# Patient Record
Sex: Female | Born: 1945 | Race: White | Hispanic: No | Marital: Married | Smoking: Never smoker
Health system: Southern US, Community
[De-identification: ages and names within clinical notes are randomized; demographics above are authoritative.]

## PROBLEM LIST (undated history)

## (undated) DIAGNOSIS — F32A Depression, unspecified: Secondary | ICD-10-CM

## (undated) DIAGNOSIS — F329 Major depressive disorder, single episode, unspecified: Secondary | ICD-10-CM

## (undated) DIAGNOSIS — S060X9A Concussion with loss of consciousness of unspecified duration, initial encounter: Secondary | ICD-10-CM

## (undated) DIAGNOSIS — C801 Malignant (primary) neoplasm, unspecified: Secondary | ICD-10-CM

## (undated) DIAGNOSIS — F4310 Post-traumatic stress disorder, unspecified: Secondary | ICD-10-CM

## (undated) DIAGNOSIS — I639 Cerebral infarction, unspecified: Secondary | ICD-10-CM

## (undated) DIAGNOSIS — I10 Essential (primary) hypertension: Secondary | ICD-10-CM

## (undated) DIAGNOSIS — G629 Polyneuropathy, unspecified: Secondary | ICD-10-CM

## (undated) DIAGNOSIS — G709 Myoneural disorder, unspecified: Secondary | ICD-10-CM

## (undated) DIAGNOSIS — E119 Type 2 diabetes mellitus without complications: Secondary | ICD-10-CM

## (undated) DIAGNOSIS — F419 Anxiety disorder, unspecified: Secondary | ICD-10-CM

## (undated) DIAGNOSIS — K219 Gastro-esophageal reflux disease without esophagitis: Secondary | ICD-10-CM

## (undated) DIAGNOSIS — A4902 Methicillin resistant Staphylococcus aureus infection, unspecified site: Secondary | ICD-10-CM

## (undated) DIAGNOSIS — R112 Nausea with vomiting, unspecified: Secondary | ICD-10-CM

## (undated) DIAGNOSIS — N189 Chronic kidney disease, unspecified: Secondary | ICD-10-CM

## (undated) DIAGNOSIS — Z9889 Other specified postprocedural states: Secondary | ICD-10-CM

## (undated) DIAGNOSIS — S060XAA Concussion with loss of consciousness status unknown, initial encounter: Secondary | ICD-10-CM

## (undated) DIAGNOSIS — J189 Pneumonia, unspecified organism: Secondary | ICD-10-CM

## (undated) DIAGNOSIS — M1990 Unspecified osteoarthritis, unspecified site: Secondary | ICD-10-CM

## (undated) DIAGNOSIS — K589 Irritable bowel syndrome without diarrhea: Secondary | ICD-10-CM

## (undated) DIAGNOSIS — J45909 Unspecified asthma, uncomplicated: Secondary | ICD-10-CM

## (undated) DIAGNOSIS — T8859XA Other complications of anesthesia, initial encounter: Secondary | ICD-10-CM

## (undated) DIAGNOSIS — T4145XA Adverse effect of unspecified anesthetic, initial encounter: Secondary | ICD-10-CM

## (undated) HISTORY — PX: CATARACT EXTRACTION: SUR2

## (undated) HISTORY — PX: ROTATOR CUFF REPAIR: SHX139

## (undated) HISTORY — PX: CARPAL TUNNEL RELEASE: SHX101

## (undated) HISTORY — PX: CHOLECYSTECTOMY: SHX55

## (undated) HISTORY — PX: SALPINGOOPHORECTOMY: SHX82

## (undated) HISTORY — PX: TRIGGER FINGER RELEASE: SHX641

## (undated) HISTORY — PX: OVARIAN CYST SURGERY: SHX726

## (undated) HISTORY — PX: DILATION AND CURETTAGE OF UTERUS: SHX78

## (undated) HISTORY — PX: ABDOMINAL HYSTERECTOMY: SHX81

## (undated) HISTORY — PX: TUBAL LIGATION: SHX77

---

## 2013-01-20 ENCOUNTER — Encounter (HOSPITAL_COMMUNITY): Payer: Self-pay | Admitting: Pharmacy Technician

## 2013-01-21 NOTE — H&P (Signed)
  Whitney [name] is an 67 y.o. female.    Chief Complaint: right shoulder pain/weakness  HPI: Pt is a 67 y.o. female complaining of right shoulder pain for multiple months. Pain had continually increased since the beginning. X-rays in the clinic show right shoulder rotator cuff tear. Pt has tried various conservative treatments which have failed to alleviate their symptoms, including injections and therapy. Various options are discussed with the patient. Risks, benefits and expectations were discussed with the patient. Patient understand the risks, benefits and expectations and wishes to proceed with surgery.   PCP:  No primary provider on file.  D/C Plans:  Home   PMH: No past medical history on file.  PSH: No past surgical history on file.  Social History:  has no tobacco, alcohol, and drug history on file.  Allergies:  Allergies  Allergen Reactions  . Codeine Nausea And Vomiting and Other (See Comments)    disorientation  . Erythromycin Hives  . Morphine And Related Rash    Medications: No current facility-administered medications for this encounter.   Current Outpatient Prescriptions  Medication Sig Dispense Refill  . albuterol (PROVENTIL HFA;VENTOLIN HFA) 108 (90 BASE) MCG/ACT inhaler Inhale into the lungs every 6 (six) hours as needed for wheezing or shortness of breath.      Marland Kitchen CALCIUM PO Take 1 tablet by mouth daily.      . celecoxib (CELEBREX) 200 MG capsule Take 200 mg by mouth daily.      . Cholecalciferol (VITAMIN D PO) Take 1 tablet by mouth daily.      . citalopram (CELEXA) 20 MG tablet Take 20 mg by mouth daily.      . diclofenac sodium (VOLTAREN) 1 % GEL Apply 2 g topically 4 (four) times daily as needed (for pain).      . Fish Oil-Krill Oil (KRILL OIL PLUS PO) Take 1 capsule by mouth daily.      . insulin aspart (NOVOLOG) 100 UNIT/ML injection Inject 20 Units into the skin daily.      . insulin glargine (LANTUS) 100 UNIT/ML injection Inject 110 Units into the  skin at bedtime.      Marland Kitchen lisinopril-hydrochlorothiazide (PRINZIDE,ZESTORETIC) 20-12.5 MG per tablet Take 1 tablet by mouth daily.      Marland Kitchen omeprazole (PRILOSEC) 40 MG capsule Take 40 mg by mouth daily.      . pioglitazone-metformin (ACTOPLUS MET) 15-850 MG per tablet Take 1 tablet by mouth 2 (two) times daily with a meal.      . rosuvastatin (CRESTOR) 20 MG tablet Take 20 mg by mouth daily.        No results found for this or any previous visit (from the past 48 hour(s)). No results found.  ROS: Pain with rom of the right upper extremity  Physical Exam:  Alert and oriented 67 y.o. female in no acute distress Cranial nerves 2-12 intact Cervical spine: full rom with no tenderness, nv intact distally Chest: active breath sounds bilaterally, no wheeze rhonchi or rales Heart: regular rate and rhythm, no murmur Abd: non tender non distended with active bowel sounds Hip is stable with rom  Moderate weakness with ER and IR nv intact distally No rashes or edema  Assessment/Plan Assessment: right shoulder rotator cuff tear   Plan: Patient will undergo a right shoulder rotator cuff repair by Dr. Veverly Fells at [facility]. Risks benefits and expectations were discussed with the patient. Patient understand risks, benefits and expectations and wishes to proceed.

## 2013-01-30 ENCOUNTER — Encounter (HOSPITAL_COMMUNITY): Payer: Self-pay | Admitting: *Deleted

## 2013-01-30 MED ORDER — CEFAZOLIN SODIUM-DEXTROSE 2-3 GM-% IV SOLR
2.0000 g | INTRAVENOUS | Status: AC
  Administered 2013-01-31: 2 g via INTRAVENOUS
  Filled 2013-01-30: qty 50

## 2013-01-31 ENCOUNTER — Encounter (HOSPITAL_COMMUNITY): Payer: Self-pay | Admitting: *Deleted

## 2013-01-31 ENCOUNTER — Observation Stay (HOSPITAL_COMMUNITY)
Admission: RE | Admit: 2013-01-31 | Discharge: 2013-02-02 | Disposition: A | Discharge status: Home / Self Care | Payer: Medicare HMO | Source: Ambulatory Visit | Attending: Orthopedic Surgery | Admitting: Orthopedic Surgery

## 2013-01-31 ENCOUNTER — Encounter (HOSPITAL_COMMUNITY)
Admission: RE | Disposition: A | Discharge status: Home / Self Care | Payer: Self-pay | Source: Ambulatory Visit | Attending: Orthopedic Surgery

## 2013-01-31 ENCOUNTER — Ambulatory Visit (HOSPITAL_COMMUNITY): Payer: Medicare HMO | Admitting: Anesthesiology

## 2013-01-31 ENCOUNTER — Encounter (HOSPITAL_COMMUNITY): Payer: Medicare HMO | Admitting: Anesthesiology

## 2013-01-31 DIAGNOSIS — I10 Essential (primary) hypertension: Secondary | ICD-10-CM | POA: Insufficient documentation

## 2013-01-31 DIAGNOSIS — M19019 Primary osteoarthritis, unspecified shoulder: Secondary | ICD-10-CM | POA: Insufficient documentation

## 2013-01-31 DIAGNOSIS — J45909 Unspecified asthma, uncomplicated: Secondary | ICD-10-CM | POA: Insufficient documentation

## 2013-01-31 DIAGNOSIS — F4310 Post-traumatic stress disorder, unspecified: Secondary | ICD-10-CM | POA: Insufficient documentation

## 2013-01-31 DIAGNOSIS — F411 Generalized anxiety disorder: Secondary | ICD-10-CM | POA: Insufficient documentation

## 2013-01-31 DIAGNOSIS — S43429A Sprain of unspecified rotator cuff capsule, initial encounter: Secondary | ICD-10-CM

## 2013-01-31 DIAGNOSIS — Z794 Long term (current) use of insulin: Secondary | ICD-10-CM | POA: Insufficient documentation

## 2013-01-31 DIAGNOSIS — E119 Type 2 diabetes mellitus without complications: Secondary | ICD-10-CM | POA: Insufficient documentation

## 2013-01-31 DIAGNOSIS — F3289 Other specified depressive episodes: Secondary | ICD-10-CM | POA: Insufficient documentation

## 2013-01-31 DIAGNOSIS — M75120 Complete rotator cuff tear or rupture of unspecified shoulder, not specified as traumatic: Principal | ICD-10-CM | POA: Insufficient documentation

## 2013-01-31 DIAGNOSIS — K219 Gastro-esophageal reflux disease without esophagitis: Secondary | ICD-10-CM | POA: Insufficient documentation

## 2013-01-31 DIAGNOSIS — F329 Major depressive disorder, single episode, unspecified: Secondary | ICD-10-CM | POA: Insufficient documentation

## 2013-01-31 DIAGNOSIS — Z5333 Arthroscopic surgical procedure converted to open procedure: Secondary | ICD-10-CM | POA: Insufficient documentation

## 2013-01-31 HISTORY — DX: Major depressive disorder, single episode, unspecified: F32.9

## 2013-01-31 HISTORY — DX: Cerebral infarction, unspecified: I63.9

## 2013-01-31 HISTORY — DX: Polyneuropathy, unspecified: G62.9

## 2013-01-31 HISTORY — DX: Anxiety disorder, unspecified: F41.9

## 2013-01-31 HISTORY — DX: Depression, unspecified: F32.A

## 2013-01-31 HISTORY — DX: Essential (primary) hypertension: I10

## 2013-01-31 HISTORY — DX: Type 2 diabetes mellitus without complications: E11.9

## 2013-01-31 HISTORY — DX: Concussion with loss of consciousness status unknown, initial encounter: S06.0XAA

## 2013-01-31 HISTORY — DX: Methicillin resistant Staphylococcus aureus infection, unspecified site: A49.02

## 2013-01-31 HISTORY — DX: Other specified postprocedural states: Z98.890

## 2013-01-31 HISTORY — DX: Malignant (primary) neoplasm, unspecified: C80.1

## 2013-01-31 HISTORY — DX: Post-traumatic stress disorder, unspecified: F43.10

## 2013-01-31 HISTORY — DX: Other complications of anesthesia, initial encounter: T88.59XA

## 2013-01-31 HISTORY — DX: Concussion with loss of consciousness of unspecified duration, initial encounter: S06.0X9A

## 2013-01-31 HISTORY — DX: Adverse effect of unspecified anesthetic, initial encounter: T41.45XA

## 2013-01-31 HISTORY — DX: Irritable bowel syndrome, unspecified: K58.9

## 2013-01-31 HISTORY — PX: SHOULDER ARTHROSCOPY WITH SUBACROMIAL DECOMPRESSION AND OPEN ROTATOR C: SHX5688

## 2013-01-31 HISTORY — DX: Other specified postprocedural states: R11.2

## 2013-01-31 HISTORY — DX: Gastro-esophageal reflux disease without esophagitis: K21.9

## 2013-01-31 LAB — NO BLOOD PRODUCTS

## 2013-01-31 LAB — GLUCOSE, CAPILLARY
GLUCOSE-CAPILLARY: 164 mg/dL — AB (ref 70–99)
Glucose-Capillary: 189 mg/dL — ABNORMAL HIGH (ref 70–99)
Glucose-Capillary: 190 mg/dL — ABNORMAL HIGH (ref 70–99)
Glucose-Capillary: 287 mg/dL — ABNORMAL HIGH (ref 70–99)

## 2013-01-31 LAB — BASIC METABOLIC PANEL
BUN: 23 mg/dL (ref 6–23)
CALCIUM: 9.8 mg/dL (ref 8.4–10.5)
CHLORIDE: 100 meq/L (ref 96–112)
CO2: 24 meq/L (ref 19–32)
CREATININE: 0.96 mg/dL (ref 0.50–1.10)
GFR calc Af Amer: 69 mL/min — ABNORMAL LOW (ref >90)
GFR calc non Af Amer: 59 mL/min — ABNORMAL LOW (ref >90)
GLUCOSE: 198 mg/dL — AB (ref 70–99)
Potassium: 4.4 mEq/L (ref 3.7–5.3)
Sodium: 140 mEq/L (ref 137–147)

## 2013-01-31 LAB — CBC
HEMATOCRIT: 41.2 % (ref 36.0–46.0)
Hemoglobin: 14 g/dL (ref 12.0–15.0)
MCH: 30.2 pg (ref 26.0–34.0)
MCHC: 34 g/dL (ref 30.0–36.0)
MCV: 89 fL (ref 78.0–100.0)
PLATELETS: 151 10*3/uL (ref 150–400)
RBC: 4.63 MIL/uL (ref 3.87–5.11)
RDW: 13.2 % (ref 11.5–15.5)
WBC: 5.8 10*3/uL (ref 4.0–10.5)

## 2013-01-31 SURGERY — SHOULDER ARTHROSCOPY WITH SUBACROMIAL DECOMPRESSION AND OPEN ROTATOR CUFF REPAIR, OPEN BICEPS TENDON REPAIR
Anesthesia: General | Site: Shoulder | Laterality: Right

## 2013-01-31 MED ORDER — ACETAMINOPHEN 650 MG RE SUPP: 650.0000 mg | Freq: Four times a day (QID) | RECTAL | Status: DC | PRN

## 2013-01-31 MED ORDER — ATORVASTATIN CALCIUM 40 MG PO TABS
40.0000 mg | ORAL_TABLET | Freq: Every day | ORAL | Status: DC
  Administered 2013-01-31 – 2013-02-01 (×2): 40 mg via ORAL
  Filled 2013-01-31 (×3): qty 1

## 2013-01-31 MED ORDER — ROCURONIUM BROMIDE 100 MG/10ML IV SOLN
INTRAVENOUS | Status: DC | PRN
  Administered 2013-01-31: 40 mg via INTRAVENOUS

## 2013-01-31 MED ORDER — METHOCARBAMOL 500 MG PO TABS: 500.0000 mg | ORAL_TABLET | Freq: Three times a day (TID) | ORAL | Status: DC | PRN

## 2013-01-31 MED ORDER — ALBUTEROL SULFATE HFA 108 (90 BASE) MCG/ACT IN AERS: 1.0000 | INHALATION_SPRAY | Freq: Four times a day (QID) | RESPIRATORY_TRACT | Status: DC | PRN

## 2013-01-31 MED ORDER — HYDROMORPHONE HCL PF 1 MG/ML IJ SOLN: 0.2500 mg | INTRAMUSCULAR | Status: DC | PRN

## 2013-01-31 MED ORDER — MENTHOL 3 MG MT LOZG: 1.0000 | LOZENGE | OROMUCOSAL | Status: DC | PRN

## 2013-01-31 MED ORDER — BUPIVACAINE-EPINEPHRINE PF 0.5-1:200000 % IJ SOLN
INTRAMUSCULAR | Status: DC | PRN
  Administered 2013-01-31: 30 mL

## 2013-01-31 MED ORDER — SCOPOLAMINE 1 MG/3DAYS TD PT72
MEDICATED_PATCH | TRANSDERMAL | Status: AC
  Filled 2013-01-31: qty 1

## 2013-01-31 MED ORDER — ONDANSETRON HCL 4 MG/2ML IJ SOLN: 4.0000 mg | Freq: Four times a day (QID) | INTRAMUSCULAR | Status: DC | PRN

## 2013-01-31 MED ORDER — METOCLOPRAMIDE HCL 5 MG/ML IJ SOLN: 5.0000 mg | Freq: Three times a day (TID) | INTRAMUSCULAR | Status: DC | PRN

## 2013-01-31 MED ORDER — FENTANYL CITRATE 0.05 MG/ML IJ SOLN
INTRAMUSCULAR | Status: DC | PRN
  Administered 2013-01-31: 50 ug via INTRAVENOUS
  Administered 2013-01-31: 100 ug via INTRAVENOUS

## 2013-01-31 MED ORDER — EPHEDRINE SULFATE 50 MG/ML IJ SOLN
INTRAMUSCULAR | Status: DC | PRN
  Administered 2013-01-31 (×2): 10 mg via INTRAVENOUS

## 2013-01-31 MED ORDER — PROMETHAZINE HCL 25 MG/ML IJ SOLN: 6.2500 mg | INTRAMUSCULAR | Status: DC | PRN

## 2013-01-31 MED ORDER — OXYCODONE HCL 5 MG/5ML PO SOLN: 5.0000 mg | Freq: Once | ORAL | Status: DC | PRN

## 2013-01-31 MED ORDER — MIDAZOLAM HCL 2 MG/2ML IJ SOLN
INTRAMUSCULAR | Status: AC
  Administered 2013-01-31: 1 mg
  Filled 2013-01-31: qty 2

## 2013-01-31 MED ORDER — SODIUM CHLORIDE 0.9 % IR SOLN
Status: DC | PRN
  Administered 2013-01-31: 3000 mL

## 2013-01-31 MED ORDER — MEPERIDINE HCL 25 MG/ML IJ SOLN: 6.2500 mg | INTRAMUSCULAR | Status: DC | PRN

## 2013-01-31 MED ORDER — OXYCODONE-ACETAMINOPHEN 5-325 MG PO TABS
1.0000 | ORAL_TABLET | ORAL | Status: DC | PRN
  Administered 2013-01-31 – 2013-02-01 (×3): 2 via ORAL
  Administered 2013-02-01: 1 via ORAL
  Administered 2013-02-01 – 2013-02-02 (×2): 2 via ORAL
  Administered 2013-02-02: 1 via ORAL
  Filled 2013-01-31 (×3): qty 2
  Filled 2013-01-31: qty 1
  Filled 2013-01-31 (×2): qty 2
  Filled 2013-01-31: qty 1

## 2013-01-31 MED ORDER — PROPOFOL 10 MG/ML IV BOLUS
INTRAVENOUS | Status: DC | PRN
  Administered 2013-01-31: 150 mg via INTRAVENOUS

## 2013-01-31 MED ORDER — INSULIN ASPART 100 UNIT/ML ~~LOC~~ SOLN: 0.0000 [IU] | Freq: Every day | SUBCUTANEOUS | Status: DC

## 2013-01-31 MED ORDER — INSULIN ASPART 100 UNIT/ML ~~LOC~~ SOLN
6.0000 [IU] | Freq: Three times a day (TID) | SUBCUTANEOUS | Status: DC
  Administered 2013-01-31 – 2013-02-02 (×4): 6 [IU] via SUBCUTANEOUS

## 2013-01-31 MED ORDER — MIDAZOLAM HCL 5 MG/5ML IJ SOLN
INTRAMUSCULAR | Status: DC | PRN
  Administered 2013-01-31: 1 mg via INTRAVENOUS

## 2013-01-31 MED ORDER — METHOCARBAMOL 100 MG/ML IJ SOLN
500.0000 mg | Freq: Four times a day (QID) | INTRAMUSCULAR | Status: DC | PRN
  Filled 2013-01-31: qty 5

## 2013-01-31 MED ORDER — ONDANSETRON HCL 4 MG PO TABS: 4.0000 mg | ORAL_TABLET | Freq: Four times a day (QID) | ORAL | Status: DC | PRN

## 2013-01-31 MED ORDER — 0.9 % SODIUM CHLORIDE (POUR BTL) OPTIME
TOPICAL | Status: DC | PRN
  Administered 2013-01-31: 1000 mL

## 2013-01-31 MED ORDER — GLYCOPYRROLATE 0.2 MG/ML IJ SOLN
INTRAMUSCULAR | Status: DC | PRN
  Administered 2013-01-31: .4 mg via INTRAVENOUS

## 2013-01-31 MED ORDER — FENTANYL CITRATE 0.05 MG/ML IJ SOLN
INTRAMUSCULAR | Status: AC
  Administered 2013-01-31: 50 ug
  Filled 2013-01-31: qty 2

## 2013-01-31 MED ORDER — INSULIN ASPART 100 UNIT/ML ~~LOC~~ SOLN: 20.0000 [IU] | Freq: Every day | SUBCUTANEOUS | Status: DC

## 2013-01-31 MED ORDER — SODIUM CHLORIDE 0.9 % IV SOLN
INTRAVENOUS | Status: DC
  Administered 2013-01-31: 50 mL/h via INTRAVENOUS

## 2013-01-31 MED ORDER — FENTANYL CITRATE 0.05 MG/ML IJ SOLN: 50.0000 ug | INTRAMUSCULAR | Status: DC | PRN

## 2013-01-31 MED ORDER — BUPIVACAINE-EPINEPHRINE 0.25% -1:200000 IJ SOLN
INTRAMUSCULAR | Status: DC | PRN
  Administered 2013-01-31: 10 mL

## 2013-01-31 MED ORDER — BUPIVACAINE-EPINEPHRINE (PF) 0.25% -1:200000 IJ SOLN
INTRAMUSCULAR | Status: AC
  Filled 2013-01-31: qty 30

## 2013-01-31 MED ORDER — HYDROMORPHONE HCL PF 1 MG/ML IJ SOLN
0.5000 mg | INTRAMUSCULAR | Status: DC | PRN
  Administered 2013-01-31 – 2013-02-01 (×3): 1 mg via INTRAVENOUS
  Filled 2013-01-31 (×3): qty 1

## 2013-01-31 MED ORDER — LACTATED RINGERS IV SOLN
INTRAVENOUS | Status: DC
  Administered 2013-01-31 (×2): via INTRAVENOUS

## 2013-01-31 MED ORDER — VANCOMYCIN HCL IN DEXTROSE 1-5 GM/200ML-% IV SOLN
INTRAVENOUS | Status: AC
  Filled 2013-01-31: qty 200

## 2013-01-31 MED ORDER — NEOSTIGMINE METHYLSULFATE 1 MG/ML IJ SOLN
INTRAMUSCULAR | Status: DC | PRN
  Administered 2013-01-31: 3 mg via INTRAVENOUS

## 2013-01-31 MED ORDER — PHENOL 1.4 % MT LIQD: 1.0000 | OROMUCOSAL | Status: DC | PRN

## 2013-01-31 MED ORDER — WHITE PETROLATUM GEL
Status: AC
  Administered 2013-01-31: 0.2
  Filled 2013-01-31: qty 5

## 2013-01-31 MED ORDER — PIOGLITAZONE HCL 15 MG PO TABS
15.0000 mg | ORAL_TABLET | Freq: Two times a day (BID) | ORAL | Status: DC
  Administered 2013-01-31 – 2013-02-02 (×4): 15 mg via ORAL
  Filled 2013-01-31 (×6): qty 1

## 2013-01-31 MED ORDER — METHOCARBAMOL 500 MG PO TABS
500.0000 mg | ORAL_TABLET | Freq: Four times a day (QID) | ORAL | Status: DC | PRN
  Administered 2013-01-31 – 2013-02-02 (×4): 500 mg via ORAL
  Filled 2013-01-31 (×4): qty 1

## 2013-01-31 MED ORDER — INSULIN ASPART 100 UNIT/ML ~~LOC~~ SOLN
0.0000 [IU] | Freq: Three times a day (TID) | SUBCUTANEOUS | Status: DC
  Administered 2013-01-31: 11 [IU] via SUBCUTANEOUS
  Administered 2013-02-01: 4 [IU] via SUBCUTANEOUS
  Administered 2013-02-01 – 2013-02-02 (×3): 11 [IU] via SUBCUTANEOUS
  Administered 2013-02-02: 7 [IU] via SUBCUTANEOUS

## 2013-01-31 MED ORDER — PNEUMOCOCCAL VAC POLYVALENT 25 MCG/0.5ML IJ INJ
0.5000 mL | INJECTION | INTRAMUSCULAR | Status: DC
  Filled 2013-01-31: qty 0.5

## 2013-01-31 MED ORDER — CITALOPRAM HYDROBROMIDE 20 MG PO TABS
20.0000 mg | ORAL_TABLET | Freq: Every day | ORAL | Status: DC
  Administered 2013-01-31 – 2013-02-02 (×3): 20 mg via ORAL
  Filled 2013-01-31 (×3): qty 1

## 2013-01-31 MED ORDER — CHLORHEXIDINE GLUCONATE 4 % EX LIQD: 60.0000 mL | Freq: Once | CUTANEOUS | Status: DC

## 2013-01-31 MED ORDER — PIOGLITAZONE HCL-METFORMIN HCL 15-850 MG PO TABS: 1.0000 | ORAL_TABLET | Freq: Two times a day (BID) | ORAL | Status: DC

## 2013-01-31 MED ORDER — ONDANSETRON HCL 4 MG/2ML IJ SOLN
INTRAMUSCULAR | Status: DC | PRN
  Administered 2013-01-31: 4 mg via INTRAVENOUS

## 2013-01-31 MED ORDER — MIDAZOLAM HCL 2 MG/2ML IJ SOLN: 0.5000 mg | Freq: Once | INTRAMUSCULAR | Status: DC | PRN

## 2013-01-31 MED ORDER — BISACODYL 10 MG RE SUPP: 10.0000 mg | Freq: Every day | RECTAL | Status: DC | PRN

## 2013-01-31 MED ORDER — PHENYLEPHRINE HCL 10 MG/ML IJ SOLN
INTRAMUSCULAR | Status: DC | PRN
  Administered 2013-01-31: 120 ug via INTRAVENOUS
  Administered 2013-01-31: 40 ug via INTRAVENOUS
  Administered 2013-01-31 (×3): 80 ug via INTRAVENOUS
  Administered 2013-01-31: 120 ug via INTRAVENOUS
  Administered 2013-01-31: 80 ug via INTRAVENOUS
  Administered 2013-01-31: 40 ug via INTRAVENOUS
  Administered 2013-01-31 (×2): 80 ug via INTRAVENOUS

## 2013-01-31 MED ORDER — MIDAZOLAM HCL 2 MG/2ML IJ SOLN
1.0000 mg | INTRAMUSCULAR | Status: DC | PRN
Start: 2013-01-31 — End: 2013-01-31

## 2013-01-31 MED ORDER — VANCOMYCIN HCL 1000 MG IV SOLR
1000.0000 mg | Freq: Two times a day (BID) | INTRAVENOUS | Status: DC
  Administered 2013-01-31: 1000 mg via INTRAVENOUS

## 2013-01-31 MED ORDER — PANTOPRAZOLE SODIUM 40 MG PO TBEC
80.0000 mg | DELAYED_RELEASE_TABLET | Freq: Every day | ORAL | Status: DC
  Administered 2013-01-31 – 2013-02-02 (×3): 80 mg via ORAL
  Filled 2013-01-31 (×3): qty 2

## 2013-01-31 MED ORDER — OXYCODONE HCL 5 MG PO TABS: 5.0000 mg | ORAL_TABLET | Freq: Once | ORAL | Status: DC | PRN

## 2013-01-31 MED ORDER — LISINOPRIL-HYDROCHLOROTHIAZIDE 20-12.5 MG PO TABS: 1.0000 | ORAL_TABLET | Freq: Every day | ORAL | Status: DC

## 2013-01-31 MED ORDER — SCOPOLAMINE 1 MG/3DAYS TD PT72
1.0000 | MEDICATED_PATCH | TRANSDERMAL | Status: DC
  Administered 2013-01-31: 1.5 mg via TRANSDERMAL

## 2013-01-31 MED ORDER — VANCOMYCIN HCL 1000 MG IV SOLR
1000.0000 mg | Freq: Once | INTRAVENOUS | Status: DC
  Filled 2013-01-31: qty 1000

## 2013-01-31 MED ORDER — LISINOPRIL 20 MG PO TABS
20.0000 mg | ORAL_TABLET | Freq: Every day | ORAL | Status: DC
  Administered 2013-02-01 – 2013-02-02 (×2): 20 mg via ORAL
  Filled 2013-01-31 (×2): qty 1

## 2013-01-31 MED ORDER — OXYCODONE-ACETAMINOPHEN 5-325 MG PO TABS: 1.0000 | ORAL_TABLET | ORAL | Status: DC | PRN

## 2013-01-31 MED ORDER — HYDROCHLOROTHIAZIDE 12.5 MG PO CAPS
12.5000 mg | ORAL_CAPSULE | Freq: Every day | ORAL | Status: DC
  Administered 2013-02-01 – 2013-02-02 (×2): 12.5 mg via ORAL
  Filled 2013-01-31 (×2): qty 1

## 2013-01-31 MED ORDER — LIDOCAINE HCL (CARDIAC) 20 MG/ML IV SOLN
INTRAVENOUS | Status: DC | PRN
  Administered 2013-01-31: 20 mg via INTRAVENOUS

## 2013-01-31 MED ORDER — VANCOMYCIN HCL IN DEXTROSE 1-5 GM/200ML-% IV SOLN
1000.0000 mg | Freq: Two times a day (BID) | INTRAVENOUS | Status: AC
  Administered 2013-01-31: 1000 mg via INTRAVENOUS
  Filled 2013-01-31: qty 200

## 2013-01-31 MED ORDER — CELECOXIB 200 MG PO CAPS
200.0000 mg | ORAL_CAPSULE | Freq: Every day | ORAL | Status: DC
  Administered 2013-02-01 – 2013-02-02 (×2): 200 mg via ORAL
  Filled 2013-01-31 (×3): qty 1

## 2013-01-31 MED ORDER — INSULIN GLARGINE 100 UNIT/ML ~~LOC~~ SOLN
110.0000 [IU] | Freq: Every day | SUBCUTANEOUS | Status: DC
  Administered 2013-01-31 – 2013-02-01 (×2): 110 [IU] via SUBCUTANEOUS
  Filled 2013-01-31 (×3): qty 1.1

## 2013-01-31 MED ORDER — METFORMIN HCL 850 MG PO TABS
850.0000 mg | ORAL_TABLET | Freq: Two times a day (BID) | ORAL | Status: DC
  Administered 2013-01-31 – 2013-02-02 (×4): 850 mg via ORAL
  Filled 2013-01-31 (×6): qty 1

## 2013-01-31 MED ORDER — ACETAMINOPHEN 325 MG PO TABS: 650.0000 mg | ORAL_TABLET | Freq: Four times a day (QID) | ORAL | Status: DC | PRN

## 2013-01-31 MED ORDER — METOCLOPRAMIDE HCL 10 MG PO TABS: 5.0000 mg | ORAL_TABLET | Freq: Three times a day (TID) | ORAL | Status: DC | PRN

## 2013-01-31 SURGICAL SUPPLY — 63 items
ANCHOR ALL- SUT RC 2 SUT Y-K (Anchor) ×1 IMPLANT
ANCHOR ALL-SUT FLEX 1.3 Y-KNOT (Anchor) ×2 IMPLANT
ANCHOR ALL-SUT RC 2 SUT Y-K (Anchor) ×1 IMPLANT
BIT DRILL 1.3M DISPOSABLE (BIT) ×2 IMPLANT
BLADE SURG 11 STRL SS (BLADE) ×2 IMPLANT
BUR OVAL 4.0 (BURR) IMPLANT
CLOTH BEACON ORANGE TIMEOUT ST (SAFETY) ×2 IMPLANT
CLSR STERI-STRIP ANTIMIC 1/2X4 (GAUZE/BANDAGES/DRESSINGS) ×2 IMPLANT
COVER SURGICAL LIGHT HANDLE (MISCELLANEOUS) ×2 IMPLANT
DRAPE INCISE IOBAN 66X45 STRL (DRAPES) ×2 IMPLANT
DRAPE STERI 35X30 U-POUCH (DRAPES) ×2 IMPLANT
DRAPE U-SHAPE 47X51 STRL (DRAPES) ×2 IMPLANT
DRSG EMULSION OIL 3X3 NADH (GAUZE/BANDAGES/DRESSINGS) ×2 IMPLANT
DRSG PAD ABDOMINAL 8X10 ST (GAUZE/BANDAGES/DRESSINGS) ×4 IMPLANT
DURAPREP 26ML APPLICATOR (WOUND CARE) ×4 IMPLANT
ELECT REM PT RETURN 9FT ADLT (ELECTROSURGICAL)
ELECTRODE REM PT RTRN 9FT ADLT (ELECTROSURGICAL) IMPLANT
GLOVE BIOGEL PI IND STRL 6.5 (GLOVE) ×1 IMPLANT
GLOVE BIOGEL PI INDICATOR 6.5 (GLOVE) ×1
GLOVE BIOGEL PI ORTHO PRO 7.5 (GLOVE) ×1
GLOVE BIOGEL PI ORTHO PRO SZ8 (GLOVE) ×1
GLOVE ECLIPSE 7.0 STRL STRAW (GLOVE) ×2 IMPLANT
GLOVE ORTHO TXT STRL SZ7.5 (GLOVE) ×4 IMPLANT
GLOVE PI ORTHO PRO STRL 7.5 (GLOVE) ×1 IMPLANT
GLOVE PI ORTHO PRO STRL SZ8 (GLOVE) ×1 IMPLANT
GLOVE SURG ORTHO 8.5 STRL (GLOVE) ×2 IMPLANT
GLOVE SURG SS PI 6.5 STRL IVOR (GLOVE) ×2 IMPLANT
GOWN L4 LG 24 PK N/S (GOWN DISPOSABLE) ×2 IMPLANT
GOWN STRL REIN XL XLG (GOWN DISPOSABLE) ×8 IMPLANT
GOWN STRL REUS W/TWL XL LVL4 (GOWN DISPOSABLE) ×4 IMPLANT
KIT BASIN OR (CUSTOM PROCEDURE TRAY) ×2 IMPLANT
KIT ROOM TURNOVER OR (KITS) ×2 IMPLANT
MANIFOLD NEPTUNE II (INSTRUMENTS) ×2 IMPLANT
NDL SUT .5 MAYO 1.404X.05X (NEEDLE) ×1 IMPLANT
NDL SUT 6 .5 CRC .975X.05 MAYO (NEEDLE) ×1 IMPLANT
NEEDLE HYPO 25GX1X1/2 BEV (NEEDLE) ×2 IMPLANT
NEEDLE MAYO TAPER (NEEDLE) ×2
NEEDLE SPNL 18GX3.5 QUINCKE PK (NEEDLE) ×2 IMPLANT
NS IRRIG 1000ML POUR BTL (IV SOLUTION) ×2 IMPLANT
PACK SHOULDER (CUSTOM PROCEDURE TRAY) ×2 IMPLANT
PAD ABD 8X10 STRL (GAUZE/BANDAGES/DRESSINGS) ×4 IMPLANT
PAD ARMBOARD 7.5X6 YLW CONV (MISCELLANEOUS) ×4 IMPLANT
RESECTOR FULL RADIUS 4.2MM (BLADE) ×2 IMPLANT
SET ARTHROSCOPY TUBING (MISCELLANEOUS) ×1
SET ARTHROSCOPY TUBING LN (MISCELLANEOUS) ×1 IMPLANT
SLING ARM FOAM STRAP LRG (SOFTGOODS) IMPLANT
SLING ARM FOAM STRAP MED (SOFTGOODS) IMPLANT
SPONGE GAUZE 4X4 12PLY (GAUZE/BANDAGES/DRESSINGS) ×2 IMPLANT
STRIP CLOSURE SKIN 1/2X4 (GAUZE/BANDAGES/DRESSINGS) ×2 IMPLANT
SUT FIBERWIRE #2 38 T-5 BLUE (SUTURE) ×2
SUT MNCRL AB 3-0 PS2 18 (SUTURE) ×2 IMPLANT
SUT VIC AB 0 CT2 27 (SUTURE) ×2 IMPLANT
SUT VIC AB 2-0 CT1 27 (SUTURE) ×1
SUT VIC AB 2-0 CT1 TAPERPNT 27 (SUTURE) ×1 IMPLANT
SUT VICRYL 0 CT 1 36IN (SUTURE) IMPLANT
SUTURE FIBERWR #2 38 T-5 BLUE (SUTURE) ×1 IMPLANT
SYR CONTROL 10ML LL (SYRINGE) ×2 IMPLANT
TAPE CLOTH SURG 6X10 WHT LF (GAUZE/BANDAGES/DRESSINGS) ×2 IMPLANT
TOWEL OR 17X24 6PK STRL BLUE (TOWEL DISPOSABLE) ×2 IMPLANT
TOWEL OR 17X26 10 PK STRL BLUE (TOWEL DISPOSABLE) ×2 IMPLANT
TUBE CONNECTING 12X1/4 (SUCTIONS) ×2 IMPLANT
WAND 90 DEG TURBOVAC W/CORD (SURGICAL WAND) ×2 IMPLANT
WATER STERILE IRR 1000ML POUR (IV SOLUTION) ×2 IMPLANT

## 2013-01-31 NOTE — Anesthesia Procedure Notes (Signed)
Anesthesia Regional Block:  Interscalene brachial plexus block  Pre-Anesthetic Checklist: ,, timeout performed, Correct Patient, Correct Site, Correct Laterality, Correct Procedure, Correct Position, site marked, Risks and benefits discussed,  Surgical consent,  Pre-op evaluation,  At surgeon's request and post-op pain management  Laterality: Right  Prep: chloraprep       Needles:  Injection technique: Single-shot  Needle Type: Echogenic Stimulator Needle      Needle Gauge: 22 and 22 G    Additional Needles:  Procedures: nerve stimulator Interscalene brachial plexus block  Nerve Stimulator or Paresthesia:  Response: forearm twitch, 0.45 mA, 0.1 ms,   Additional Responses:   Narrative:  Start time: 01/31/2013 9:11 AM End time: 01/31/2013 9:18 AM Injection made incrementally with aspirations every 5 mL.  Performed by: Personally  Anesthesiologist: Jenita Seashore, MD  Additional Notes: Pt identified in Holding room.  Monitors applied. Working IV access confirmed. Sterile prep R neck.  #22ga PNS to forearm twitch at 0.45mA threshold.  30cc 0.5% Bupivacaine with 1:200k epi injected incrementally after negative test dose.  Patient asymptomatic, VSS, no heme aspirated, tolerated well.  Jenita Seashore, MD

## 2013-01-31 NOTE — Transfer of Care (Signed)
Immediate Anesthesia Transfer of Care Note  Patient: Whitney [name]  Procedure(s) Performed: Procedure(s): RIGHT SHOULDER ARTHROSCOPY WITH SUBACROMIAL DECOMPRESSION AND MINI OPEN ROTATOR CUFF REPAIR, OPEN BICEP TENDODESIS AND OPEN DISTAL CLAVICLE RESECTION (Right)  Patient Location: PACU  Anesthesia Type:General  Level of Consciousness: awake, alert  and oriented  Airway & Oxygen Therapy: Patient Spontanous Breathing and Patient connected to nasal cannula oxygen  Post-op Assessment: Report given to PACU RN and Post -op Vital signs reviewed and stable  Post vital signs: Reviewed and stable  Complications: No apparent anesthesia complications

## 2013-01-31 NOTE — Evaluation (Addendum)
Occupational Therapy Evaluation Patient Details Name: Whitney [name] MRN: [id number] DOB: 1945/06/13 Today's Date: 01/31/2013 Time: [id number] OT Time Calculation (min): 25 min  OT Assessment / Plan / Recommendation History of present illness RIGHT SHOULDER ARTHROSCOPY WITH SUBACROMIAL DECOMPRESSION AND MINI OPEN ROTATOR CUFF REPAIR, OPEN BICEP TENDODESIS AND OPEN DISTAL CLAVICLE RESECTION    Clinical Impression   This 68 yo female presents to acute OT s/p above, will benefit from continued acute OT.    OT Assessment  Patient needs continued OT Services    Follow Up Recommendations   (follow up per MD)       Equipment Recommendations  None recommended by OT       Frequency  Min 3X/week    Precautions / Restrictions Precautions Precautions: Shoulder Type of Shoulder Precautions: NO ABDUCTION except for abduction pillow or gravity assisted for UBB/D, Lap slides, ER/IR wand, assisted reclined forward elevation, Active/Assist Shoulder ROM, Within limits of pain, ADL education Reason; Sling education; Pendulum exercises Elbow; wrist, hand active ROM. May remove sling and hug a pillow while in bed, use sling while up ambulating Shoulder Interventions: Don joy ultra sling;Shoulder abduction pillow;Off for dressing/bathing/exercises (off in bed with pillow between her body and arm to serve as abductor) Required Braces or Orthoses: Sling Restrictions Weight Bearing Restrictions: Yes RUE Weight Bearing: Non weight bearing   Pertinent Vitals/Pain None, due nerve block not worn off       OT Diagnosis: Generalized weakness  OT Problem List: Decreased strength;Decreased range of motion;Impaired UE functional use OT Treatment Interventions: Self-care/ADL training;Therapeutic exercise;Balance training;Patient/family education   OT Goals(Current goals can be found in the care plan section) Acute Rehab OT Goals Patient Stated Goal: Home tomorrow OT Goal Formulation: With patient Time For  Goal Achievement: 02/07/13 Potential to Achieve Goals: Good  Visit Information  Last OT Received On: 01/31/13 Assistance Needed: +1 History of Present Illness: RIGHT SHOULDER ARTHROSCOPY WITH SUBACROMIAL DECOMPRESSION AND MINI OPEN ROTATOR CUFF REPAIR, OPEN BICEP TENDODESIS AND OPEN DISTAL CLAVICLE RESECTION        Prior [place] expects to be discharged to:: Private residence Living Arrangements: Spouse/significant other;Children Available Help at Discharge: Family;Available 24 hours/day Type of Home: House Home Layout: One level Home Equipment: Shower seat Prior Function Level of Independence: Independent Communication Communication: No difficulties Dominant Hand: Right         Vision/Perception Vision - History Patient Visual Report: No change from baseline   Cognition  Cognition Arousal/Alertness: Awake/alert Behavior During Therapy: WFL for tasks assessed/performed Overall Cognitive Status: Within Functional Limits for tasks assessed    Extremity/Trunk Assessment Upper Extremity Assessment Upper Extremity Assessment: RUE deficits/detail RUE Deficits / Details: here for shoulder surgery; PROM elbow distally WNL (cannot yet do AROM due to nerve block has not worn off) RUE Coordination: decreased fine motor;decreased gross motor (due to nerve block not worn off and shoulder surgery)        Exercise Other Exercises Other Exercises: Supine, 10 reps each: went over AAROM shoulder flexion/extension (only to 45 degrees at this time since nerve block not worn off); external rotation with wand only to neutral (since nerve block has not worn off yet) and full elbow extension/flexion. Pt and nursing aware pt is to do these 2 more times today/night with nursing A'ing prn. 10 reps each      End of Session OT - End of Session Activity Tolerance: Patient tolerated treatment well Patient left: in bed;with call bell/phone within reach;with  family/visitor present  GO Functional Assessment Tool Used: Clincal observation Functional Limitation: Self care Self Care Current Status [id number]): At least 40 percent but less than 60 percent impaired, limited or restricted Self Care Goal Status [id number]): At least 20 percent but less than 40 percent impaired, limited or restricted   Almon Register [id number] 01/31/2013, 5:37 PM

## 2013-01-31 NOTE — Progress Notes (Signed)
Orthopedic Tech Progress Note Patient Details:  Whitney [name] 1945-11-09 [id number] Shoulder abd. Sling delivered to OR desk Ortho Devices Type of Ortho Device: Shoulder abduction pillow Ortho Device/Splint Interventions: Ordered   [place] R Thompson 01/31/2013, 11:14 AM

## 2013-01-31 NOTE — Anesthesia Preprocedure Evaluation (Addendum)
Anesthesia Evaluation  Patient identified by MRN, date of birth, ID band Patient awake    Reviewed: Allergy & Precautions, H&P , NPO status , Patient's Chart, lab work & pertinent test results  History of Anesthesia Complications (+) PONV and history of anesthetic complications  Airway Mallampati: II TM Distance: >3 FB Neck ROM: Full    Dental  (+) Teeth Intact and Dental Advisory Given   Pulmonary asthma (albuterol MDI. last use last week) ,  breath sounds clear to auscultation  Pulmonary exam normal       Cardiovascular hypertension, Pt. on medications Rhythm:Regular Rate:Normal  '12 stress test: normal perfusion, no ischemia, EF 65%   Neuro/Psych PSYCHIATRIC DISORDERS (PTSD) Anxiety Depression TIA   GI/Hepatic Neg liver ROS, GERD-  Controlled and Medicated,  Endo/Other  diabetes (glu 198glu 189), Insulin DependentMorbid obesity  Renal/GU negative Renal ROS     Musculoskeletal   Abdominal (+) + obese,   Peds  Hematology negative hematology ROS (+)   Anesthesia Other Findings   Reproductive/Obstetrics                          Anesthesia Physical Anesthesia Plan  ASA: III  Anesthesia Plan: General   Post-op Pain Management:    Induction: Intravenous  Airway Management Planned: Oral ETT  Additional Equipment:   Intra-op Plan:   Post-operative Plan: Extubation in OR  Informed Consent: I have reviewed the patients History and Physical, chart, labs and discussed the procedure including the risks, benefits and alternatives for the proposed anesthesia with the patient or authorized representative who has indicated his/her understanding and acceptance.   Dental advisory given  Plan Discussed with: CRNA and Surgeon  Anesthesia Plan Comments: (Plan routine monitors, GETA with interscalene block for post op analgesi)        Anesthesia Quick Evaluation

## 2013-01-31 NOTE — Progress Notes (Signed)
Occupational Therapy Treatment Patient Details Name: Whitney [name] MRN: [id number] DOB: March 18, 1945 Today's Date: 01/31/2013 Time: [id number] OT Time Calculation (min): 15 min  OT Assessment / Plan / Recommendation  History of present illness RIGHT SHOULDER ARTHROSCOPY WITH SUBACROMIAL DECOMPRESSION AND MINI OPEN ROTATOR CUFF REPAIR, OPEN BICEP TENDODESIS AND OPEN DISTAL CLAVICLE RESECTION    OT comments  Pt making progress, OT will follow up with her and family in the AM.  Follow Up Recommendations   (per MD)       Equipment Recommendations  None recommended by OT       Frequency Min 3X/week   Progress towards OT Goals Progress towards OT goals: Progressing toward goals  Plan Discharge plan remains appropriate    Precautions / Restrictions Precautions Precautions: Shoulder Type of Shoulder Precautions: NO ABDUCTION except for abduction pillow or gravity assisted for UBB/D, Lap slides, ER/IR wand, assisted reclined forward elevation, Active/Assist Shoulder ROM, Within limits of pain, ADL education Reason; Sling education; Pendulum exercises Elbow; wrist, hand active ROM. May remove sling and hug a pillow while in bed, use sling while up ambulating Shoulder Interventions: Don joy ultra sling;Shoulder abduction pillow;Off for dressing/bathing/exercises Required Braces or Orthoses: Sling Restrictions Weight Bearing Restrictions: Yes RUE Weight Bearing: Non weight bearing   Pertinent Vitals/Pain No C/O pain (nerve block not worn off yet)    ADL  Toilet Transfer: Supervision/safety Toilet Transfer Method: Sit to Loss adjuster, chartered: Comfort height toilet Toileting - Clothing Manipulation and Hygiene: Supervision/safety Where Assessed - Best boy and Hygiene: Sit to stand from 3-in-1 or toilet Equipment Used:  (sling) Transfers/Ambulation Related to ADLs: S for all without AD ADL Comments: Applied abduction sling correctly after clarifying with Dr.  Veverly Fells that this was what he wanted the patient to have.    OT Diagnosis: Generalized weakness  OT Problem List: Decreased strength;Decreased range of motion;Impaired UE functional use OT Treatment Interventions: Self-care/ADL training;Therapeutic exercise;Balance training;Patient/family education   OT Goals(current goals can now be found in the care plan section) Acute Rehab OT Goals Patient Stated Goal: Home tomorrow OT Goal Formulation: With patient Time For Goal Achievement: 02/07/13 Potential to Achieve Goals: Good ADL Goals Pt Will Perform Upper Body Bathing: with caregiver independent in assisting;sitting;standing;with min assist Pt Will Perform Lower Body Bathing: with caregiver independent in assisting;sit to/from stand;with mod assist Pt Will Perform Upper Body Dressing: with min assist;with caregiver independent in assisting;sitting;standing Pt Will Perform Lower Body Dressing: with mod assist;with caregiver independent in assisting;sit to/from stand Pt/caregiver will Perform Home Exercise Program: Increased ROM;Right Upper extremity;With written HEP provided Additional ADL Goal #1: Family will be independent in donning/doffing abduction sling  Visit Information  Last OT Received On: 01/31/13 Assistance Needed: +1 History of Present Illness: RIGHT SHOULDER ARTHROSCOPY WITH SUBACROMIAL DECOMPRESSION AND MINI OPEN ROTATOR CUFF REPAIR, OPEN BICEP TENDODESIS AND OPEN DISTAL CLAVICLE RESECTION     Subjective Data      Prior Functioning  Home Living Family/patient expects to be discharged to:: Private residence Living Arrangements: Spouse/significant other;Children Available Help at Discharge: Family;Available 24 hours/day Type of Home: House Home Layout: One level Home Equipment: Shower seat Prior Function Level of Independence: Independent Communication Communication: No difficulties Dominant Hand: Right    Cognition  Cognition Arousal/Alertness:  Awake/alert Behavior During Therapy: WFL for tasks assessed/performed Overall Cognitive Status: Within Functional Limits for tasks assessed    Mobility  Bed Mobility Overal bed mobility: Needs Assistance Bed Mobility: Supine to Sit;Sit to Supine Supine to sit: Supervision;HOB elevated Sit  to supine: Supervision;HOB elevated Transfers Overall transfer level: Needs assistance Equipment used: 1 person hand held assist Transfers: Sit to/from Stand Sit to Stand: Supervision General transfer comment: S for ambulation without AD          End of Session OT - End of Session Equipment Utilized During Treatment:  (Abduction sling) Activity Tolerance: Patient tolerated treatment well Patient left: in bed;with call bell/phone within reach Nurse Communication:  (Pt needs help to do her exercises 2 more times today/night 10 reps each)  GO Functional Assessment Tool Used: Clincal observation Functional Limitation: Self care Self Care Current Status [id number]): At least 40 percent but less than 60 percent impaired, limited or restricted Self Care Goal Status [id number]): At least 20 percent but less than 40 percent impaired, limited or restricted   Almon Register [id number] 01/31/2013, 6:24 PM

## 2013-01-31 NOTE — Brief Op Note (Signed)
01/31/2013  11:39 AM  PATIENT:  Whitney [name]  68 y.o. female  PRE-OPERATIVE DIAGNOSIS:  RIGHT SHOULDER ROTATOR CUFF AND SLAP TEAR,OSTEOARTHRITIS AC JOINT  POST-OPERATIVE DIAGNOSIS:  RIGHT SHOULDER ROTATOR CUFF AND SLAP TEAR,OSTEOARTHRITIS AC JOINT  PROCEDURE:  Procedure(s): RIGHT SHOULDER ARTHROSCOPY WITH SUBACROMIAL DECOMPRESSION AND MINI OPEN ROTATOR CUFF REPAIR, OPEN BICEP TENDODESIS AND OPEN DISTAL CLAVICLE RESECTION (Right)  SURGEON:  Surgeon(s) and Role:    * Augustin Schooling, MD - Primary  PHYSICIAN ASSISTANT:   ASSISTANTS: Ventura Bruns, PA-C   ANESTHESIA:   regional and general  EBL:  Total I/O In: 1000 [I.V.:1000] Out: -   BLOOD ADMINISTERED:none  DRAINS: none   LOCAL MEDICATIONS USED:  MARCAINE     SPECIMEN:  No Specimen  DISPOSITION OF SPECIMEN:  N/A  COUNTS:  YES  TOURNIQUET:  * No tourniquets in log *  DICTATION: .Other Dictation: Dictation Number [id number]  PLAN OF CARE: Admit for overnight observation  PATIENT DISPOSITION:  PACU - hemodynamically stable.   Delay start of Pharmacological VTE agent (>24hrs) due to surgical blood loss or risk of bleeding: no

## 2013-01-31 NOTE — Interval H&P Note (Signed)
History and Physical Interval Note:  01/31/2013 9:31 AM  Whitney [name]  has presented today for surgery, with the diagnosis of RIGHT SHOULDER ROTATOR CUFF AND SLAP TEAR,OSTEOARTHRITIS  The various methods of treatment have been discussed with the patient and family. After consideration of risks, benefits and other options for treatment, the patient has consented to  Procedure(s): RIGHT SHOULDER ARTHROSCOPY WITH SUBACROMIAL DECOMPRESSION AND MINI OPEN ROTATOR CUFF REPAIR, OPEN BICEP TENDODESIS AND OPEN DISTAL CLAVICLE RESECTION (Right) as a surgical intervention .  The patient's history has been reviewed, patient examined, no change in status, stable for surgery.  I have reviewed the patient's chart and labs.  Questions were answered to the patient's satisfaction.     Vere Diantonio,STEVEN R

## 2013-01-31 NOTE — Preoperative (Signed)
Beta Blockers   Reason not to administer Beta Blockers:Not Applicable 

## 2013-01-31 NOTE — Anesthesia Postprocedure Evaluation (Signed)
  Anesthesia Post-op Note  Patient: Whitney [name]  Procedure(s) Performed: Procedure(s): RIGHT SHOULDER ARTHROSCOPY WITH SUBACROMIAL DECOMPRESSION AND MINI OPEN ROTATOR CUFF REPAIR, OPEN BICEP TENDODESIS AND OPEN DISTAL CLAVICLE RESECTION (Right)  Patient Location: PACU  Anesthesia Type:GA combined with regional for post-op pain  Level of Consciousness: awake, alert , oriented and patient cooperative  Airway and Oxygen Therapy: Patient Spontanous Breathing and Patient connected to nasal cannula oxygen  Post-op Pain: none  Post-op Assessment: Post-op Vital signs reviewed, Patient's Cardiovascular Status Stable, Respiratory Function Stable, Patent Airway, No signs of Nausea or vomiting and Pain level controlled  Post-op Vital Signs: Reviewed and stable  Complications: No apparent anesthesia complications

## 2013-02-01 LAB — BASIC METABOLIC PANEL
BUN: 20 mg/dL (ref 6–23)
CO2: 22 meq/L (ref 19–32)
Calcium: 9 mg/dL (ref 8.4–10.5)
Chloride: 99 mEq/L (ref 96–112)
Creatinine, Ser: 1.27 mg/dL — ABNORMAL HIGH (ref 0.50–1.10)
GFR calc Af Amer: 49 mL/min — ABNORMAL LOW (ref >90)
GFR calc non Af Amer: 42 mL/min — ABNORMAL LOW (ref >90)
GLUCOSE: 171 mg/dL — AB (ref 70–99)
Potassium: 4.7 mEq/L (ref 3.7–5.3)
SODIUM: 138 meq/L (ref 137–147)

## 2013-02-01 LAB — GLUCOSE, CAPILLARY
GLUCOSE-CAPILLARY: 217 mg/dL — AB (ref 70–99)
GLUCOSE-CAPILLARY: 167 mg/dL — AB (ref 70–99)
Glucose-Capillary: 294 mg/dL — ABNORMAL HIGH (ref 70–99)
Glucose-Capillary: 255 mg/dL — ABNORMAL HIGH (ref 70–99)
Glucose-Capillary: 195 mg/dL — ABNORMAL HIGH (ref 70–99)

## 2013-02-01 LAB — HEMOGLOBIN AND HEMATOCRIT, BLOOD
HCT: 39.8 % (ref 36.0–46.0)
Hemoglobin: 13 g/dL (ref 12.0–15.0)

## 2013-02-01 NOTE — Op Note (Signed)
NAMEKALYNN, CALDEIRA               ACCOUNT NO.:  [id number]  MEDICAL RECORD NO.:  [id number]  LOCATION:  5N12C                        FACILITY:  [facility]  PHYSICIAN:  Doran Heater. Veverly Fells, M.D. DATE OF BIRTH:  1945-03-23  DATE OF PROCEDURE:  01/31/2013 DATE OF DISCHARGE:                              OPERATIVE REPORT   PREOPERATIVE DIAGNOSES:  Right shoulder rotator cuff tear, superior labrum anterior and posterior lesion, acromioclavicular joint arthritis.  POSTOPERATIVE DIAGNOSES:  Right shoulder rotator cuff tear, superior labrum anterior and posterior lesion, acromioclavicular joint arthritis.  PROCEDURE PERFORMED:  Right shoulder arthroscopy with extensive intra- articular debridement including debridement of torn superior labrum anterior-posterior, arthroscopic biceps tenotomy, arthroscopic chondroplasty __________ bleeding bone, debridement of anterior labral tear, arthroscopic subacromial decompression followed by mini open rotator cuff repair, biceps tenodesis in the groove, and open distal clavicle resection.  ATTENDING SURGEON:  Doran Heater. Veverly Fells, MD  ASSISTANT:  Abbott Pao. Dixon, PA-C, who scrubbed the entire procedure and necessary for satisfactory completion of surgery.  ANESTHESIA:  General anesthesia was used plus interscalene block.  ESTIMATED BLOOD LOSS:  Minimal.  FLUID REPLACEMENT:  1200 mL crystalloid.  INSTRUMENT COUNTS:  Correct.  COMPLICATIONS:  There were no complications.  ANTIBIOTICS:  Perioperative antibiotics were given.  INDICATIONS:  The patient is a 68 year old female with worsening right shoulder pain secondary to MRI documented rotator cuff tear SLAP lesion and advanced AC arthritis without limb impingement.  The patient has failed conservative management.  Desires operative treatment to restore function, eliminate pain to her shoulder.  Informed consent obtained.  DESCRIPTION OF PROCEDURE:  After adequate level of anesthesia achieved, the  patient was positioned in the modified beach-chair position.  Right shoulder correctly identified, and a time-out was called.  The patient's shoulder was examined under anesthesia with full passive range of motion with no significant stiffness or instability.  After sterile prep and drape of the shoulder had been completed and a time-out had been called, we entered the shoulder using standard portals including anterior, posterior, and lateral portals.  We identified the significant tearing of the biceps and superior labral junction.  We performed a biceps tenotomy and labral debridement back to stable labral rim.  Basket forceps and motorized shaver, anterior-inferior labrum was also torn and performed a debridement of that back to stable labral tissue.  Minimal chondroplasty in inferior portion of the glenoid and posterior-inferior portion of glenoid.  Humeral head looked to be in good shape. Subscapularis was intact.  Rotator cuff was torn.  This appeared to be __________ primarily supraspinatus, infraspinatus, and teres minor were visualized.  The anterior portal appeared normal.  Posterior labrum appeared to be intact, especially __________ and superiorly.  We then placed the scope in subacromial space thorough bursectomy. Acromioplasty was performed creating a type 1 acromial shape with a butcher block technique utilizing a high-speed bur.  We were careful not to take too much more acromion as the patient has had a prior open acromioplasty performed.  Once we were sure that the rotator cuff outlet was decompressed, we concluded the arthroscopic portion of procedure. We then made a small saber incision overlying the AC joint.  Dissection down through subcutaneous tissues  using a needle-tip Bovie, identified the deltotrapezial fascia, divided in line with distal clavicle. Subperiosteal dissection of distal clavicle performed followed by excision of distal 2 mm of bone using an  oscillating saw.  This decompressed the joint nicely.  We applied bone wax to the cut in the clavicle.  We thoroughly irrigated to remove excess wax.  We then removed the dorsal capsule which was hypertrophied on the acromion and some small acromial spurs at the [facility] joint margin.  We then repaired deltotrapezial fascia anatomically with 0 Vicryl suture followed by 2-0 Vicryl subcutaneous closure and 4-0 Monocryl for skin.  We addressed the rotator cuff and biceps through a single mini open incision starting at the anterolateral border of the acromion extending distally about 3-4 cm.  Dissection down through subcutaneous tissues using a needle-tip Bovie.  We identified the deltoid raphe between the anterior and lateral heads of the deltoid.  We developed that using a needle tip Bovie, placed Arthrex retractor.  We identified the biceps tendon, delivered that out of the soft tissue and out of the biceps groove, we prepared the floor of the biceps groove with a Associate Professor.  We placed a single Y-knot Flex anchor through the floor of the biceps groove, brought that up through the damaged tendon in a whipstitch fashion, tying the tendon down flush.  We had nice low-profile repair, trimmed away the remaining excess tendon and then oversewed the soft tissue over the top of the tendon and groove using 0 Vicryl suture.  We then addressed the rotator cuff tear which is an attritional type tear in the supraspinatus tendon.  We ellipsed out the damaged tissue leaving a basically a longitudinal split tear in the supraspinatus may be a little bit __________.  We then mobilized the posterior aspect of the tendon which seemed to mobilize nicely the anterior aspect, prepared the footprint with rongeur and curette.  Placed an 1 RC anchor at the articular margin, double loaded with #2 Hi-Fi suture.  We then used that in addition to Hi-Fi suture side-to-side medially and laterally to the repair.   We also took a suture down through the posterior robust portion of the rotator cuff tendon and took that down through the lateral humerus through a drill hole.  This gave [place] outstanding both medial footprint compression and lateral footprint compression via an anatomic repair not under tension.  We ranged the shoulder.  No impingement noted.  Thoroughly irrigated the subdeltoid interval and then at that point, repaired deltoid anatomically with 0 Vicryl suture followed by 2- 0 Vicryl subcutaneous closure and 4-0 Monocryl for skin and portals. Steri-Strips applied followed by sterile dressing.  The patient tolerated the surgery well.     Doran Heater. Veverly Fells, M.D.     SRN/MEDQ  D:  01/31/2013  T:  02/01/2013  Job:  [id number]

## 2013-02-01 NOTE — Progress Notes (Signed)
Occupational Therapy Treatment Patient Details Name: Kharmen Buschmann MRN: [id number] DOB: March 04, 1945 Today's Date: 02/01/2013 Time: [id number] OT Time Calculation (min): 50 min  OT Assessment / Plan / Recommendation  History of present illness RIGHT SHOULDER ARTHROSCOPY WITH SUBACROMIAL DECOMPRESSION AND MINI OPEN ROTATOR CUFF REPAIR, OPEN BICEP TENDODESIS AND OPEN DISTAL CLAVICLE RESECTION    OT comments  Pt. Awake and agreeable to all  RU exercises.  Required demonstrational and inst. Cues for completion stating that she "just cant remember them".  Tolerated well but will benefit from con't. Review tomorrow to ensure safe tech.                Equipment Recommendations  None recommended by OT        Frequency Min 3X/week   Progress towards OT Goals Progress towards OT goals: Progressing toward goals  Plan Discharge plan remains appropriate    Precautions / Restrictions Precautions Precautions: Shoulder Type of Shoulder Precautions: NO ABDUCTION except for abduction pillow or gravity assisted for UBB/D, Lap slides, ER/IR wand, assisted reclined forward elevation, Active/Assist Shoulder ROM, Within limits of pain, ADL education Reason; Sling education; Pendulum exercises Elbow; wrist, hand active ROM. May remove sling and hug a pillow while in bed, use sling while up ambulating Shoulder Interventions: Don joy ultra sling;Shoulder abduction pillow;Off for dressing/bathing/exercises Required Braces or Orthoses: Sling Restrictions RUE Weight Bearing: Non weight bearing   Pertinent Vitals/Pain 5/10    ADL        OT Goals(current goals can now be found in the care plan section)    Visit Information  Last OT Received On: 02/01/13 History of Present Illness: RIGHT SHOULDER ARTHROSCOPY WITH SUBACROMIAL DECOMPRESSION AND MINI OPEN ROTATOR CUFF REPAIR, OPEN BICEP TENDODESIS AND OPEN DISTAL CLAVICLE RESECTION                                Exercises  Shoulder  Exercises Pendulum Exercise: Right;5 reps;Standing Shoulder Flexion: Both;Supine;Other (comment) (LUE GUIDING RUE TO CHIN ONLY) Elbow Flexion: Right;5 reps;Supine;AAROM Elbow Extension: Right;5 reps;Supine;AAROM Wrist Flexion: AAROM;Right;5 reps;Supine Wrist Extension: AAROM;Right;5 reps;Supine Digit Composite Flexion: AAROM;Right;5 reps;Supine Composite Extension: AAROM;Right;5 reps;Supine Other Exercises Other Exercises: wand exercises, hob elevated, 5 reps, cues for using left hand to push LH shoe horn to create movement of right hand for correct motion of exercise Correct positioning of sling/immobilizer: Maximal assistance Pendulum exercises (written home exercise program): Moderate assistance ROM for elbow, wrist and digits of operated UE: Moderate assistance Sling wearing schedule (on at all times/off for ADL's): Moderate assistance       End of Session OT - End of Session Activity Tolerance: Patient tolerated treatment well Patient left: in bed;with call bell/phone within reach;with family/visitor present       Janice Coffin, COTA/L 02/01/2013, 1:31 PM

## 2013-02-01 NOTE — Progress Notes (Signed)
    Subjective: 1 Day Post-Op Procedure(s) (LRB): RIGHT SHOULDER ARTHROSCOPY WITH SUBACROMIAL DECOMPRESSION AND MINI OPEN ROTATOR CUFF REPAIR, OPEN BICEP TENDODESIS AND OPEN DISTAL CLAVICLE RESECTION (Right) Patient reports pain as 2 on 0-10 scale.   Denies CP or SOB.  Voiding without difficulty. Positive flatus. Objective: Vital signs in last 24 hours: Temp:  [97.8 F (36.6 C)-100.7 F (38.2 C)] 99.4 F (37.4 C) (01/10 0500) Pulse Rate:  [73-128] 112 (01/10 0500) Resp:  [13-26] 20 (01/10 0500) BP: (142-182)/(49-76) 142/52 mmHg (01/10 0500) SpO2:  [92 %-99 %] 95 % (01/10 0500) Weight:  [100.245 kg (221 lb)-100.5 kg (221 lb 9 oz)] 100.245 kg (221 lb) (01/09 1644)  Intake/Output from previous day: 01/09 0701 - 01/10 0700 In: 2260 [P.O.:460; I.V.:1800] Out: -  Intake/Output this shift:    Labs:  Recent Labs  01/31/13 0738 02/01/13 0500  HGB 14.0 13.0    Recent Labs  01/31/13 0738 02/01/13 0500  WBC 5.8  --   RBC 4.63  --   HCT 41.2 39.8  PLT 151  --     Recent Labs  01/31/13 0738  NA 140  K 4.4  CL 100  CO2 24  BUN 23  CREATININE 0.96  GLUCOSE 198*  CALCIUM 9.8   No results found for this basename: LABPT, INR,  in the last 72 hours  Physical Exam: Neurologically intact ABD soft Intact pulses distally Incision: dressing C/D/I and scant drainage  Assessment/Plan: 1 Day Post-Op Procedure(s) (LRB): RIGHT SHOULDER ARTHROSCOPY WITH SUBACROMIAL DECOMPRESSION AND MINI OPEN ROTATOR CUFF REPAIR, OPEN BICEP TENDODESIS AND OPEN DISTAL CLAVICLE RESECTION (Right) Advance diet Up with therapy Dressing changed Mobilization today Possible D/C Sunday  Alois Colgan D for Dr. Melina Schools [facility] [phone] 02/01/2013, 8:04 AM

## 2013-02-02 LAB — GLUCOSE, CAPILLARY
GLUCOSE-CAPILLARY: 208 mg/dL — AB (ref 70–99)
GLUCOSE-CAPILLARY: 290 mg/dL — AB (ref 70–99)

## 2013-02-02 MED ORDER — CEPHALEXIN 500 MG PO CAPS: 500.0000 mg | ORAL_CAPSULE | Freq: Three times a day (TID) | ORAL | Status: DC

## 2013-02-02 NOTE — Progress Notes (Signed)
Occupational Therapy Treatment Patient Details Name: Whitney [name] MRN: [id number] DOB: July 25, 1945 Today's Date: 02/02/2013 Time: [id number] OT Time Calculation (min): 18 min  OT Assessment / Plan / Recommendation  History of present illness RIGHT SHOULDER ARTHROSCOPY WITH SUBACROMIAL DECOMPRESSION AND MINI OPEN ROTATOR CUFF REPAIR, OPEN BICEP TENDODESIS AND OPEN DISTAL CLAVICLE RESECTION    OT comments  Patient has discharge orders for home. Will need assistance from husband and son. Patient lethargic during treatment.  Follow Up Recommendations    Physician to order outpatient therapy as indicated; 24/7 Supervision/Assistance by family   Progress towards OT Goals Progress towards OT goals: Progressing toward goals  Plan      Precautions / Restrictions Precautions Precautions: Shoulder Type of Shoulder Precautions: NO ABDUCTION except for abduction pillow or gravity assisted for UBB/D, Lap slides, ER/IR wand, assisted reclined forward elevation, Active/Assist Shoulder ROM, Within limits of pain, ADL education Reason; Sling education; Pendulum exercises Elbow; wrist, hand active ROM. May remove sling and hug a pillow while in bed, use sling while up ambulating Shoulder Interventions: Don joy ultra sling;Shoulder abduction pillow;Off for dressing/bathing/exercises Required Braces or Orthoses: Sling Restrictions RUE Weight Bearing: Non weight bearing   Pertinent Vitals/Pain C/o muscle spasms shoulder, nurse just gave her pain medications    ADL  ADL Comments: Reviewed shoulder protocol handout with patient with respect to sling, positioning, ADLs. Demonstrated exercises but pt declined practicing. Patient falling asleep during tx. She states her husband and son will help her at home and that she has a PT appointment on Monday.     OT Diagnosis:    OT Problem List:   OT Treatment Interventions:     OT Goals(current goals can now be found in the care plan section)    Visit  Information  Last OT Received On: 02/02/13 History of Present Illness: RIGHT SHOULDER ARTHROSCOPY WITH SUBACROMIAL DECOMPRESSION AND MINI OPEN ROTATOR CUFF REPAIR, OPEN BICEP TENDODESIS AND OPEN DISTAL CLAVICLE RESECTION     Subjective Data      Prior Functioning       Cognition  Cognition Arousal/Alertness: Lethargic;Suspect due to medications Behavior During Therapy: [facility] for tasks assessed/performed Overall Cognitive Status: Within Functional Limits for tasks assessed    End of Session OT - End of Session Activity Tolerance: Patient limited by lethargy Patient left: in bed;with call bell/phone within reach  GO     Jakwan Sally A 02/02/2013, 9:46 AM

## 2013-02-02 NOTE — Progress Notes (Signed)
Orthopedics Progress Note  Subjective: I feel better today  Objective:  Filed Vitals:   02/02/13 0700  BP: 133/48  Pulse: 98  Temp: 98.9 F (37.2 C)  Resp: 18    General: Awake and alert  Musculoskeletal: right shoulder wound very macerated due to moisture that accumulated under the mepilex type dressing.  No overt infection.  Wound blotted dry and re bandaged. Neurovascularly intact  Lab Results  Component Value Date   WBC 5.8 01/31/2013   HGB 13.0 02/01/2013   HCT 39.8 02/01/2013   MCV 89.0 01/31/2013   PLT 151 01/31/2013       Component Value Date/Time   NA 138 02/01/2013 0500   K 4.7 02/01/2013 0500   CL 99 02/01/2013 0500   CO2 22 02/01/2013 0500   GLUCOSE 171* 02/01/2013 0500   BUN 20 02/01/2013 0500   CREATININE 1.27* 02/01/2013 0500   CALCIUM 9.0 02/01/2013 0500   GFRNONAA 42* 02/01/2013 0500   GFRAA 49* 02/01/2013 0500    No results found for this basename: INR, PROTIME    Assessment/Plan: POD #2 s/p Procedure(s): RIGHT SHOULDER ARTHROSCOPY WITH SUBACROMIAL DECOMPRESSION AND MINI OPEN ROTATOR CUFF REPAIR, OPEN BICEP TENDODESIS AND OPEN DISTAL CLAVICLE RESECTION D/C home.  Will send home on po abx  And follow up on Tuesday for wound check.  Doran Heater. Veverly Fells, MD 02/02/2013 8:31 AM

## 2013-02-02 NOTE — Discharge Summary (Signed)
Physician Discharge Summary   Patient ID: Whitney [name] MRN: [id number] DOB/AGE: 07-20-1945 68 y.o.  Admit date: 01/31/2013 Discharge date: 02/02/2013  Admission Diagnoses:  Active Problems:   Rotator cuff (capsule) sprain   Discharge Diagnoses:  Same   Surgeries: Procedure(s): RIGHT SHOULDER ARTHROSCOPY WITH SUBACROMIAL DECOMPRESSION AND MINI OPEN ROTATOR CUFF REPAIR, OPEN BICEP TENDODESIS AND OPEN DISTAL CLAVICLE RESECTION on 01/31/2013   Consultants: OT  Discharged Condition: Stable  Hospital Course: Whitney [name] is an 68 y.o. female who was admitted 01/31/2013 with a chief complaint of right shoulder pain, and found to have a diagnosis of right shoulder rotator cuff tear.  They were brought to the operating room on 01/31/2013 and underwent the above named procedures.    The patient had an uncomplicated hospital course and was stable for discharge.  Recent vital signs:  Filed Vitals:   02/02/13 0700  BP: 133/48  Pulse: 98  Temp: 98.9 F (37.2 C)  Resp: 18    Recent laboratory studies:  Results for orders placed during the hospital encounter of 31/49/70  BASIC METABOLIC PANEL      Result Value Range   Sodium 140  137 - 147 mEq/L   Potassium 4.4  3.7 - 5.3 mEq/L   Chloride 100  96 - 112 mEq/L   CO2 24  19 - 32 mEq/L   Glucose, Bld 198 (*) 70 - 99 mg/dL   BUN 23  6 - 23 mg/dL   Creatinine, Ser 0.96  0.50 - 1.10 mg/dL   Calcium 9.8  8.4 - 10.5 mg/dL   GFR calc non Af Amer 59 (*) >90 mL/min   GFR calc Af Amer 69 (*) >90 mL/min  CBC      Result Value Range   WBC 5.8  4.0 - 10.5 K/uL   RBC 4.63  3.87 - 5.11 MIL/uL   Hemoglobin 14.0  12.0 - 15.0 g/dL   HCT 41.2  36.0 - 46.0 %   MCV 89.0  78.0 - 100.0 fL   MCH 30.2  26.0 - 34.0 pg   MCHC 34.0  30.0 - 36.0 g/dL   RDW 13.2  11.5 - 15.5 %   Platelets 151  150 - 400 K/uL  GLUCOSE, CAPILLARY      Result Value Range   Glucose-Capillary 189 (*) 70 - 99 mg/dL  GLUCOSE, CAPILLARY      Result Value Range   Glucose-Capillary 190 (*) 70 - 99 mg/dL   Comment 1 Documented in Chart     Comment 2 Notify RN    GLUCOSE, CAPILLARY      Result Value Range   Glucose-Capillary 287 (*) 70 - 99 mg/dL   Comment 1 Documented in Chart     Comment 2 Notify RN    HEMOGLOBIN AND HEMATOCRIT, BLOOD      Result Value Range   Hemoglobin 13.0  12.0 - 15.0 g/dL   HCT 39.8  36.0 - 26.3 %  BASIC METABOLIC PANEL      Result Value Range   Sodium 138  137 - 147 mEq/L   Potassium 4.7  3.7 - 5.3 mEq/L   Chloride 99  96 - 112 mEq/L   CO2 22  19 - 32 mEq/L   Glucose, Bld 171 (*) 70 - 99 mg/dL   BUN 20  6 - 23 mg/dL   Creatinine, Ser 1.27 (*) 0.50 - 1.10 mg/dL   Calcium 9.0  8.4 - 10.5 mg/dL   GFR calc non Af Amer 42 (*) >  90 mL/min   GFR calc Af Amer 49 (*) >90 mL/min  GLUCOSE, CAPILLARY      Result Value Range   Glucose-Capillary 164 (*) 70 - 99 mg/dL  GLUCOSE, CAPILLARY      Result Value Range   Glucose-Capillary 217 (*) 70 - 99 mg/dL  GLUCOSE, CAPILLARY      Result Value Range   Glucose-Capillary 167 (*) 70 - 99 mg/dL  GLUCOSE, CAPILLARY      Result Value Range   Glucose-Capillary 294 (*) 70 - 99 mg/dL  GLUCOSE, CAPILLARY      Result Value Range   Glucose-Capillary 255 (*) 70 - 99 mg/dL  GLUCOSE, CAPILLARY      Result Value Range   Glucose-Capillary 195 (*) 70 - 99 mg/dL   Comment 1 Documented in Chart     Comment 2 Notify RN    GLUCOSE, CAPILLARY      Result Value Range   Glucose-Capillary 208 (*) 70 - 99 mg/dL   Comment 1 Documented in Chart     Comment 2 Notify RN    NO BLOOD PRODUCTS      Result Value Range   Transfuse no blood products       Value: TRANSFUSE NO BLOOD PRODUCTS, VERIFIED BY Herbie Baltimore B. Todd, RN    Discharge Medications:     Medication List         albuterol 108 (90 BASE) MCG/ACT inhaler  Commonly known as:  PROVENTIL HFA;VENTOLIN HFA  Inhale into the lungs every 6 (six) hours as needed for wheezing or shortness of breath.     CALCIUM PO  Take 1 tablet by mouth daily.       celecoxib 200 MG capsule  Commonly known as:  CELEBREX  Take 200 mg by mouth daily.     citalopram 20 MG tablet  Commonly known as:  CELEXA  Take 20 mg by mouth daily.     diclofenac sodium 1 % Gel  Commonly known as:  VOLTAREN  Apply 2 g topically 4 (four) times daily as needed (for pain).     insulin aspart 100 UNIT/ML injection  Commonly known as:  novoLOG  Inject 20 Units into the skin daily.     insulin glargine 100 UNIT/ML injection  Commonly known as:  LANTUS  Inject 110 Units into the skin at bedtime.     KRILL OIL PLUS PO  Take 1 capsule by mouth daily.     lisinopril-hydrochlorothiazide 20-12.5 MG per tablet  Commonly known as:  PRINZIDE,ZESTORETIC  Take 1 tablet by mouth daily.     methocarbamol 500 MG tablet  Commonly known as:  ROBAXIN  Take 1 tablet (500 mg total) by mouth 3 (three) times daily as needed for muscle spasms.     omeprazole 40 MG capsule  Commonly known as:  PRILOSEC  Take 40 mg by mouth daily.     oxyCODONE-acetaminophen 5-325 MG per tablet  Commonly known as:  ROXICET  Take 1-2 tablets by mouth every 4 (four) hours as needed for severe pain.     pioglitazone-metformin 15-850 MG per tablet  Commonly known as:  ACTOPLUS MET  Take 1 tablet by mouth 2 (two) times daily with a meal.     rosuvastatin 20 MG tablet  Commonly known as:  CRESTOR  Take 20 mg by mouth daily.     VITAMIN D PO  Take 1 tablet by mouth daily.        Diagnostic Studies: No results found.  Disposition: Final discharge disposition not confirmed        Follow-up Information   Follow up with Anala Whisenant,STEVEN R, MD. Schedule an appointment as soon as possible for a visit in 2 days. [phone]  Tuesday please)    Specialty:  Orthopedic Surgery   Contact information:   [address] [place] [address] [phone]        Signed: Augustin Schooling 02/02/2013, 8:34 AM

## 2013-02-02 NOTE — Discharge Instructions (Signed)
Ice to the shoulder constantly, change dressing at least daily if not more to keep wound dry!!!  Do exercises every hour while awake.  Take pain medications scheduled for the first few days, then as needed next week.  Keep surgical dressing intact through the weekend, then ok to remove and replace with Band Aids  Keep incision clean and dry for one week from surgery then ok to get wet in the shower.  Follow up in two weeks with Dr Veverly Fells

## 2013-02-02 NOTE — Progress Notes (Signed)
Discharge instructions given to and reviewed with patient. Patient denies questions or concerns. IV removed. Patient discharged via wheelchair with discharge instructions, prescriptions, and personal belongings.

## 2013-02-03 ENCOUNTER — Encounter (HOSPITAL_COMMUNITY): Payer: Self-pay | Admitting: Orthopedic Surgery

## 2013-11-18 NOTE — H&P (Signed)
Whitney [name] is an 68 y.o. female.    Chief Complaint: left shoulder pain  HPI: Pt is a 68 y.o. female complaining of left shoulder pain for multiple years. Pain had continually increased since the beginning. X-rays in the clinic show rotator cuff tear left shoulder. Pt has tried various conservative treatments which have failed to alleviate their symptoms, including injections and therapy. Various options are discussed with the patient. Risks, benefits and expectations were discussed with the patient. Patient understand the risks, benefits and expectations and wishes to proceed with surgery.   PCP:  No primary provider on file.  D/C Plans:  Home   PMH: Past Medical History  Diagnosis Date  . Complication of anesthesia   . PONV (postoperative nausea and vomiting)     years ago  . GERD (gastroesophageal reflux disease)   . Diabetes mellitus without complication   . Hypertension   . Anxiety   . Depression   . PTSD (post-traumatic stress disorder)   . Concussion   . Stroke     "they think a silent one"  . IBS (irritable bowel syndrome)     diarrhea  . Cancer     skin cancer- face-   . MRSA (methicillin resistant Staphylococcus aureus)     after skin cancer removed from face.  . Neuropathy     PSH: Past Surgical History  Procedure Laterality Date  . Ovarian cyst surgery    . Dilation and curettage of uterus    . Abdominal hysterectomy    . Salpingoophorectomy Bilateral   . Trigger finger release Right     4   . Cholecystectomy    . Tubal ligation    . Carpal tunnel release Bilateral   . Trigger finger release Bilateral     thumbs  . Rotator cuff repair Bilateral   . Cataract extraction Bilateral   . Shoulder arthroscopy with subacromial decompression and open rotator c Right 01/31/2013    Procedure: RIGHT SHOULDER ARTHROSCOPY WITH SUBACROMIAL DECOMPRESSION AND MINI OPEN ROTATOR CUFF REPAIR, OPEN BICEP TENDODESIS AND OPEN DISTAL CLAVICLE RESECTION;  Surgeon: Augustin Schooling, MD;  Location: [facility];  Service: Orthopedics;  Laterality: Right;    Social History:  reports that she has never smoked. She does not have any smokeless tobacco history on file. She reports that she does not drink alcohol or use illicit drugs.  Allergies:  Allergies  Allergen Reactions  . Talwin [Pentazocine] Nausea Only and Other (See Comments)    Confusion  . Codeine Nausea And Vomiting and Other (See Comments)    disorientation  . Erythromycin Hives  . Morphine And Related Rash  . Percocet [Oxycodone-Acetaminophen] Itching    Medications: No current facility-administered medications for this encounter.   Current Outpatient Prescriptions  Medication Sig Dispense Refill  . albuterol (PROVENTIL HFA;VENTOLIN HFA) 108 (90 BASE) MCG/ACT inhaler Inhale 2 puffs into the lungs every 6 (six) hours as needed for wheezing or shortness of breath.       Marland Kitchen CALCIUM PO Take 1 tablet by mouth daily.      . celecoxib (CELEBREX) 200 MG capsule Take 200 mg by mouth daily.      . Cholecalciferol (VITAMIN D PO) Take 1 tablet by mouth daily.      . citalopram (CELEXA) 20 MG tablet Take 20 mg by mouth daily.      . diclofenac sodium (VOLTAREN) 1 % GEL Apply 2 g topically 4 (four) times daily as needed (for pain).      Marland Kitchen  Fish Oil-Krill Oil (KRILL OIL PLUS PO) Take 1 capsule by mouth daily.      . insulin aspart (NOVOLOG) 100 UNIT/ML injection Inject 20 Units into the skin daily.      . insulin glargine (LANTUS) 100 UNIT/ML injection Inject 110 Units into the skin at bedtime.      Marland Kitchen lisinopril-hydrochlorothiazide (PRINZIDE,ZESTORETIC) 20-12.5 MG per tablet Take 1 tablet by mouth daily.      Marland Kitchen LORazepam (ATIVAN) 0.5 MG tablet Take 0.5 mg by mouth 3 (three) times daily.      Marland Kitchen omeprazole (PRILOSEC) 40 MG capsule Take 40 mg by mouth daily.      . pioglitazone-metformin (ACTOPLUS MET) 15-850 MG per tablet Take 2 tablets by mouth daily.       . rosuvastatin (CRESTOR) 20 MG tablet Take 20 mg by mouth  daily.        No results found for this or any previous visit (from the past 48 hour(s)). No results found.  ROS: Pain with rom of the left upper extremity  Physical Exam:  Alert and oriented 68 y.o. female in no acute distress Cranial nerves 2-12 intact Cervical spine: full rom with no tenderness, nv intact distally Chest: active breath sounds bilaterally, no wheeze rhonchi or rales Heart: regular rate and rhythm, no murmur Abd: non tender non distended with active bowel sounds Hip is stable with rom  Left shoulder with moderate restriction with rom due to pain and guarding nv intact distally Strength 3/5 with ER and IR  No rashes or edema  Assessment/Plan Assessment: left shoulder pain secondary to rotator cuff tear  Plan: Patient will undergo a left shoulder scope with rotator cuff repair by Dr. Veverly Fells at [facility]. Risks benefits and expectations were discussed with the patient. Patient understand risks, benefits and expectations and wishes to proceed.

## 2013-11-19 ENCOUNTER — Encounter (HOSPITAL_COMMUNITY): Payer: Self-pay

## 2013-11-19 ENCOUNTER — Encounter (HOSPITAL_COMMUNITY)
Admission: RE | Admit: 2013-11-19 | Discharge: 2013-11-19 | Disposition: A | Discharge status: Home / Self Care | Payer: Medicare HMO | Source: Ambulatory Visit | Attending: Orthopedic Surgery | Admitting: Orthopedic Surgery

## 2013-11-19 DIAGNOSIS — Z8673 Personal history of transient ischemic attack (TIA), and cerebral infarction without residual deficits: Secondary | ICD-10-CM | POA: Insufficient documentation

## 2013-11-19 DIAGNOSIS — F4310 Post-traumatic stress disorder, unspecified: Secondary | ICD-10-CM | POA: Diagnosis not present

## 2013-11-19 DIAGNOSIS — Z01818 Encounter for other preprocedural examination: Secondary | ICD-10-CM | POA: Diagnosis present

## 2013-11-19 DIAGNOSIS — I10 Essential (primary) hypertension: Secondary | ICD-10-CM | POA: Insufficient documentation

## 2013-11-19 DIAGNOSIS — F419 Anxiety disorder, unspecified: Secondary | ICD-10-CM | POA: Insufficient documentation

## 2013-11-19 DIAGNOSIS — E119 Type 2 diabetes mellitus without complications: Secondary | ICD-10-CM | POA: Diagnosis not present

## 2013-11-19 LAB — SURGICAL PCR SCREEN
MRSA, PCR: NEGATIVE
Staphylococcus aureus: NEGATIVE

## 2013-11-19 LAB — CBC
HCT: 41.6 % (ref 36.0–46.0)
Hemoglobin: 14 g/dL (ref 12.0–15.0)
MCH: 29.2 pg (ref 26.0–34.0)
MCHC: 33.7 g/dL (ref 30.0–36.0)
MCV: 86.8 fL (ref 78.0–100.0)
PLATELETS: 158 10*3/uL (ref 150–400)
RBC: 4.79 MIL/uL (ref 3.87–5.11)
RDW: 13.1 % (ref 11.5–15.5)
WBC: 5 10*3/uL (ref 4.0–10.5)

## 2013-11-19 LAB — BASIC METABOLIC PANEL
Anion gap: 16 — ABNORMAL HIGH (ref 5–15)
BUN: 18 mg/dL (ref 6–23)
CALCIUM: 9.7 mg/dL (ref 8.4–10.5)
CO2: 23 mEq/L (ref 19–32)
Chloride: 99 mEq/L (ref 96–112)
Creatinine, Ser: 0.86 mg/dL (ref 0.50–1.10)
GFR calc Af Amer: 78 mL/min — ABNORMAL LOW (ref >90)
GFR, EST NON AFRICAN AMERICAN: 67 mL/min — AB (ref >90)
GLUCOSE: 252 mg/dL — AB (ref 70–99)
Potassium: 4.1 mEq/L (ref 3.7–5.3)
Sodium: 138 mEq/L (ref 137–147)

## 2013-11-19 LAB — NO BLOOD PRODUCTS

## 2013-11-19 NOTE — Pre-Procedure Instructions (Signed)
Whitney [name]  11/19/2013   Your procedure is scheduled on: Friday, November 28, 2013  Report to [facility] Admitting at 8:40 AM.  Call this number if you have problems the morning of surgery: [phone]   Remember:   Do not eat food or drink liquids after midnight Thursday, November 27, 2013   Take these medicines the morning of surgery with A SIP OF WATER: citalopram (CELEXA), LORazepam (ATIVAN),  omeprazole (PRILOSEC), if needed: albuterol (PROVENTIL HFA;VENTOLIN HFA) 108 (90 BASE) MCG/ACT inhaler for  wheezing or shortness of breath ( bring inhaler in on the day of procedure)  DO NOT TAKE ANY DIABETIC MEDICATIONS ON THE DAY OF SURGERY  Stop taking Aspirin, vitamins, and herbal medications (Fish Oil-Krill Oil (KRILL OIL)  Do not take any NSAIDs ie: Ibuprofen, Advil, Naproxen or any medication containing Aspirin ( celecoxib (CELEBREX), diclofenac sodium (VOLTAREN) 1 % GEL; stop 5 days prior to procedure Sunday, November 23, 2013    Do not wear jewelry, make-up or nail polish.  Do not wear lotions, powders, or perfumes. You may not wear deodorant.  Do not shave 48 hours prior to surgery.   Do not bring valuables to the hospital.  [facility] is not responsible for any belongings or valuables.               Contacts, dentures or bridgework may not be worn into surgery.  Leave suitcase in the car. After surgery it may be brought to your room.  For patients admitted to the hospital, discharge time is determined by your treatment team.               Patients discharged the day of surgery will not be allowed to drive home.  Name and phone number of your driver:   Special Instructions:  Special Instructions:Special Instructions: [facility] - Preparing for Surgery  Before surgery, you can play an important role.  Because skin is not sterile, your skin needs to be as free of germs as possible.  You can reduce the number of germs on you skin by washing with CHG (chlorahexidine  gluconate) soap before surgery.  CHG is an antiseptic cleaner which kills germs and bonds with the skin to continue killing germs even after washing.  Please DO NOT use if you have an allergy to CHG or antibacterial soaps.  If your skin becomes reddened/irritated stop using the CHG and inform your nurse when you arrive at Short Stay.  Do not shave (including legs and underarms) for at least 48 hours prior to the first CHG shower.  You may shave your face.  Please follow these instructions carefully:   1.  Shower with CHG Soap the night before surgery and the morning of Surgery.  2.  If you choose to wash your hair, wash your hair first as usual with your normal shampoo.  3.  After you shampoo, rinse your hair and body thoroughly to remove the Shampoo.  4.  Use CHG as you would any other liquid soap.  You can apply chg directly  to the skin and wash gently with scrungie or a clean washcloth.  5.  Apply the CHG Soap to your body ONLY FROM THE NECK DOWN.  Do not use on open wounds or open sores.  Avoid contact with your eyes, ears, mouth and genitals (private parts).  Wash genitals (private parts) with your normal soap.  6.  Wash thoroughly, paying special attention to the area where your surgery will be  performed.  7.  Thoroughly rinse your body with warm water from the neck down.  8.  DO NOT shower/wash with your normal soap after using and rinsing off the CHG Soap.  9.  Pat yourself dry with a clean towel.            10.  Wear clean pajamas.            11.  Place clean sheets on your bed the night of your first shower and do not sleep with pets.  Day of Surgery  Do not apply any lotions/deodorants the morning of surgery.  Please wear clean clothes to the hospital/surgery center.   Please read over the following fact sheets that you were given: Pain Booklet, Coughing and Deep Breathing and Surgical Site Infection Prevention

## 2013-11-19 NOTE — Progress Notes (Signed)
11/19/13 1538  OBSTRUCTIVE SLEEP APNEA  Have you ever been diagnosed with sleep apnea through a sleep study? No  Do you snore loudly (loud enough to be heard through closed doors)?  0  Do you often feel tired, fatigued, or sleepy during the daytime? 1  Has anyone observed you stop breathing during your sleep? 0  Do you have, or are you being treated for high blood pressure? 1  BMI more than 35 kg/m2? 1  Age over 69 years old? 1  Neck circumference greater than 40 cm/16 inches? 1  Gender: 0  Obstructive Sleep Apnea Score 5

## 2013-11-19 NOTE — Progress Notes (Signed)
Anesthesia consult note:   Called by Sherlynn Stalls, RN to see pt in PAT with recurrent chest pain since last 04/22/2013.    Pt is 68 year old female scheduled for L shoulder arthroscopy with subacromial decompression, bicep tenodesis, mini open rotator cuff repair, open distal clavicle resection on 11/28/2013 with Dr. Veverly Fells.   PMH: HTN, DM, stroke ("a silent one"), anxiety, PTSD  Medications include: lisinopril/hctz, insulin, pioglitazone/metformin, rosuvastatin, celexa, celebrex, ativan, diclofenac  Preoperative labs reviewed.  Blood glucose 252  EKG 01/31/2013: NSR, L axis deviation.   Pt's son died in 04/22/13, and since then pt has been having occasional chest pain on and off.  Her pcp is aware of the original episode. EKG was done, pcp and pt felt it was a grief reaction as pt had similar sx 8 years ago when daughter died.  No other testing was done. Pt reports she continues to have brief chest pain episodes maybe once a week, lasting for "3 or 4 twinges" over a few seconds. Not associated with nausea, dizziness, diaphoresis, or SOB. Pt reports episodes occur at rest when pt is experiencing grief emotions. Pt is not active due to arthritis but reports she does clean her own home without sx of chest pain or SOB.   Pt has medical clearance for surgery from PCP Dr. Kennith Maes at [place].   Discussed with Dr. Deatra Canter.   If no changes, I anticipate pt can proceed with surgery as scheduled.   Willeen Cass, FNP-BC [facility] Short Stay Surgical Center/Anesthesiology Phone: [phone] 11/20/2013 12:55 PM

## 2013-11-27 MED ORDER — CEFAZOLIN SODIUM-DEXTROSE 2-3 GM-% IV SOLR
2.0000 g | INTRAVENOUS | Status: AC
  Administered 2013-11-28: 2 g via INTRAVENOUS
  Filled 2013-11-27: qty 50

## 2013-11-28 ENCOUNTER — Ambulatory Visit (HOSPITAL_COMMUNITY): Payer: Medicare HMO | Admitting: Vascular Surgery

## 2013-11-28 ENCOUNTER — Encounter (HOSPITAL_COMMUNITY)
Admission: RE | Disposition: A | Discharge status: Home / Self Care | Payer: Self-pay | Source: Ambulatory Visit | Attending: Orthopedic Surgery

## 2013-11-28 ENCOUNTER — Observation Stay (HOSPITAL_COMMUNITY)
Admission: RE | Admit: 2013-11-28 | Discharge: 2013-11-29 | Disposition: A | Discharge status: Home / Self Care | Payer: Medicare HMO | Source: Ambulatory Visit | Attending: Orthopedic Surgery | Admitting: Orthopedic Surgery

## 2013-11-28 ENCOUNTER — Ambulatory Visit (HOSPITAL_COMMUNITY): Payer: Medicare HMO | Admitting: Certified Registered"

## 2013-11-28 ENCOUNTER — Encounter (HOSPITAL_COMMUNITY): Payer: Self-pay | Admitting: Orthopedic Surgery

## 2013-11-28 DIAGNOSIS — M19012 Primary osteoarthritis, left shoulder: Secondary | ICD-10-CM | POA: Diagnosis present

## 2013-11-28 DIAGNOSIS — Z794 Long term (current) use of insulin: Secondary | ICD-10-CM | POA: Insufficient documentation

## 2013-11-28 DIAGNOSIS — E119 Type 2 diabetes mellitus without complications: Secondary | ICD-10-CM | POA: Insufficient documentation

## 2013-11-28 DIAGNOSIS — F418 Other specified anxiety disorders: Secondary | ICD-10-CM | POA: Insufficient documentation

## 2013-11-28 DIAGNOSIS — X58XXXA Exposure to other specified factors, initial encounter: Secondary | ICD-10-CM | POA: Insufficient documentation

## 2013-11-28 DIAGNOSIS — Z79899 Other long term (current) drug therapy: Secondary | ICD-10-CM | POA: Diagnosis not present

## 2013-11-28 DIAGNOSIS — M75100 Unspecified rotator cuff tear or rupture of unspecified shoulder, not specified as traumatic: Secondary | ICD-10-CM | POA: Diagnosis present

## 2013-11-28 DIAGNOSIS — M7542 Impingement syndrome of left shoulder: Secondary | ICD-10-CM | POA: Insufficient documentation

## 2013-11-28 DIAGNOSIS — Z8673 Personal history of transient ischemic attack (TIA), and cerebral infarction without residual deficits: Secondary | ICD-10-CM | POA: Diagnosis not present

## 2013-11-28 DIAGNOSIS — I10 Essential (primary) hypertension: Secondary | ICD-10-CM | POA: Insufficient documentation

## 2013-11-28 DIAGNOSIS — K219 Gastro-esophageal reflux disease without esophagitis: Secondary | ICD-10-CM | POA: Diagnosis not present

## 2013-11-28 DIAGNOSIS — S43432A Superior glenoid labrum lesion of left shoulder, initial encounter: Secondary | ICD-10-CM | POA: Diagnosis not present

## 2013-11-28 DIAGNOSIS — Z791 Long term (current) use of non-steroidal anti-inflammatories (NSAID): Secondary | ICD-10-CM | POA: Insufficient documentation

## 2013-11-28 DIAGNOSIS — M75102 Unspecified rotator cuff tear or rupture of left shoulder, not specified as traumatic: Secondary | ICD-10-CM | POA: Insufficient documentation

## 2013-11-28 HISTORY — PX: SHOULDER ARTHROSCOPY WITH SUBACROMIAL DECOMPRESSION AND OPEN ROTATOR C: SHX5688

## 2013-11-28 LAB — GLUCOSE, CAPILLARY
GLUCOSE-CAPILLARY: 189 mg/dL — AB (ref 70–99)
GLUCOSE-CAPILLARY: 318 mg/dL — AB (ref 70–99)
Glucose-Capillary: 149 mg/dL — ABNORMAL HIGH (ref 70–99)
Glucose-Capillary: 210 mg/dL — ABNORMAL HIGH (ref 70–99)

## 2013-11-28 SURGERY — SHOULDER ARTHROSCOPY WITH SUBACROMIAL DECOMPRESSION AND OPEN ROTATOR CUFF REPAIR, OPEN BICEPS TENDON REPAIR
Anesthesia: Regional | Site: Shoulder | Laterality: Left

## 2013-11-28 MED ORDER — ONDANSETRON HCL 4 MG PO TABS
4.0000 mg | ORAL_TABLET | Freq: Four times a day (QID) | ORAL | Status: DC | PRN
  Filled 2013-11-28: qty 1

## 2013-11-28 MED ORDER — DEXAMETHASONE SODIUM PHOSPHATE 4 MG/ML IJ SOLN
INTRAMUSCULAR | Status: AC
  Filled 2013-11-28: qty 1

## 2013-11-28 MED ORDER — INSULIN ASPART 100 UNIT/ML ~~LOC~~ SOLN: 4.0000 [IU] | Freq: Three times a day (TID) | SUBCUTANEOUS | Status: DC

## 2013-11-28 MED ORDER — LIDOCAINE HCL (CARDIAC) 20 MG/ML IV SOLN
INTRAVENOUS | Status: AC
  Filled 2013-11-28: qty 5

## 2013-11-28 MED ORDER — CELECOXIB 200 MG PO CAPS
200.0000 mg | ORAL_CAPSULE | Freq: Every day | ORAL | Status: DC
  Administered 2013-11-29: 200 mg via ORAL
  Filled 2013-11-28: qty 1

## 2013-11-28 MED ORDER — LISINOPRIL 20 MG PO TABS
20.0000 mg | ORAL_TABLET | Freq: Every day | ORAL | Status: DC
  Administered 2013-11-29: 20 mg via ORAL
  Filled 2013-11-28: qty 1

## 2013-11-28 MED ORDER — FENTANYL CITRATE 0.05 MG/ML IJ SOLN: 25.0000 ug | INTRAMUSCULAR | Status: DC | PRN

## 2013-11-28 MED ORDER — HYDROMORPHONE HCL 1 MG/ML IJ SOLN: 0.5000 mg | INTRAMUSCULAR | Status: DC | PRN

## 2013-11-28 MED ORDER — PROPOFOL 10 MG/ML IV BOLUS
INTRAVENOUS | Status: AC
  Filled 2013-11-28: qty 20

## 2013-11-28 MED ORDER — ONDANSETRON HCL 4 MG/2ML IJ SOLN
4.0000 mg | Freq: Four times a day (QID) | INTRAMUSCULAR | Status: AC | PRN
  Administered 2013-11-28: 4 mg via INTRAVENOUS

## 2013-11-28 MED ORDER — LACTATED RINGERS IV SOLN
INTRAVENOUS | Status: DC
  Administered 2013-11-28 (×2): via INTRAVENOUS

## 2013-11-28 MED ORDER — FENTANYL CITRATE 0.05 MG/ML IJ SOLN
100.0000 ug | Freq: Once | INTRAMUSCULAR | Status: AC
  Administered 2013-11-28: 100 ug via INTRAVENOUS

## 2013-11-28 MED ORDER — ROCURONIUM BROMIDE 50 MG/5ML IV SOLN
INTRAVENOUS | Status: AC
  Filled 2013-11-28: qty 1

## 2013-11-28 MED ORDER — ROCURONIUM BROMIDE 100 MG/10ML IV SOLN
INTRAVENOUS | Status: DC | PRN
  Administered 2013-11-28: 50 mg via INTRAVENOUS

## 2013-11-28 MED ORDER — METHOCARBAMOL 500 MG PO TABS: 500.0000 mg | ORAL_TABLET | Freq: Three times a day (TID) | ORAL | Status: DC | PRN

## 2013-11-28 MED ORDER — FENTANYL CITRATE 0.05 MG/ML IJ SOLN
INTRAMUSCULAR | Status: AC
  Filled 2013-11-28: qty 5

## 2013-11-28 MED ORDER — ONDANSETRON HCL 4 MG/2ML IJ SOLN
INTRAMUSCULAR | Status: AC
  Filled 2013-11-28: qty 2

## 2013-11-28 MED ORDER — ALBUTEROL SULFATE (2.5 MG/3ML) 0.083% IN NEBU: 2.5000 mg | INHALATION_SOLUTION | Freq: Four times a day (QID) | RESPIRATORY_TRACT | Status: DC | PRN

## 2013-11-28 MED ORDER — GLYCOPYRROLATE 0.2 MG/ML IJ SOLN
INTRAMUSCULAR | Status: DC | PRN
  Administered 2013-11-28: .4 mg via INTRAVENOUS

## 2013-11-28 MED ORDER — METFORMIN HCL 850 MG PO TABS
1700.0000 mg | ORAL_TABLET | Freq: Every day | ORAL | Status: DC
  Administered 2013-11-29: 1700 mg via ORAL
  Filled 2013-11-28 (×2): qty 2

## 2013-11-28 MED ORDER — CALCIUM CARBONATE 1250 (500 CA) MG PO TABS
1.0000 | ORAL_TABLET | Freq: Every day | ORAL | Status: DC
Start: 2013-11-29 — End: 2013-11-29
  Administered 2013-11-29: 500 mg via ORAL
  Filled 2013-11-28 (×2): qty 1

## 2013-11-28 MED ORDER — FENTANYL CITRATE 0.05 MG/ML IJ SOLN
INTRAMUSCULAR | Status: AC
  Filled 2013-11-28: qty 2

## 2013-11-28 MED ORDER — ALBUTEROL SULFATE HFA 108 (90 BASE) MCG/ACT IN AERS: 2.0000 | INHALATION_SPRAY | Freq: Four times a day (QID) | RESPIRATORY_TRACT | Status: DC | PRN

## 2013-11-28 MED ORDER — MIDAZOLAM HCL 2 MG/2ML IJ SOLN
INTRAMUSCULAR | Status: AC
  Filled 2013-11-28: qty 2

## 2013-11-28 MED ORDER — DEXTROSE 5 % IV SOLN
10.0000 mg | INTRAVENOUS | Status: DC | PRN
  Administered 2013-11-28: 40 ug/min via INTRAVENOUS

## 2013-11-28 MED ORDER — ACETAMINOPHEN 325 MG PO TABS: 650.0000 mg | ORAL_TABLET | Freq: Four times a day (QID) | ORAL | Status: DC | PRN

## 2013-11-28 MED ORDER — ROSUVASTATIN CALCIUM 20 MG PO TABS
20.0000 mg | ORAL_TABLET | Freq: Every day | ORAL | Status: DC
  Administered 2013-11-29: 20 mg via ORAL
  Filled 2013-11-28: qty 1

## 2013-11-28 MED ORDER — PANTOPRAZOLE SODIUM 40 MG PO TBEC
80.0000 mg | DELAYED_RELEASE_TABLET | Freq: Every day | ORAL | Status: DC
  Administered 2013-11-29: 80 mg via ORAL
  Filled 2013-11-28: qty 2

## 2013-11-28 MED ORDER — HYDROCODONE-ACETAMINOPHEN 5-325 MG PO TABS
1.0000 | ORAL_TABLET | ORAL | Status: DC | PRN
  Administered 2013-11-28 – 2013-11-29 (×5): 2 via ORAL
  Filled 2013-11-28 (×5): qty 2

## 2013-11-28 MED ORDER — CEFAZOLIN SODIUM-DEXTROSE 2-3 GM-% IV SOLR
2.0000 g | Freq: Four times a day (QID) | INTRAVENOUS | Status: AC
  Administered 2013-11-28 – 2013-11-29 (×3): 2 g via INTRAVENOUS
  Filled 2013-11-28 (×3): qty 50

## 2013-11-28 MED ORDER — GLYCOPYRROLATE 0.2 MG/ML IJ SOLN
INTRAMUSCULAR | Status: AC
  Filled 2013-11-28: qty 2

## 2013-11-28 MED ORDER — ONDANSETRON HCL 4 MG/2ML IJ SOLN
INTRAMUSCULAR | Status: DC | PRN
Start: 2013-11-28 — End: 2013-11-28
  Administered 2013-11-28: 4 mg via INTRAVENOUS

## 2013-11-28 MED ORDER — BUPIVACAINE-EPINEPHRINE (PF) 0.25% -1:200000 IJ SOLN
INTRAMUSCULAR | Status: AC
  Filled 2013-11-28: qty 30

## 2013-11-28 MED ORDER — DEXAMETHASONE SODIUM PHOSPHATE 4 MG/ML IJ SOLN
INTRAMUSCULAR | Status: DC | PRN
  Administered 2013-11-28: 4 mg via INTRAVENOUS

## 2013-11-28 MED ORDER — PROPOFOL 10 MG/ML IV BOLUS
INTRAVENOUS | Status: DC | PRN
  Administered 2013-11-28: 150 mg via INTRAVENOUS

## 2013-11-28 MED ORDER — NEOSTIGMINE METHYLSULFATE 10 MG/10ML IV SOLN
INTRAVENOUS | Status: DC | PRN
  Administered 2013-11-28: 3 mg via INTRAVENOUS

## 2013-11-28 MED ORDER — NEOSTIGMINE METHYLSULFATE 10 MG/10ML IV SOLN
INTRAVENOUS | Status: AC
  Filled 2013-11-28: qty 1

## 2013-11-28 MED ORDER — INSULIN ASPART 100 UNIT/ML ~~LOC~~ SOLN
0.0000 [IU] | Freq: Every day | SUBCUTANEOUS | Status: DC
  Administered 2013-11-28: 4 [IU] via SUBCUTANEOUS

## 2013-11-28 MED ORDER — PIOGLITAZONE HCL 30 MG PO TABS
30.0000 mg | ORAL_TABLET | Freq: Every day | ORAL | Status: DC
  Administered 2013-11-29: 30 mg via ORAL
  Filled 2013-11-28 (×2): qty 1

## 2013-11-28 MED ORDER — ONDANSETRON HCL 4 MG/2ML IJ SOLN: 4.0000 mg | Freq: Four times a day (QID) | INTRAMUSCULAR | Status: DC | PRN

## 2013-11-28 MED ORDER — METHOCARBAMOL 1000 MG/10ML IJ SOLN: 500.0000 mg | Freq: Four times a day (QID) | INTRAVENOUS | Status: DC | PRN

## 2013-11-28 MED ORDER — MENTHOL 3 MG MT LOZG: 1.0000 | LOZENGE | OROMUCOSAL | Status: DC | PRN

## 2013-11-28 MED ORDER — HYDROCODONE-ACETAMINOPHEN 5-325 MG PO TABS: 1.0000 | ORAL_TABLET | Freq: Four times a day (QID) | ORAL | Status: DC | PRN

## 2013-11-28 MED ORDER — SODIUM CHLORIDE 0.9 % IV SOLN
INTRAVENOUS | Status: DC
  Administered 2013-11-28: 75 mL/h via INTRAVENOUS

## 2013-11-28 MED ORDER — LIDOCAINE HCL (CARDIAC) 20 MG/ML IV SOLN
INTRAVENOUS | Status: DC | PRN
  Administered 2013-11-28: 60 mg via INTRAVENOUS

## 2013-11-28 MED ORDER — INSULIN ASPART 100 UNIT/ML ~~LOC~~ SOLN: 20.0000 [IU] | Freq: Every day | SUBCUTANEOUS | Status: DC

## 2013-11-28 MED ORDER — METOCLOPRAMIDE HCL 5 MG/ML IJ SOLN: 5.0000 mg | Freq: Three times a day (TID) | INTRAMUSCULAR | Status: DC | PRN

## 2013-11-28 MED ORDER — BUPIVACAINE-EPINEPHRINE (PF) 0.5% -1:200000 IJ SOLN
INTRAMUSCULAR | Status: DC | PRN
  Administered 2013-11-28: 30 mL via PERINEURAL

## 2013-11-28 MED ORDER — ACETAMINOPHEN 650 MG RE SUPP: 650.0000 mg | Freq: Four times a day (QID) | RECTAL | Status: DC | PRN

## 2013-11-28 MED ORDER — METOCLOPRAMIDE HCL 10 MG PO TABS: 5.0000 mg | ORAL_TABLET | Freq: Three times a day (TID) | ORAL | Status: DC | PRN

## 2013-11-28 MED ORDER — LISINOPRIL-HYDROCHLOROTHIAZIDE 20-12.5 MG PO TABS: 1.0000 | ORAL_TABLET | Freq: Every day | ORAL | Status: DC

## 2013-11-28 MED ORDER — METHOCARBAMOL 500 MG PO TABS
500.0000 mg | ORAL_TABLET | Freq: Four times a day (QID) | ORAL | Status: DC | PRN
  Administered 2013-11-28 – 2013-11-29 (×3): 500 mg via ORAL
  Filled 2013-11-28 (×3): qty 1

## 2013-11-28 MED ORDER — PIOGLITAZONE HCL-METFORMIN HCL 15-850 MG PO TABS: 2.0000 | ORAL_TABLET | Freq: Every day | ORAL | Status: DC

## 2013-11-28 MED ORDER — MIDAZOLAM HCL 2 MG/2ML IJ SOLN
2.0000 mg | Freq: Once | INTRAMUSCULAR | Status: AC
  Administered 2013-11-28: 2 mg via INTRAVENOUS

## 2013-11-28 MED ORDER — BUPIVACAINE-EPINEPHRINE 0.25% -1:200000 IJ SOLN
INTRAMUSCULAR | Status: DC | PRN
  Administered 2013-11-28: 8 mL

## 2013-11-28 MED ORDER — HYDROCHLOROTHIAZIDE 12.5 MG PO CAPS
12.5000 mg | ORAL_CAPSULE | Freq: Every day | ORAL | Status: DC
  Administered 2013-11-29: 12.5 mg via ORAL
  Filled 2013-11-28: qty 1

## 2013-11-28 MED ORDER — VITAMIN D3 25 MCG (1000 UNIT) PO TABS
1000.0000 [IU] | ORAL_TABLET | Freq: Every day | ORAL | Status: DC
  Administered 2013-11-29: 1000 [IU] via ORAL
  Filled 2013-11-28: qty 1

## 2013-11-28 MED ORDER — LORAZEPAM 0.5 MG PO TABS
0.5000 mg | ORAL_TABLET | Freq: Three times a day (TID) | ORAL | Status: DC
  Administered 2013-11-28 – 2013-11-29 (×2): 0.5 mg via ORAL
  Filled 2013-11-28 (×2): qty 1

## 2013-11-28 MED ORDER — CITALOPRAM HYDROBROMIDE 20 MG PO TABS
20.0000 mg | ORAL_TABLET | Freq: Every day | ORAL | Status: DC
  Administered 2013-11-29: 20 mg via ORAL
  Filled 2013-11-28: qty 1

## 2013-11-28 MED ORDER — SODIUM CHLORIDE 0.9 % IR SOLN
Status: DC | PRN
  Administered 2013-11-28: 3000 mL

## 2013-11-28 MED ORDER — INSULIN GLARGINE 100 UNIT/ML ~~LOC~~ SOLN
110.0000 [IU] | Freq: Every day | SUBCUTANEOUS | Status: DC
  Administered 2013-11-28: 110 [IU] via SUBCUTANEOUS
  Filled 2013-11-28 (×2): qty 1.1

## 2013-11-28 MED ORDER — CHLORHEXIDINE GLUCONATE 4 % EX LIQD: 60.0000 mL | Freq: Once | CUTANEOUS | Status: DC

## 2013-11-28 MED ORDER — HYDROMORPHONE HCL 2 MG PO TABS: 2.0000 mg | ORAL_TABLET | ORAL | Status: DC | PRN

## 2013-11-28 MED ORDER — ALBUTEROL SULFATE HFA 108 (90 BASE) MCG/ACT IN AERS
2.0000 | INHALATION_SPRAY | Freq: Four times a day (QID) | RESPIRATORY_TRACT | Status: DC | PRN
  Filled 2013-11-28: qty 6.7

## 2013-11-28 MED ORDER — FENTANYL CITRATE 0.05 MG/ML IJ SOLN
INTRAMUSCULAR | Status: DC | PRN
  Administered 2013-11-28 (×2): 50 ug via INTRAVENOUS

## 2013-11-28 MED ORDER — INSULIN GLARGINE 100 UNIT/ML ~~LOC~~ SOLN
110.0000 [IU] | Freq: Every day | SUBCUTANEOUS | Status: DC
Start: 2013-11-29 — End: 2013-11-28

## 2013-11-28 MED ORDER — PHENOL 1.4 % MT LIQD: 1.0000 | OROMUCOSAL | Status: DC | PRN

## 2013-11-28 MED ORDER — INSULIN ASPART 100 UNIT/ML ~~LOC~~ SOLN
0.0000 [IU] | Freq: Three times a day (TID) | SUBCUTANEOUS | Status: DC
  Administered 2013-11-28 – 2013-11-29 (×3): 5 [IU] via SUBCUTANEOUS

## 2013-11-28 MED ORDER — PHENYLEPHRINE HCL 10 MG/ML IJ SOLN
INTRAMUSCULAR | Status: AC
  Filled 2013-11-28: qty 1

## 2013-11-28 MED ORDER — CALCIUM 600 MG PO TABS: 1800.0000 mg | ORAL_TABLET | Freq: Every day | ORAL | Status: DC

## 2013-11-28 MED ORDER — DICLOFENAC SODIUM 1 % TD GEL: 2.0000 g | Freq: Four times a day (QID) | TRANSDERMAL | Status: DC | PRN

## 2013-11-28 SURGICAL SUPPLY — 62 items
ANCHOR ALL- SUT RC 2 SUT Y-K (Anchor) ×1 IMPLANT
ANCHOR ALL-SUT FLEX 1.3 Y-KNOT (Anchor) ×2 IMPLANT
ANCHOR ALL-SUT RC 2 SUT Y-K (Anchor) ×1 IMPLANT
BIT DRILL 1.3M DISPOSABLE (BIT) ×2 IMPLANT
BLADE SURG 11 STRL SS (BLADE) ×2 IMPLANT
BLADE W/14.0X25.5MM (BLADE) ×2 IMPLANT
BUR OVAL 4.0 (BURR) ×2 IMPLANT
CLSR STERI-STRIP ANTIMIC 1/2X4 (GAUZE/BANDAGES/DRESSINGS) ×2 IMPLANT
COVER SURGICAL LIGHT HANDLE (MISCELLANEOUS) ×2 IMPLANT
DRAPE INCISE IOBAN 66X45 STRL (DRAPES) ×2 IMPLANT
DRAPE STERI 35X30 U-POUCH (DRAPES) ×2 IMPLANT
DRAPE U-SHAPE 47X51 STRL (DRAPES) ×2 IMPLANT
DRSG EMULSION OIL 3X3 NADH (GAUZE/BANDAGES/DRESSINGS) ×2 IMPLANT
DRSG PAD ABDOMINAL 8X10 ST (GAUZE/BANDAGES/DRESSINGS) ×4 IMPLANT
DURAPREP 26ML APPLICATOR (WOUND CARE) ×2 IMPLANT
ELECT NEEDLE TIP 2.8 STRL (NEEDLE) ×2 IMPLANT
ELECT REM PT RETURN 9FT ADLT (ELECTROSURGICAL)
ELECTRODE REM PT RTRN 9FT ADLT (ELECTROSURGICAL) IMPLANT
GAUZE SPONGE 4X4 12PLY STRL (GAUZE/BANDAGES/DRESSINGS) ×2 IMPLANT
GLOVE BIOGEL PI ORTHO PRO 7.5 (GLOVE) ×1
GLOVE BIOGEL PI ORTHO PRO SZ8 (GLOVE) ×1
GLOVE ORTHO TXT STRL SZ7.5 (GLOVE) ×2 IMPLANT
GLOVE PI ORTHO PRO STRL 7.5 (GLOVE) ×1 IMPLANT
GLOVE PI ORTHO PRO STRL SZ8 (GLOVE) ×1 IMPLANT
GLOVE SURG ORTHO 8.5 STRL (GLOVE) ×2 IMPLANT
GOWN STRL REUS W/ TWL XL LVL3 (GOWN DISPOSABLE) ×4 IMPLANT
GOWN STRL REUS W/TWL XL LVL3 (GOWN DISPOSABLE) ×4
KIT BASIN OR (CUSTOM PROCEDURE TRAY) ×2 IMPLANT
KIT ROOM TURNOVER OR (KITS) ×2 IMPLANT
MANIFOLD NEPTUNE II (INSTRUMENTS) ×2 IMPLANT
NDL SUT .5 MAYO 1.404X.05X (NEEDLE) ×1 IMPLANT
NDL SUT 6 .5 CRC .975X.05 MAYO (NEEDLE) ×1 IMPLANT
NEEDLE HYPO 25GX1X1/2 BEV (NEEDLE) ×2 IMPLANT
NEEDLE MAYO TAPER (NEEDLE) ×2
NEEDLE SPNL 18GX3.5 QUINCKE PK (NEEDLE) ×2 IMPLANT
NS IRRIG 1000ML POUR BTL (IV SOLUTION) ×2 IMPLANT
PACK SHOULDER (CUSTOM PROCEDURE TRAY) ×2 IMPLANT
PAD ARMBOARD 7.5X6 YLW CONV (MISCELLANEOUS) ×4 IMPLANT
RESECTOR FULL RADIUS 4.2MM (BLADE) ×2 IMPLANT
SET ARTHROSCOPY TUBING (MISCELLANEOUS) ×1
SET ARTHROSCOPY TUBING LN (MISCELLANEOUS) ×1 IMPLANT
SLING ARM LRG ADULT FOAM STRAP (SOFTGOODS) ×2 IMPLANT
SLING ARM MED ADULT FOAM STRAP (SOFTGOODS) IMPLANT
SPONGE GAUZE 4X4 12PLY STER LF (GAUZE/BANDAGES/DRESSINGS) ×2 IMPLANT
STRIP CLOSURE SKIN 1/2X4 (GAUZE/BANDAGES/DRESSINGS) ×2 IMPLANT
SUT BONE WAX W31G (SUTURE) ×2 IMPLANT
SUT FIBERWIRE #2 38 T-5 BLUE (SUTURE)
SUT MNCRL AB 3-0 PS2 18 (SUTURE) ×2 IMPLANT
SUT VIC AB 0 CT1 27 (SUTURE) ×1
SUT VIC AB 0 CT1 27XBRD ANBCTR (SUTURE) ×1 IMPLANT
SUT VIC AB 0 CT2 27 (SUTURE) ×4 IMPLANT
SUT VIC AB 2-0 CT1 27 (SUTURE) ×1
SUT VIC AB 2-0 CT1 TAPERPNT 27 (SUTURE) ×1 IMPLANT
SUT VICRYL 0 CT 1 36IN (SUTURE) ×2 IMPLANT
SUTURE FIBERWR #2 38 T-5 BLUE (SUTURE) IMPLANT
SYR CONTROL 10ML LL (SYRINGE) ×2 IMPLANT
TAPE CLOTH SURG 6X10 WHT LF (GAUZE/BANDAGES/DRESSINGS) ×2 IMPLANT
TOWEL OR 17X24 6PK STRL BLUE (TOWEL DISPOSABLE) ×2 IMPLANT
TOWEL OR 17X26 10 PK STRL BLUE (TOWEL DISPOSABLE) ×2 IMPLANT
TUBE CONNECTING 12X1/4 (SUCTIONS) ×2 IMPLANT
WAND HAND CNTRL MULTIVAC 90 (MISCELLANEOUS) ×2 IMPLANT
WATER STERILE IRR 1000ML POUR (IV SOLUTION) ×2 IMPLANT

## 2013-11-28 NOTE — Interval H&P Note (Signed)
History and Physical Interval Note:  11/28/2013 9:52 AM  Whitney [name]  has presented today for surgery, with the diagnosis of LEFT SHOULDER AC DEGENRATIVE JOINT DISEASE ROTATOR CUFF TEAR  BICEP TEAR IMPINGEMENT   The various methods of treatment have been discussed with the patient and family. After consideration of risks, benefits and other options for treatment, the patient has consented to  Procedure(s): LEFT SHOULDER ARTHROSCOPY WITH SUBACROMIAL DECOMPRESSION  BICEP TENODESIS MINI OPEN ROTATOR CUFF REPAIR, OPEN DISTAL CLAVICLE RESECTION  (Left) as a surgical intervention .  The patient's history has been reviewed, patient examined, no change in status, stable for surgery.  I have reviewed the patient's chart and labs.  Questions were answered to the patient's satisfaction.     Nikan Ellingson,STEVEN R

## 2013-11-28 NOTE — Progress Notes (Signed)
Orthopedic Tech Progress Note Patient Details:  Whitney [name] 1945/07/05 [id number]  Ortho Devices Type of Ortho Device: Shoulder abduction pillow Ortho Device/Splint Interventions: [name], [name] 11/28/2013, 12:48 PM

## 2013-11-28 NOTE — Brief Op Note (Signed)
11/28/2013  12:52 PM  PATIENT:  Whitney [name]  68 y.o. female  PRE-OPERATIVE DIAGNOSIS:  LEFT SHOULDER AC DEGENRATIVE JOINT DISEASE ROTATOR CUFF TEAR  BICEP TEAR IMPINGEMENT   POST-OPERATIVE DIAGNOSIS:  LEFT SHOULDER AC DEGENRATIVE JOINT DISEASE, RCT, SLAP lesion, pan labral tear, glenohumeral DJD, impingement  PROCEDURE:  Procedure(s): LEFT SHOULDER ARTHROSCOPY WITH SUBACROMIAL DECOMPRESSION  BICEP TENODESIS MINI OPEN ROTATOR CUFF REPAIR, OPEN DISTAL CLAVICLE RESECTION  (Left) labral debridement and chondroplasty  SURGEON:  Surgeon(s) and Role:    * Augustin Schooling, MD - Primary  PHYSICIAN ASSISTANT:   ASSISTANTS: Ventura Bruns, PA-C   ANESTHESIA:   regional and general  EBL:  Total I/O In: 1200 [I.V.:1200] Out: 50 [Blood:50]  BLOOD ADMINISTERED:none  DRAINS: none   LOCAL MEDICATIONS USED:  MARCAINE     SPECIMEN:  No Specimen  DISPOSITION OF SPECIMEN:  N/A  COUNTS:  YES  TOURNIQUET:  * No tourniquets in log *  DICTATION: .Other Dictation: Dictation Number [id number]  PLAN OF CARE: Admit for overnight observation  PATIENT DISPOSITION:  PACU - hemodynamically stable.   Delay start of Pharmacological VTE agent (>24hrs) due to surgical blood loss or risk of bleeding: no

## 2013-11-28 NOTE — Anesthesia Procedure Notes (Addendum)
Anesthesia Regional Block:  Interscalene brachial plexus block  Pre-Anesthetic Checklist: ,, timeout performed, Correct Patient, Correct Site, Correct Laterality, Correct Procedure, Correct Position, site marked, Risks and benefits discussed,  Surgical consent,  Pre-op evaluation,  At surgeon's request and post-op pain management  Laterality: Left  Prep: chloraprep       Needles:  Injection technique: Single-shot  Needle Type: Echogenic Stimulator Needle     Needle Length: 5cm 5 cm Needle Gauge: 22 and 22 G    Additional Needles:  Procedures: ultrasound guided (picture in chart) and nerve stimulator Interscalene brachial plexus block  Nerve Stimulator or Paresthesia:  Response: biceps flexion, 0.45 mA,   Additional Responses:   Narrative:  Start time: 11/28/2013 9:25 AM End time: 11/28/2013 9:35 AM Injection made incrementally with aspirations every 5 mL.  Performed by: Personally   Additional Notes: Functioning IV was confirmed and monitors were applied.  A 46mm 22ga Arrow echogenic stimulator needle was used. Sterile prep and drape,hand hygiene and sterile gloves were used.  Negative aspiration and negative test dose prior to incremental administration of local anesthetic. The patient tolerated the procedure well.  Ultrasound guidance: relevent anatomy identified, needle position confirmed, local anesthetic spread visualized around nerve(s), vascular puncture avoided.  Image printed for medical record.    Procedure Name: Intubation Date/Time: 11/28/2013 10:43 AM Performed by: Melina Copa, Di Jasmer R Pre-anesthesia Checklist: Patient identified, Emergency Drugs available, Suction available, Patient being monitored and Timeout performed Patient Re-evaluated:Patient Re-evaluated prior to inductionOxygen Delivery Method: Circle system utilized Preoxygenation: Pre-oxygenation with 100% oxygen Intubation Type: IV induction Ventilation: Mask ventilation without  difficulty Laryngoscope Size: Mac and 3 Grade View: Grade II Tube type: Oral Tube size: 7.5 mm Number of attempts: 1 Airway Equipment and Method: Stylet Placement Confirmation: ETT inserted through vocal cords under direct vision,  positive ETCO2 and breath sounds checked- equal and bilateral Secured at: 21 cm Tube secured with: Tape Dental Injury: Teeth and Oropharynx as per pre-operative assessment

## 2013-11-28 NOTE — Progress Notes (Signed)
Pt has petechia all over right hand front and back, denies itching or pain, blood pressure cuff removed from R.Arm, Dr. Marcie Bal notified, will come to evaluate the patient

## 2013-11-28 NOTE — Anesthesia Postprocedure Evaluation (Signed)
Anesthesia Post Note  Patient: Whitney [name]  Procedure(s) Performed: Procedure(s) (LRB): LEFT SHOULDER ARTHROSCOPY WITH SUBACROMIAL DECOMPRESSION  BICEP TENODESIS MINI OPEN ROTATOR CUFF REPAIR, OPEN DISTAL CLAVICLE RESECTION  (Left)  Anesthesia type: General  Patient location: PACU  Post pain: Pain level controlled and Adequate analgesia  Post assessment: Post-op Vital signs reviewed, Patient's Cardiovascular Status Stable, Respiratory Function Stable, Patent Airway and Pain level controlled  Last Vitals:  Filed Vitals:   11/28/13 1415  BP:   Pulse: 80  Temp:   Resp: 14    Post vital signs: Reviewed and stable  Level of consciousness: awake, alert  and oriented  Complications: No apparent anesthesia complications

## 2013-11-28 NOTE — Transfer of Care (Signed)
Immediate Anesthesia Transfer of Care Note  Patient: Whitney [name]  Procedure(s) Performed: Procedure(s): LEFT SHOULDER ARTHROSCOPY WITH SUBACROMIAL DECOMPRESSION  BICEP TENODESIS MINI OPEN ROTATOR CUFF REPAIR, OPEN DISTAL CLAVICLE RESECTION  (Left)  Patient Location: PACU  Anesthesia Type:GA combined with regional for post-op pain  Level of Consciousness: awake, alert  and oriented  Airway & Oxygen Therapy: Patient Spontanous Breathing and Patient connected to nasal cannula oxygen  Post-op Assessment: Report given to PACU RN, Post -op Vital signs reviewed and stable and Patient moving all extremities  Post vital signs: Reviewed and stable  Complications: No apparent anesthesia complications

## 2013-11-28 NOTE — Progress Notes (Signed)
Pt seen by Dr.Hodierne, ok to transfer to floor, no new orders rec'd, will tell RN on floor to monitor hand for changes

## 2013-11-28 NOTE — Anesthesia Preprocedure Evaluation (Addendum)
Anesthesia Evaluation  Patient identified by MRN, date of birth, ID band Patient awake    Reviewed: Allergy & Precautions, H&P , NPO status , Patient's Chart, lab work & pertinent test results  History of Anesthesia Complications (+) PONV  Airway Mallampati: II   Neck ROM: full    Dental   Pulmonary          Cardiovascular hypertension,     Neuro/Psych Anxiety Depression CVA    GI/Hepatic GERD-  ,  Endo/Other  diabetes, Type 2obese  Renal/GU      Musculoskeletal   Abdominal   Peds  Hematology   Anesthesia Other Findings   Reproductive/Obstetrics                            Anesthesia Physical Anesthesia Plan  ASA: III  Anesthesia Plan: General and Regional   Post-op Pain Management: MAC Combined w/ Regional for Post-op pain   Induction: Intravenous  Airway Management Planned: Oral ETT  Additional Equipment:   Intra-op Plan:   Post-operative Plan: Extubation in OR  Informed Consent: I have reviewed the patients History and Physical, chart, labs and discussed the procedure including the risks, benefits and alternatives for the proposed anesthesia with the patient or authorized representative who has indicated his/her understanding and acceptance.     Plan Discussed with: CRNA, Anesthesiologist and Surgeon  Anesthesia Plan Comments:         Anesthesia Quick Evaluation

## 2013-11-28 NOTE — Progress Notes (Signed)
Orthopedic Tech Progress Note Patient Details:  Whitney [name] 1946-01-02 [id number]  Patient ID: Whitney [name], female   DOB: Dec 23, 1945, 68 y.o.   MRN: [id number] As ordered by Dr. Margaretha Seeds, Whitney [name] 11/28/2013, 12:48 PM

## 2013-11-29 DIAGNOSIS — M19012 Primary osteoarthritis, left shoulder: Secondary | ICD-10-CM | POA: Diagnosis not present

## 2013-11-29 LAB — BASIC METABOLIC PANEL
Anion gap: 18 — ABNORMAL HIGH (ref 5–15)
BUN: 14 mg/dL (ref 6–23)
CO2: 19 mEq/L (ref 19–32)
Calcium: 8.9 mg/dL (ref 8.4–10.5)
Chloride: 96 mEq/L (ref 96–112)
Creatinine, Ser: 0.9 mg/dL (ref 0.50–1.10)
GFR calc Af Amer: 74 mL/min — ABNORMAL LOW (ref >90)
GFR calc non Af Amer: 64 mL/min — ABNORMAL LOW (ref >90)
Glucose, Bld: 260 mg/dL — ABNORMAL HIGH (ref 70–99)
Potassium: 4.3 mEq/L (ref 3.7–5.3)
Sodium: 133 mEq/L — ABNORMAL LOW (ref 137–147)

## 2013-11-29 LAB — HEMOGLOBIN AND HEMATOCRIT, BLOOD
HCT: 36.4 % (ref 36.0–46.0)
Hemoglobin: 12.2 g/dL (ref 12.0–15.0)

## 2013-11-29 LAB — GLUCOSE, CAPILLARY
GLUCOSE-CAPILLARY: 233 mg/dL — AB (ref 70–99)
GLUCOSE-CAPILLARY: 241 mg/dL — AB (ref 70–99)

## 2013-11-29 NOTE — Progress Notes (Signed)
UR completed 

## 2013-11-29 NOTE — Progress Notes (Signed)
Whitney [name]  MRN: [id number] DOB/Age: 1945/05/25 68 y.o. Physician: Rada Hay Procedure: Procedure(s) (LRB): LEFT SHOULDER ARTHROSCOPY WITH SUBACROMIAL DECOMPRESSION  BICEP TENODESIS MINI OPEN ROTATOR CUFF REPAIR, OPEN DISTAL CLAVICLE RESECTION  (Left)     Subjective: Up with OT. Pain controlled. No specific complaints  Vital Signs Temp:  [97.8 F (36.6 C)-98.3 F (36.8 C)] 98.2 F (36.8 C) (11/07 0500) Pulse Rate:  [80-96] 87 (11/07 0500) Resp:  [14-25] 17 (11/07 0500) BP: (123-189)/(48-78) 144/69 mmHg (11/07 0500) SpO2:  [91 %-99 %] 92 % (11/07 0500)  Lab Results  Recent Labs  11/29/13 0511  HGB 12.2  HCT 36.4   BMET  Recent Labs  11/29/13 0511  NA 133*  K 4.3  CL 96  CO2 19  GLUCOSE 260*  BUN 14  CREATININE 0.90  CALCIUM 8.9   No results found for: INR   Exam Moving hand well, sensation intact Dressing dry        Plan Discharge home later today when husband gets off work  [facility] for [place] 11/29/2013, 9:30 AM

## 2013-11-29 NOTE — Plan of Care (Signed)
Problem: Phase I Progression Outcomes Goal: CMS/Neurovascular status WDL Outcome: Completed/Met Date Met:  11/29/13 Goal: Pain controlled with appropriate interventions Outcome: Completed/Met Date Met:  11/29/13 Goal: Dangle or out of bed evening of surgery Outcome: Completed/Met Date Met:  11/29/13 Goal: Initial discharge plan identified Outcome: Completed/Met Date Met:  11/29/13 Goal: Hemodynamically stable Outcome: Completed/Met Date Met:  11/29/13 Goal: Other Phase I Outcomes/Goals Outcome: Completed/Met Date Met:  11/29/13  Problem: Phase II Progression Outcomes Goal: Ambulates Outcome: Completed/Met Date Met:  11/29/13 Goal: Tolerating diet Outcome: Completed/Met Date Met:  11/29/13

## 2013-11-29 NOTE — Evaluation (Addendum)
Occupational Therapy Evaluation Patient Details Name: Whitney [name] MRN: [id number] DOB: 07/01/1945 Today's Date: 11/29/2013    History of Present Illness  68 y.o. s/p LEFT SHOULDER ARTHROSCOPY WITH SUBACROMIAL DECOMPRESSION BICEP TENODESIS MINI OPEN ROTATOR CUFF REPAIR, OPEN DISTAL CLAVICLE RESECTION     Clinical Impression   Pt s/p above. Education provided and handout given. Feel pt is safe to d/c home with family available to assist.    Follow Up Recommendations       Equipment Recommendations  None recommended by OT    Recommendations for Other Services       Precautions / Restrictions Precautions Precautions: Shoulder Type of Shoulder Precautions: ADL education, sling education; active elbow, wrist, hand ROM; pendulums; Active/Assist shoulder ROM within limits of pain; Rehabilitation per Dr Veverly Fells' protocol. Shoulder Interventions: Shoulder sling/immobilizer;Off for dressing/bathing/exercises Precaution Booklet Issued: Yes (comment) Precaution Comments: educated on precautions Required Braces or Orthoses: Sling Restrictions Weight Bearing Restrictions: Yes LUE Weight Bearing: Non weight bearing      Mobility Bed Mobility Overal bed mobility: Needs Assistance Bed Mobility: Supine to Sit     Supine to sit: Supervision        Transfers Overall transfer level: Needs assistance   Transfers: Sit to/from Stand Sit to Stand: Min guard;Supervision              Balance                                            ADL Overall ADL's : Needs assistance/impaired                         Toilet Transfer: Supervision/safety;Ambulation (bed)           Functional mobility during ADLs: Supervision/safety General ADL Comments: Reviewed ADL information (see shoulder section below). Educated on safety such as recommended sitting for LB ADLs, chair behind her with pendulums. Pt states she sometimes drops utensils due to arthritis-gave pt  foam for utensils and showed her how it works.      Vision                     Perception     Praxis      Pertinent Vitals/Pain Pain Assessment: 0-10 Pain Score: 6  Pain Location: LUE Pain Intervention(s): Monitored during session     Hand Dominance Right   Extremity/Trunk Assessment Upper Extremity Assessment Upper Extremity Assessment: LUE deficits/detail/ RUE deficits/detail LUE Sensation: decreased light touch LUE: arthritis/weakness in hand  RUE: arthritis/weakness in hand   Lower Extremity Assessment Lower Extremity Assessment: WFL for tasks assessed        Communication Communication Communication: No difficulties   Cognition Arousal/Alertness: Awake/alert Behavior During Therapy: WFL for tasks assessed/performed Overall Cognitive Status: Within Functional Limits for tasks assessed                     General Comments       Exercises Exercises: Shoulder;Other exercises Other Exercises Other Exercises: approximatley 10 reps lap slides with LUE Other Exercises: approximately 10 reps of each pendulum Other Exercises: approximately 10 reps of left AAROM elbow flexion/extension, approximately 10 reps left AROM digit composite flexion/extension, approximately 10 reps left AROM wrist flexion/extension, 10 reps left AROM supination/pronation.   Shoulder Instructions Shoulder Instructions Donning/doffing shirt without moving shoulder: Supervision (pt able to verbalize technique  and shirt type) Donning/doffing sling/immobilizer:  (OT educated/demonstrated) Correct positioning of sling/immobilizer:  (OT educated/demonstrated-pt able to verbalize some key points and assisted with snapping snaps) Pendulum exercises (written home exercise program): Moderate assistance (assisted at hips) ROM for elbow, wrist and digits of operated UE: Minimal assistance Sling wearing schedule (on at all times/off for ADL's):  (educated) Proper positioning of operated UE  when showering:  (educated) Positioning of UE while sleeping: Patient able to independently direct caregiver (educated)    Home Living Family/patient expects to be discharged to:: Private residence Living Arrangements: Spouse/significant other;Other relatives (grandson) Available Help at Discharge: Family;Available PRN/intermittently Type of Home: House Home Access: Ramped entrance (4 steps on one entrance)     Home Layout: Other (Comment) (1 and 1/2 level)     Bathroom Shower/Tub: Occupational psychologist: Handicapped height     Home Equipment: Shower seat;Grab bars - toilet          Prior Functioning/Environment Level of Independence: Needs assistance    ADL's / Homemaking Assistance Needed: assist opening containers        OT Diagnosis: Acute pain   OT Problem List:     OT Treatment/Interventions:      OT Goals(Current goals can be found in the care plan section)    OT Frequency:     Barriers to D/C:            Co-evaluation              End of Session Equipment Utilized During Treatment: Gait belt;Other (comment) (sling)  Activity Tolerance: Patient tolerated treatment well Patient left: in bed;with call bell/phone within reach   Time: [id number] OT Time Calculation (min): 30 min Charges:  OT General Charges $OT Visit: 1 Procedure OT Evaluation $Initial OT Evaluation Tier I: 1 Procedure OT Treatments $Therapeutic Exercise: 8-22 mins G-Codes: OT G-codes **NOT FOR INPATIENT CLASS** Functional Assessment Tool Used: clinical judgment Functional Limitation: Self care Self Care Current Status [id number]): At least 40 percent but less than 60 percent impaired, limited or restricted Self Care Goal Status [id number]): At least 40 percent but less than 60 percent impaired, limited or restricted Self Care Discharge Status [phone]): At least 40 percent but less than 60 percent impaired, limited or restricted  Benito Mccreedy OTR/L [id number] 11/29/2013,  1:27 PM

## 2013-11-29 NOTE — Op Note (Signed)
[name], [name]               ACCOUNT NO.:  [id number]  MEDICAL RECORD NO.:  [id number]  LOCATION:  [id number]                        FACILITY:  [facility]  PHYSICIAN:  Doran Heater. Veverly Fells, M.D. DATE OF BIRTH:  May 30, 1945  DATE OF PROCEDURE:  11/28/2013 DATE OF DISCHARGE:                              OPERATIVE REPORT   PREOPERATIVE DIAGNOSES:  Left shoulder pain secondary to rotator cuff tear, superior labrum anterior to posterior lesion and acromioclavicular joint arthritis.  POSTOPERATIVE DIAGNOSES:  Left shoulder pain secondary to rotator cuff tear, superior labrum anterior to posterior lesion and acromioclavicular joint arthritis as well as glenohumeral chondromalacia of grade 3, also severe impingement syndrome and pan-labral tearing.  PROCEDURES PERFORMED:  Left shoulder arthroscopy with extensive intra- articular debridement of torn superior labrum anterior to posterior as well as pan-labral tear debridement, arthroscopic chondroplasty of glenohumeral joint, arthroscopic subacromial decompression with mini open rotator cuff repair, biceps tenodesis in the groove, and open distal clavicle resection.  ATTENDING SURGEON:  Doran Heater. Veverly Fells, M.D.  ASSISTANT:  Abbott Pao. [place], P.A.-C, who was scrubbed the entire procedure and necessary for satisfactory completion of surgery.  ANESTHESIA:  General anesthesia was used plus interscalene block.  ESTIMATED BLOOD LOSS:  Minimal.  FLUID REPLACEMENT:  1500 mL of crystalloid.  INSTRUMENT COUNTS:  Correct.  COMPLICATIONS:  There were no complications.  ANTIBIOTICS:  Perioperative antibiotics were given.  INDICATIONS:  The patient is a 67 year old female with worsening left shoulder pain secondary to an MRI documented rotator cuff tear SLAP lesion, AC joint arthritis.  The patient had had prior open subacromial decompression in the past, has had worsening pain recently, MRI scan confirming tears.  The patient has failed all measures of  conservative management and now desires operative treatment to restore function, eliminate pain to the shoulder, and informed consent obtained.  DESCRIPTION OF PROCEDURE:  After an adequate level of anesthesia was achieved, the patient was positioned in the modified beach-chair position.  Left shoulder was correctly identified and sterilely prepped and draped in usual manner.  Time-out was called.  We entered the shoulder using standard arthroscopic portals including anterior, posterior, and lateral portals.  We identified advanced tearing of the superior labrum and biceps anchor.  Compromising the anchor point, we performed a biceps tenotomy and labral debridement back to stable labral rim.  There was anterior-inferior and posterior-inferior labral tearing. We did basically circumferential labral debridement.  There was also advanced chondromalacia noted on the glenoid face as well as the humeral head.  We performed a tangential chondroplasty, and careful not to remove any healthy cartilage, but smoothing down rough areas.  The subscapularis was intact.  The rotator cuff had what appeared to be a high-grade partial-thickness tear involving supraspinatus and infraspinatus tendons.  The teres minor appeared to be intact.  We reversed the scope, looking posteriorly and did a final inspection of the joint.  Once we concluded the chondroplasty, labral debridement and biceps tenotomy, we placed a scope in the subacromial space and quite a bit of bursitis was noted and scar tissue in that area from the prior open surgery.  We did a thorough subacromial and subdeltoid decompression with the suction shaver, the ArthroCare  unit and the high- speed bur.  We creating a type 1 acromial shape, there were extremely large spurs present under the acromion and at the [facility] joint margin.  We had a nice decompression of rotator cuff outlet, identified no full- thickness tears from the bursal surface, but  definitely some soft spots posterior to the biceps groove.  Once we completed our subdeltoid subacromial decompression, we concluded the arthroscopic portion of the procedure.  We made a small saber incision overlying the AC joint, dissection down through the subcutaneous tissues using needle-tip Bovie, identified deltotrapezial fascia and divided that in line with distal clavicle.  Subperiosteal dissection of distal clavicle performed followed by excision of distal 2-3 mm of bone using an oscillating saw. We applied bone wax to the cut into the clavicle.  We checked to make sure AC ligaments of both anterior and posterior were preserved.  We then thoroughly irrigated and repaired deltotrapezial fascia with 0 Vicryl suture followed by 2-0 Vicryl for subcutaneous closure and 4-0 Monocryl for skin.  We also then went after the rotator cuff and biceps. The patient had a prior incision anterior to the acromion.  We utilized the bottom portion of that to create a mini open incision, tried to get that raphe between the anterior and lateral heads of the deltoid.  We developed the little bit of the skin flap, we got to that deltoid perfect, developed that using needle tip Bovie.  We placed Arthrex retractor and then identified the biceps groove.  We delivered the biceps tendon out of the groove, prepared at the floor of the biceps groove with a needle-tip Bovie and a Soil scientist.  We placed a single Y-Knot Flex anchor through the floor of the biceps groove, brought that suture up in a whipstitch fashion through the biceps tendon, tenodesis in the tendon, mid tension with the elbow at 90 degrees.  We then addressed the rotator cuff tear, which was palpable just posterior to the biceps groove.  We released the remaining tendon, which was maybe 20% thickness of the tendon.  Created a full-thickness rotator cuff terminal end that we could repair to the rotator cuff footprint.  We freshened up the  footprint with a rongeur.  We then placed a Y-Knot RC anchor, double-loaded with #2 Hi-Fi suture at the articular margin and brought that up in two mattress sutures for the medial portion of the footprint, then took four sutures through the lateral edge of the rotator cuff, which had decent thickness and took that down through drill holes in the bone out the lateral humeral cortex to reapply the lateral portion of the footprint.  We had nice repair, not under significant tension as we ranged her shoulder through a full arc of motion, no impingement was noted.  We thoroughly irrigated the repaired deltoid anatomically with 0 Vicryl suture followed by 2-0 Vicryl for subcutaneous closure and 4-0 Monocryl for skin and portals.  We felt that the deltoid was actually in pretty good condition better than I thought based on her MRI from her prior surgery.  The patient tolerated the surgery well, was taken to the recovery room in stable condition.     Doran Heater. Veverly Fells, M.D.     SRN/MEDQ  D:  11/28/2013  T:  11/29/2013  Job:  [id number]

## 2013-12-02 ENCOUNTER — Encounter (HOSPITAL_COMMUNITY): Payer: Self-pay | Admitting: Orthopedic Surgery

## 2013-12-10 NOTE — Discharge Summary (Signed)
Physician Discharge Summary   Patient ID: Whitney [name] MRN: [id number] DOB/AGE: 1945-09-12 68 y.o.  Admit date: 11/28/2013 Discharge date: 12/10/2013  Admission Diagnoses:  Active Problems:   RCT (rotator cuff tear)   Discharge Diagnoses:  Same   Surgeries: Procedure(s): LEFT SHOULDER ARTHROSCOPY WITH SUBACROMIAL DECOMPRESSION  BICEP TENODESIS MINI OPEN ROTATOR CUFF REPAIR, OPEN DISTAL CLAVICLE RESECTION  on 11/28/2013   Consultants: none  Discharged Condition: Stable  Hospital Course: Whitney [name] is an 68 y.o. female who was admitted 11/28/2013 with a chief complaint of left shoulder pain, and found to have a diagnosis of left shoulder rotator cuff tear, AC DJD.  They were brought to the operating room on 11/28/2013 and underwent the above named procedures.    The patient had an uncomplicated hospital course and was stable for discharge.  Recent vital signs:  Filed Vitals:   11/29/13 0500  BP: 144/69  Pulse: 87  Temp: 98.2 F (36.8 C)  Resp: 17    Recent laboratory studies:  Results for orders placed or performed during the hospital encounter of 11/28/13  Glucose, capillary  Result Value Ref Range   Glucose-Capillary 149 (H) 70 - 99 mg/dL  Glucose, capillary  Result Value Ref Range   Glucose-Capillary 189 (H) 70 - 99 mg/dL   Comment 1 Documented in Chart    Comment 2 Notify RN   Glucose, capillary  Result Value Ref Range   Glucose-Capillary 210 (H) 70 - 99 mg/dL   Comment 1 Notify RN   Hemoglobin and hematocrit, blood  Result Value Ref Range   Hemoglobin 12.2 12.0 - 15.0 g/dL   HCT 36.4 36.0 - 40.8 %  Basic metabolic panel  Result Value Ref Range   Sodium 133 (L) 137 - 147 mEq/L   Potassium 4.3 3.7 - 5.3 mEq/L   Chloride 96 96 - 112 mEq/L   CO2 19 19 - 32 mEq/L   Glucose, Bld 260 (H) 70 - 99 mg/dL   BUN 14 6 - 23 mg/dL   Creatinine, Ser 0.90 0.50 - 1.10 mg/dL   Calcium 8.9 8.4 - 10.5 mg/dL   GFR calc non Af Amer 64 (L) >90 mL/min   GFR calc Af Amer  74 (L) >90 mL/min   Anion gap 18 (H) 5 - 15  Glucose, capillary  Result Value Ref Range   Glucose-Capillary 318 (H) 70 - 99 mg/dL  Glucose, capillary  Result Value Ref Range   Glucose-Capillary 233 (H) 70 - 99 mg/dL   Comment 1 Notify RN   Glucose, capillary  Result Value Ref Range   Glucose-Capillary 241 (H) 70 - 99 mg/dL    Discharge Medications:     Medication List    TAKE these medications        albuterol 108 (90 BASE) MCG/ACT inhaler  Commonly known as:  PROVENTIL HFA;VENTOLIN HFA  Inhale 2 puffs into the lungs every 6 (six) hours as needed for wheezing or shortness of breath.     CALCIUM PO  Take 1 tablet by mouth daily.     celecoxib 200 MG capsule  Commonly known as:  CELEBREX  Take 200 mg by mouth daily.     citalopram 20 MG tablet  Commonly known as:  CELEXA  Take 20 mg by mouth daily.     diclofenac sodium 1 % Gel  Commonly known as:  VOLTAREN  Apply 2 g topically 4 (four) times daily as needed (for pain).     HYDROcodone-acetaminophen 5-325 MG per tablet  Commonly known as:  NORCO  Take 1-2 tablets by mouth every 6 (six) hours as needed for moderate pain.     HYDROmorphone 2 MG tablet  Commonly known as:  DILAUDID  Take 1 tablet (2 mg total) by mouth every 4 (four) hours as needed for severe pain.     insulin aspart 100 UNIT/ML injection  Commonly known as:  novoLOG  Inject 20 Units into the skin daily.     insulin glargine 100 UNIT/ML injection  Commonly known as:  LANTUS  Inject 110 Units into the skin at bedtime.     KRILL OIL PLUS PO  Take 1 capsule by mouth daily.     lisinopril-hydrochlorothiazide 20-12.5 MG per tablet  Commonly known as:  PRINZIDE,ZESTORETIC  Take 1 tablet by mouth daily.     LORazepam 0.5 MG tablet  Commonly known as:  ATIVAN  Take 0.5 mg by mouth 3 (three) times daily.     methocarbamol 500 MG tablet  Commonly known as:  ROBAXIN  Take 1 tablet (500 mg total) by mouth 3 (three) times daily as needed.      omeprazole 40 MG capsule  Commonly known as:  PRILOSEC  Take 40 mg by mouth daily.     pioglitazone-metformin 15-850 MG per tablet  Commonly known as:  ACTOPLUS MET  Take 2 tablets by mouth daily.     rosuvastatin 20 MG tablet  Commonly known as:  CRESTOR  Take 20 mg by mouth daily.     VITAMIN D PO  Take 1 tablet by mouth daily.        Diagnostic Studies: No results found.  Disposition: 01-Home or Self Care        Follow-up Information    Follow up with Megann Easterwood,STEVEN R, MD. Call in 2 weeks.   Specialty:  Orthopedic Surgery   Why:  [phone]   Contact information:   [address] [place] [address] [phone]        Signed: Augustin Schooling 12/10/2013, 1:10 PM

## 2015-02-24 DIAGNOSIS — R05 Cough: Secondary | ICD-10-CM | POA: Diagnosis not present

## 2015-04-19 DIAGNOSIS — E042 Nontoxic multinodular goiter: Secondary | ICD-10-CM | POA: Diagnosis not present

## 2015-07-15 DIAGNOSIS — Z794 Long term (current) use of insulin: Secondary | ICD-10-CM | POA: Diagnosis not present

## 2015-07-15 DIAGNOSIS — E042 Nontoxic multinodular goiter: Secondary | ICD-10-CM | POA: Diagnosis not present

## 2015-07-15 DIAGNOSIS — I10 Essential (primary) hypertension: Secondary | ICD-10-CM | POA: Diagnosis not present

## 2015-07-15 DIAGNOSIS — E1165 Type 2 diabetes mellitus with hyperglycemia: Secondary | ICD-10-CM | POA: Diagnosis not present

## 2015-08-10 DIAGNOSIS — Z9181 History of falling: Secondary | ICD-10-CM | POA: Diagnosis not present

## 2015-08-10 DIAGNOSIS — I10 Essential (primary) hypertension: Secondary | ICD-10-CM | POA: Diagnosis not present

## 2015-08-10 DIAGNOSIS — E785 Hyperlipidemia, unspecified: Secondary | ICD-10-CM | POA: Diagnosis not present

## 2015-08-10 DIAGNOSIS — Z1389 Encounter for screening for other disorder: Secondary | ICD-10-CM | POA: Diagnosis not present

## 2015-08-10 DIAGNOSIS — F329 Major depressive disorder, single episode, unspecified: Secondary | ICD-10-CM | POA: Diagnosis not present

## 2015-08-10 DIAGNOSIS — R002 Palpitations: Secondary | ICD-10-CM | POA: Diagnosis not present

## 2015-08-10 DIAGNOSIS — M159 Polyosteoarthritis, unspecified: Secondary | ICD-10-CM | POA: Diagnosis not present

## 2015-08-10 DIAGNOSIS — Z79899 Other long term (current) drug therapy: Secondary | ICD-10-CM | POA: Diagnosis not present

## 2015-08-25 DIAGNOSIS — K219 Gastro-esophageal reflux disease without esophagitis: Secondary | ICD-10-CM | POA: Diagnosis not present

## 2015-08-25 DIAGNOSIS — K2270 Barrett's esophagus without dysplasia: Secondary | ICD-10-CM | POA: Diagnosis not present

## 2015-08-25 DIAGNOSIS — K210 Gastro-esophageal reflux disease with esophagitis: Secondary | ICD-10-CM | POA: Diagnosis not present

## 2015-10-15 DIAGNOSIS — B373 Candidiasis of vulva and vagina: Secondary | ICD-10-CM | POA: Diagnosis not present

## 2015-10-15 DIAGNOSIS — I10 Essential (primary) hypertension: Secondary | ICD-10-CM | POA: Diagnosis not present

## 2015-10-15 DIAGNOSIS — E1165 Type 2 diabetes mellitus with hyperglycemia: Secondary | ICD-10-CM | POA: Diagnosis not present

## 2015-10-15 DIAGNOSIS — Z794 Long term (current) use of insulin: Secondary | ICD-10-CM | POA: Diagnosis not present

## 2015-10-15 DIAGNOSIS — E042 Nontoxic multinodular goiter: Secondary | ICD-10-CM | POA: Diagnosis not present

## 2015-10-26 DIAGNOSIS — Z23 Encounter for immunization: Secondary | ICD-10-CM | POA: Diagnosis not present

## 2016-01-13 DIAGNOSIS — I10 Essential (primary) hypertension: Secondary | ICD-10-CM | POA: Diagnosis not present

## 2016-01-13 DIAGNOSIS — E042 Nontoxic multinodular goiter: Secondary | ICD-10-CM | POA: Diagnosis not present

## 2016-01-13 DIAGNOSIS — Z794 Long term (current) use of insulin: Secondary | ICD-10-CM | POA: Diagnosis not present

## 2016-01-13 DIAGNOSIS — E1165 Type 2 diabetes mellitus with hyperglycemia: Secondary | ICD-10-CM | POA: Diagnosis not present

## 2016-02-16 DIAGNOSIS — I10 Essential (primary) hypertension: Secondary | ICD-10-CM | POA: Diagnosis not present

## 2016-02-16 DIAGNOSIS — Z23 Encounter for immunization: Secondary | ICD-10-CM | POA: Diagnosis not present

## 2016-02-16 DIAGNOSIS — Z0000 Encounter for general adult medical examination without abnormal findings: Secondary | ICD-10-CM | POA: Diagnosis not present

## 2016-02-16 DIAGNOSIS — M810 Age-related osteoporosis without current pathological fracture: Secondary | ICD-10-CM | POA: Diagnosis not present

## 2016-02-16 DIAGNOSIS — G5620 Lesion of ulnar nerve, unspecified upper limb: Secondary | ICD-10-CM | POA: Diagnosis not present

## 2016-04-21 DIAGNOSIS — E042 Nontoxic multinodular goiter: Secondary | ICD-10-CM | POA: Diagnosis not present

## 2016-04-21 DIAGNOSIS — E1165 Type 2 diabetes mellitus with hyperglycemia: Secondary | ICD-10-CM | POA: Diagnosis not present

## 2016-04-21 DIAGNOSIS — Z794 Long term (current) use of insulin: Secondary | ICD-10-CM | POA: Diagnosis not present

## 2016-04-21 DIAGNOSIS — I10 Essential (primary) hypertension: Secondary | ICD-10-CM | POA: Diagnosis not present

## 2016-05-11 DIAGNOSIS — Z6836 Body mass index (BMI) 36.0-36.9, adult: Secondary | ICD-10-CM | POA: Diagnosis not present

## 2016-05-11 DIAGNOSIS — M25561 Pain in right knee: Secondary | ICD-10-CM | POA: Diagnosis not present

## 2016-05-11 DIAGNOSIS — M25511 Pain in right shoulder: Secondary | ICD-10-CM | POA: Diagnosis not present

## 2016-05-11 DIAGNOSIS — M25551 Pain in right hip: Secondary | ICD-10-CM | POA: Diagnosis not present

## 2016-05-17 DIAGNOSIS — H26493 Other secondary cataract, bilateral: Secondary | ICD-10-CM | POA: Diagnosis not present

## 2016-05-17 DIAGNOSIS — H52223 Regular astigmatism, bilateral: Secondary | ICD-10-CM | POA: Diagnosis not present

## 2016-05-17 DIAGNOSIS — Z961 Presence of intraocular lens: Secondary | ICD-10-CM | POA: Diagnosis not present

## 2016-05-17 DIAGNOSIS — I10 Essential (primary) hypertension: Secondary | ICD-10-CM | POA: Diagnosis not present

## 2016-05-17 DIAGNOSIS — Z1231 Encounter for screening mammogram for malignant neoplasm of breast: Secondary | ICD-10-CM | POA: Diagnosis not present

## 2016-05-17 DIAGNOSIS — H524 Presbyopia: Secondary | ICD-10-CM | POA: Diagnosis not present

## 2016-05-17 DIAGNOSIS — E119 Type 2 diabetes mellitus without complications: Secondary | ICD-10-CM | POA: Diagnosis not present

## 2016-05-17 DIAGNOSIS — H5203 Hypermetropia, bilateral: Secondary | ICD-10-CM | POA: Diagnosis not present

## 2016-05-17 DIAGNOSIS — Z7984 Long term (current) use of oral hypoglycemic drugs: Secondary | ICD-10-CM | POA: Diagnosis not present

## 2016-06-01 DIAGNOSIS — M1711 Unilateral primary osteoarthritis, right knee: Secondary | ICD-10-CM | POA: Diagnosis not present

## 2016-06-12 DIAGNOSIS — S161XXA Strain of muscle, fascia and tendon at neck level, initial encounter: Secondary | ICD-10-CM | POA: Diagnosis not present

## 2016-07-21 DIAGNOSIS — E1165 Type 2 diabetes mellitus with hyperglycemia: Secondary | ICD-10-CM | POA: Diagnosis not present

## 2016-07-24 DIAGNOSIS — K2270 Barrett's esophagus without dysplasia: Secondary | ICD-10-CM | POA: Diagnosis not present

## 2016-07-24 DIAGNOSIS — K591 Functional diarrhea: Secondary | ICD-10-CM | POA: Diagnosis not present

## 2016-07-24 DIAGNOSIS — K210 Gastro-esophageal reflux disease with esophagitis: Secondary | ICD-10-CM | POA: Diagnosis not present

## 2016-08-24 DIAGNOSIS — E042 Nontoxic multinodular goiter: Secondary | ICD-10-CM | POA: Diagnosis not present

## 2016-08-24 DIAGNOSIS — I10 Essential (primary) hypertension: Secondary | ICD-10-CM | POA: Diagnosis not present

## 2016-08-24 DIAGNOSIS — E1165 Type 2 diabetes mellitus with hyperglycemia: Secondary | ICD-10-CM | POA: Diagnosis not present

## 2016-08-24 DIAGNOSIS — Z794 Long term (current) use of insulin: Secondary | ICD-10-CM | POA: Diagnosis not present

## 2016-09-13 DIAGNOSIS — K591 Functional diarrhea: Secondary | ICD-10-CM | POA: Diagnosis not present

## 2016-09-13 DIAGNOSIS — K2970 Gastritis, unspecified, without bleeding: Secondary | ICD-10-CM | POA: Diagnosis not present

## 2016-09-13 DIAGNOSIS — K210 Gastro-esophageal reflux disease with esophagitis: Secondary | ICD-10-CM | POA: Diagnosis not present

## 2016-09-28 DIAGNOSIS — S86819A Strain of other muscle(s) and tendon(s) at lower leg level, unspecified leg, initial encounter: Secondary | ICD-10-CM | POA: Diagnosis not present

## 2016-09-29 DIAGNOSIS — J45909 Unspecified asthma, uncomplicated: Secondary | ICD-10-CM | POA: Diagnosis not present

## 2016-09-29 DIAGNOSIS — D126 Benign neoplasm of colon, unspecified: Secondary | ICD-10-CM | POA: Diagnosis not present

## 2016-09-29 DIAGNOSIS — D124 Benign neoplasm of descending colon: Secondary | ICD-10-CM | POA: Diagnosis not present

## 2016-09-29 DIAGNOSIS — Z7982 Long term (current) use of aspirin: Secondary | ICD-10-CM | POA: Diagnosis not present

## 2016-09-29 DIAGNOSIS — K219 Gastro-esophageal reflux disease without esophagitis: Secondary | ICD-10-CM | POA: Diagnosis not present

## 2016-09-29 DIAGNOSIS — E119 Type 2 diabetes mellitus without complications: Secondary | ICD-10-CM | POA: Diagnosis not present

## 2016-09-29 DIAGNOSIS — Z79899 Other long term (current) drug therapy: Secondary | ICD-10-CM | POA: Diagnosis not present

## 2016-09-29 DIAGNOSIS — Z9049 Acquired absence of other specified parts of digestive tract: Secondary | ICD-10-CM | POA: Diagnosis not present

## 2016-09-29 DIAGNOSIS — I10 Essential (primary) hypertension: Secondary | ICD-10-CM | POA: Diagnosis not present

## 2016-09-29 DIAGNOSIS — Z86010 Personal history of colonic polyps: Secondary | ICD-10-CM | POA: Diagnosis not present

## 2016-09-29 DIAGNOSIS — Z1211 Encounter for screening for malignant neoplasm of colon: Secondary | ICD-10-CM | POA: Diagnosis not present

## 2016-09-29 DIAGNOSIS — Z794 Long term (current) use of insulin: Secondary | ICD-10-CM | POA: Diagnosis not present

## 2016-09-29 DIAGNOSIS — E079 Disorder of thyroid, unspecified: Secondary | ICD-10-CM | POA: Diagnosis not present

## 2016-09-29 DIAGNOSIS — K2270 Barrett's esophagus without dysplasia: Secondary | ICD-10-CM | POA: Diagnosis not present

## 2016-09-29 DIAGNOSIS — D122 Benign neoplasm of ascending colon: Secondary | ICD-10-CM | POA: Diagnosis not present

## 2016-09-29 DIAGNOSIS — K222 Esophageal obstruction: Secondary | ICD-10-CM | POA: Diagnosis not present

## 2016-10-24 DIAGNOSIS — I82409 Acute embolism and thrombosis of unspecified deep veins of unspecified lower extremity: Secondary | ICD-10-CM | POA: Diagnosis not present

## 2016-10-24 DIAGNOSIS — E1165 Type 2 diabetes mellitus with hyperglycemia: Secondary | ICD-10-CM | POA: Diagnosis not present

## 2016-10-24 DIAGNOSIS — Z6835 Body mass index (BMI) 35.0-35.9, adult: Secondary | ICD-10-CM | POA: Diagnosis not present

## 2016-10-24 DIAGNOSIS — M7989 Other specified soft tissue disorders: Secondary | ICD-10-CM | POA: Diagnosis not present

## 2016-10-24 DIAGNOSIS — M79661 Pain in right lower leg: Secondary | ICD-10-CM | POA: Diagnosis not present

## 2016-11-06 DIAGNOSIS — K219 Gastro-esophageal reflux disease without esophagitis: Secondary | ICD-10-CM | POA: Diagnosis not present

## 2016-11-06 DIAGNOSIS — D126 Benign neoplasm of colon, unspecified: Secondary | ICD-10-CM | POA: Diagnosis not present

## 2016-12-07 DIAGNOSIS — I82401 Acute embolism and thrombosis of unspecified deep veins of right lower extremity: Secondary | ICD-10-CM | POA: Diagnosis not present

## 2016-12-07 DIAGNOSIS — Z1339 Encounter for screening examination for other mental health and behavioral disorders: Secondary | ICD-10-CM | POA: Diagnosis not present

## 2016-12-07 DIAGNOSIS — Z1331 Encounter for screening for depression: Secondary | ICD-10-CM | POA: Diagnosis not present

## 2016-12-07 DIAGNOSIS — Z6835 Body mass index (BMI) 35.0-35.9, adult: Secondary | ICD-10-CM | POA: Diagnosis not present

## 2016-12-07 DIAGNOSIS — Z9181 History of falling: Secondary | ICD-10-CM | POA: Diagnosis not present

## 2016-12-16 DIAGNOSIS — M7989 Other specified soft tissue disorders: Secondary | ICD-10-CM | POA: Diagnosis not present

## 2016-12-16 DIAGNOSIS — I82401 Acute embolism and thrombosis of unspecified deep veins of right lower extremity: Secondary | ICD-10-CM | POA: Diagnosis not present

## 2016-12-18 DIAGNOSIS — D126 Benign neoplasm of colon, unspecified: Secondary | ICD-10-CM | POA: Diagnosis not present

## 2016-12-18 DIAGNOSIS — R1013 Epigastric pain: Secondary | ICD-10-CM | POA: Diagnosis not present

## 2016-12-18 DIAGNOSIS — K219 Gastro-esophageal reflux disease without esophagitis: Secondary | ICD-10-CM | POA: Diagnosis not present

## 2016-12-22 ENCOUNTER — Encounter: Payer: Self-pay | Admitting: Vascular Surgery

## 2016-12-25 DIAGNOSIS — E042 Nontoxic multinodular goiter: Secondary | ICD-10-CM | POA: Diagnosis not present

## 2016-12-25 DIAGNOSIS — I10 Essential (primary) hypertension: Secondary | ICD-10-CM | POA: Diagnosis not present

## 2016-12-25 DIAGNOSIS — Z794 Long term (current) use of insulin: Secondary | ICD-10-CM | POA: Diagnosis not present

## 2016-12-25 DIAGNOSIS — E1165 Type 2 diabetes mellitus with hyperglycemia: Secondary | ICD-10-CM | POA: Diagnosis not present

## 2016-12-26 DIAGNOSIS — K224 Dyskinesia of esophagus: Secondary | ICD-10-CM | POA: Diagnosis not present

## 2016-12-26 DIAGNOSIS — R1310 Dysphagia, unspecified: Secondary | ICD-10-CM | POA: Diagnosis not present

## 2016-12-26 DIAGNOSIS — K219 Gastro-esophageal reflux disease without esophagitis: Secondary | ICD-10-CM | POA: Diagnosis not present

## 2016-12-29 ENCOUNTER — Ambulatory Visit: Payer: PPO | Admitting: Vascular Surgery

## 2016-12-29 ENCOUNTER — Encounter: Payer: Self-pay | Admitting: *Deleted

## 2016-12-29 ENCOUNTER — Other Ambulatory Visit: Payer: Self-pay | Admitting: *Deleted

## 2016-12-29 ENCOUNTER — Encounter: Payer: Self-pay | Admitting: Vascular Surgery

## 2016-12-29 VITALS — BP 147/76 | HR 80 | Temp 97.6°F | Resp 20 | Ht 62.5 in | Wt 201.0 lb

## 2016-12-29 DIAGNOSIS — I82511 Chronic embolism and thrombosis of right femoral vein: Secondary | ICD-10-CM

## 2016-12-29 NOTE — Progress Notes (Signed)
Patient ID: Whitney [name], female   DOB: 02-09-1945, 71 y.o.   MRN: [id number]  Reason for Consult: New Patient (Initial Visit) (DVT- U/S Estella Husk.  Ref Dr. Helene Kelp [phone].)   Referred by Ronita Hipps, MD  Subjective:     HPI:  Whitney [name] is a 71 y.o. female without history of DVT in the past.  She recently had a DVT likely in September ultimately sought treatment about October and now has evidence of more proximal obstruction from outside [place] ultrasound.  She has persistent swelling pain heaviness paresthesias in her right leg.  She denies any skin changes.  She does take her Eliquis daily as well as aspirin.  She does not have any family history of DVT.  She is up-to-date on her cancer screenings.  Past Medical History:  Diagnosis Date  . Anxiety   . Cancer ([place])    skin cancer- face-   . Complication of anesthesia   . Concussion   . Depression   . Diabetes mellitus without complication ([place])   . GERD (gastroesophageal reflux disease)   . Hypertension   . IBS (irritable bowel syndrome)    diarrhea  . MRSA (methicillin resistant Staphylococcus aureus)    after skin cancer removed from face.  . Neuropathy   . PONV (postoperative nausea and vomiting)    years ago  . PTSD (post-traumatic stress disorder)   . Stroke [facility])    "they think a silent one"   Family History  Problem Relation Age of Onset  . Heart attack Father   . Diabetes Other   . Cancer Other   . Hypertension Other    Past Surgical History:  Procedure Laterality Date  . ABDOMINAL HYSTERECTOMY    . CARPAL TUNNEL RELEASE Bilateral   . CATARACT EXTRACTION Bilateral   . CHOLECYSTECTOMY    . DILATION AND CURETTAGE OF UTERUS    . OVARIAN CYST SURGERY    . ROTATOR CUFF REPAIR Bilateral   . SALPINGOOPHORECTOMY Bilateral   . SHOULDER ARTHROSCOPY WITH SUBACROMIAL DECOMPRESSION AND OPEN ROTATOR C Right 01/31/2013   Procedure: RIGHT SHOULDER ARTHROSCOPY WITH SUBACROMIAL DECOMPRESSION AND  MINI OPEN ROTATOR CUFF REPAIR, OPEN BICEP TENDODESIS AND OPEN DISTAL CLAVICLE RESECTION;  Surgeon: Augustin Schooling, MD;  Location: [place];  Service: Orthopedics;  Laterality: Right;  . SHOULDER ARTHROSCOPY WITH SUBACROMIAL DECOMPRESSION AND OPEN ROTATOR C Left 11/28/2013   Procedure: LEFT SHOULDER ARTHROSCOPY WITH SUBACROMIAL DECOMPRESSION  BICEP TENODESIS MINI OPEN ROTATOR CUFF REPAIR, OPEN DISTAL CLAVICLE RESECTION ;  Surgeon: Augustin Schooling, MD;  Location: [facility];  Service: Orthopedics;  Laterality: Left;  . TRIGGER FINGER RELEASE Right    4   . TRIGGER FINGER RELEASE Bilateral    thumbs  . TUBAL LIGATION      Short Social History:  Social History   Tobacco Use  . Smoking status: Never Smoker  . Smokeless tobacco: Never Used  Substance Use Topics  . Alcohol use: Yes    Comment: rare    Allergies  Allergen Reactions  . Talwin [Pentazocine] Nausea Only and Other (See Comments)    Confusion  . Codeine Nausea And Vomiting and Other (See Comments)    disorientation  . Erythromycin Hives  . Morphine And Related Rash  . Percocet [Oxycodone-Acetaminophen] Itching    Current Outpatient Medications  Medication Sig Dispense Refill  . albuterol (PROVENTIL HFA;VENTOLIN HFA) 108 (90 BASE) MCG/ACT inhaler Inhale 2 puffs into the lungs every 6 (six) hours as needed  for wheezing or shortness of breath.     Marland Kitchen CALCIUM PO Take 1,000 mg by mouth 2 (two) times daily.     . celecoxib (CELEBREX) 200 MG capsule Take 200 mg by mouth daily.    . citalopram (CELEXA) 20 MG tablet Take 20 mg by mouth daily.    . diclofenac sodium (VOLTAREN) 1 % GEL Apply 2 g topically 4 (four) times daily as needed (for pain).    . insulin aspart (NOVOLOG) 100 UNIT/ML injection Inject 20 Units into the skin daily with supper.     . insulin glargine (LANTUS) 100 UNIT/ML injection Inject 120 Units into the skin at bedtime.     . methocarbamol (ROBAXIN) 500 MG tablet Take 1 tablet (500 mg total) by mouth 3 (three) times  daily as needed. (Patient taking differently: Take 1,000 mg by mouth at bedtime. ) 60 tablet 1  . pioglitazone-metformin (ACTOPLUS MET) 15-850 MG per tablet Take 2 tablets by mouth daily.     . rosuvastatin (CRESTOR) 20 MG tablet Take 20 mg by mouth daily.    Marland Kitchen acetaminophen (TYLENOL) 325 MG tablet Take 650 mg by mouth every 6 (six) hours as needed (for pain.).    Marland Kitchen aspirin EC 81 MG tablet Take 162 mg by mouth daily at 3 pm.    . Biotin w/ Vitamins C & E (HAIR/SKIN/NAILS PO) Take 1 tablet by mouth daily.    . cetirizine (ZYRTEC) 10 MG tablet Take 10 mg by mouth daily.    . Cholecalciferol (VITAMIN D3) 10000 units TABS Take 1,000 Units by mouth daily.    . clonazePAM (KLONOPIN) 1 MG tablet Take 1 mg by mouth 2 (two) times daily.    Marland Kitchen ELIQUIS 5 MG TABS tablet Take 5 mg by mouth 2 (two) times daily.    . fluconazole (DIFLUCAN) 150 MG tablet Take 150 mg by mouth daily as needed (FOR YEAST INFECTIONS.).    Marland Kitchen Hydrocortisone (CNOBSJGGE-36 EX) Apply 1 application topically 4 (four) times daily as needed (for itchy skin.).    Marland Kitchen Melatonin 10 MG TABS Take 10 mg by mouth at bedtime.    . nitroGLYCERIN (NITROSTAT) 0.4 MG SL tablet Place 0.4 mg under the tongue every 5 (five) minutes as needed for chest pain.    Marland Kitchen OIL BASE EX Apply 1 application topically 4 (four) times daily as needed (for pain.). CBD OIL    . Omega-3 Fatty Acids (OMEGA-3 FISH OIL) 1200 MG CAPS Take 1,200 mg by mouth 2 (two) times daily.    . pantoprazole (PROTONIX) 40 MG tablet Take 40 mg by mouth daily.    . ranitidine (ZANTAC) 150 MG tablet Take 150 mg by mouth daily as needed for heartburn.    . valsartan-hydrochlorothiazide (DIOVAN-HCT) 320-12.5 MG tablet Take 1 tablet by mouth daily.     No current facility-administered medications for this visit.     Review of Systems  Constitutional:  Constitutional negative. HENT: HENT negative.  Eyes: Eyes negative.  Respiratory: Positive for shortness of breath.  Cardiovascular: Positive  for leg swelling and palpitations.  GI: Gastrointestinal negative.  Musculoskeletal: Positive for leg pain.  Skin: Skin negative.  Neurological: Neurological negative. Hematologic: Hematologic/lymphatic negative.        Objective:  Objective   Vitals:   12/29/16 1318  BP: (!) 147/76  Pulse: 80  Resp: 20  Temp: 97.6 F (36.4 C)  TempSrc: Oral  SpO2: 96%  Weight: 201 lb (91.2 kg)  Height: 5' 2.5" (1.588 m)  Body mass index is 36.18 kg/m.  Physical Exam  Constitutional: She is oriented to person, place, and time.  HENT:  Head: Normocephalic.  Eyes: Pupils are equal, round, and reactive to light.  Neck: Normal range of motion.  Cardiovascular: Normal rate.  Pulses:      Dorsalis pedis pulses are 2+ on the right side, and 2+ on the left side.       Posterior tibial pulses are 2+ on the right side, and 2+ on the left side.  Abdominal: Soft. She exhibits no mass.  Musculoskeletal: She exhibits edema.  Neurological: She is alert and oriented to person, place, and time.  Skin: Skin is warm and dry.  Psychiatric: She has a normal mood and affect. Her behavior is normal. Judgment and thought content normal.    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  Decreased signal in the common femoral vein on the right suggest a more proximal IVC or iliac thrombus.     Assessment/Plan:     71 year old female with history of extensive right lower extremity DVT currently on Eliquis.  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  We will get her set up with right lower extremity venogram from a popliteal approach with possible intervention.  She is outside the window for lysis I believe and also for thrombectomy.  If she has no proximal obstructions she can likely be treated with 6 months of Eliquis only.  If there is a proximal obstruction we can hopefully treat it with stenting or other  intervention.  She will hold Eliquis the day of the procedure.  She continues aspirin.  I have recommended ambulation, and Eliquis, and     Waynetta Sandy MD [facility]

## 2017-01-02 ENCOUNTER — Encounter (HOSPITAL_COMMUNITY): Admission: RE | Payer: Self-pay | Source: Ambulatory Visit

## 2017-01-02 ENCOUNTER — Ambulatory Visit (HOSPITAL_COMMUNITY): Admission: RE | Admit: 2017-01-02 | Payer: PPO | Source: Ambulatory Visit | Admitting: Vascular Surgery

## 2017-01-02 SURGERY — LOWER EXTREMITY VENOGRAPHY
Anesthesia: LOCAL | Laterality: Right

## 2017-01-05 ENCOUNTER — Other Ambulatory Visit: Payer: Self-pay | Admitting: *Deleted

## 2017-01-09 DIAGNOSIS — S90921A Unspecified superficial injury of right foot, initial encounter: Secondary | ICD-10-CM | POA: Diagnosis not present

## 2017-01-10 ENCOUNTER — Encounter (HOSPITAL_COMMUNITY)
Admission: RE | Disposition: A | Discharge status: Home / Self Care | Payer: Self-pay | Source: Ambulatory Visit | Attending: Vascular Surgery

## 2017-01-10 ENCOUNTER — Ambulatory Visit (HOSPITAL_COMMUNITY)
Admission: RE | Admit: 2017-01-10 | Discharge: 2017-01-10 | Disposition: A | Discharge status: Home / Self Care | Payer: PPO | Source: Ambulatory Visit | Attending: Vascular Surgery | Admitting: Vascular Surgery

## 2017-01-10 DIAGNOSIS — Z7982 Long term (current) use of aspirin: Secondary | ICD-10-CM | POA: Diagnosis not present

## 2017-01-10 DIAGNOSIS — Z7901 Long term (current) use of anticoagulants: Secondary | ICD-10-CM | POA: Insufficient documentation

## 2017-01-10 DIAGNOSIS — I82511 Chronic embolism and thrombosis of right femoral vein: Secondary | ICD-10-CM | POA: Insufficient documentation

## 2017-01-10 DIAGNOSIS — Z8614 Personal history of Methicillin resistant Staphylococcus aureus infection: Secondary | ICD-10-CM | POA: Insufficient documentation

## 2017-01-10 DIAGNOSIS — I82521 Chronic embolism and thrombosis of right iliac vein: Secondary | ICD-10-CM | POA: Insufficient documentation

## 2017-01-10 DIAGNOSIS — Z880 Allergy status to penicillin: Secondary | ICD-10-CM | POA: Insufficient documentation

## 2017-01-10 DIAGNOSIS — E119 Type 2 diabetes mellitus without complications: Secondary | ICD-10-CM | POA: Insufficient documentation

## 2017-01-10 DIAGNOSIS — I82411 Acute embolism and thrombosis of right femoral vein: Secondary | ICD-10-CM | POA: Diagnosis present

## 2017-01-10 DIAGNOSIS — Z885 Allergy status to narcotic agent status: Secondary | ICD-10-CM | POA: Insufficient documentation

## 2017-01-10 DIAGNOSIS — Z794 Long term (current) use of insulin: Secondary | ICD-10-CM | POA: Diagnosis not present

## 2017-01-10 DIAGNOSIS — Z8673 Personal history of transient ischemic attack (TIA), and cerebral infarction without residual deficits: Secondary | ICD-10-CM | POA: Insufficient documentation

## 2017-01-10 DIAGNOSIS — Z79899 Other long term (current) drug therapy: Secondary | ICD-10-CM | POA: Diagnosis not present

## 2017-01-10 DIAGNOSIS — I82491 Acute embolism and thrombosis of other specified deep vein of right lower extremity: Secondary | ICD-10-CM | POA: Diagnosis not present

## 2017-01-10 HISTORY — PX: LOWER EXTREMITY VENOGRAPHY: CATH118253

## 2017-01-10 HISTORY — PX: PERIPHERAL VASCULAR BALLOON ANGIOPLASTY: CATH118281

## 2017-01-10 LAB — POCT I-STAT, CHEM 8
BUN: 12 mg/dL (ref 6–20)
CREATININE: 0.7 mg/dL (ref 0.44–1.00)
Calcium, Ion: 1.25 mmol/L (ref 1.15–1.40)
Chloride: 102 mmol/L (ref 101–111)
GLUCOSE: 151 mg/dL — AB (ref 65–99)
HEMATOCRIT: 40 % (ref 36.0–46.0)
HEMOGLOBIN: 13.6 g/dL (ref 12.0–15.0)
POTASSIUM: 3.8 mmol/L (ref 3.5–5.1)
Sodium: 141 mmol/L (ref 135–145)
TCO2: 23 mmol/L (ref 22–32)

## 2017-01-10 LAB — GLUCOSE, CAPILLARY: GLUCOSE-CAPILLARY: 104 mg/dL — AB (ref 65–99)

## 2017-01-10 SURGERY — LOWER EXTREMITY VENOGRAPHY
Anesthesia: LOCAL | Laterality: Right

## 2017-01-10 MED ORDER — MIDAZOLAM HCL 2 MG/2ML IJ SOLN
INTRAMUSCULAR | Status: AC
  Filled 2017-01-10: qty 2

## 2017-01-10 MED ORDER — FENTANYL CITRATE (PF) 100 MCG/2ML IJ SOLN
INTRAMUSCULAR | Status: AC
  Filled 2017-01-10: qty 2

## 2017-01-10 MED ORDER — SODIUM CHLORIDE 0.9% FLUSH: 3.0000 mL | INTRAVENOUS | Status: DC | PRN

## 2017-01-10 MED ORDER — SODIUM CHLORIDE 0.9 % WEIGHT BASED INFUSION: 1.0000 mL/kg/h | INTRAVENOUS | Status: DC

## 2017-01-10 MED ORDER — MORPHINE SULFATE (PF) 10 MG/ML IV SOLN: 2.0000 mg | INTRAVENOUS | Status: DC | PRN

## 2017-01-10 MED ORDER — HEPARIN SODIUM (PORCINE) 1000 UNIT/ML IJ SOLN
INTRAMUSCULAR | Status: AC
  Filled 2017-01-10: qty 1

## 2017-01-10 MED ORDER — HYDRALAZINE HCL 20 MG/ML IJ SOLN: 5.0000 mg | INTRAMUSCULAR | Status: DC | PRN

## 2017-01-10 MED ORDER — LIDOCAINE HCL (PF) 1 % IJ SOLN
INTRAMUSCULAR | Status: AC
  Filled 2017-01-10: qty 30

## 2017-01-10 MED ORDER — FENTANYL CITRATE (PF) 100 MCG/2ML IJ SOLN
INTRAMUSCULAR | Status: DC | PRN
  Administered 2017-01-10 (×2): 50 ug via INTRAVENOUS

## 2017-01-10 MED ORDER — HEPARIN (PORCINE) IN NACL 2-0.9 UNIT/ML-% IJ SOLN
INTRAMUSCULAR | Status: AC | PRN
  Administered 2017-01-10: 500 mL

## 2017-01-10 MED ORDER — LABETALOL HCL 5 MG/ML IV SOLN: 10.0000 mg | INTRAVENOUS | Status: DC | PRN

## 2017-01-10 MED ORDER — SODIUM CHLORIDE 0.9 % IV SOLN
INTRAVENOUS | Status: DC
  Administered 2017-01-10: 13:00:00 via INTRAVENOUS

## 2017-01-10 MED ORDER — SODIUM CHLORIDE 0.9% FLUSH: 3.0000 mL | Freq: Two times a day (BID) | INTRAVENOUS | Status: DC

## 2017-01-10 MED ORDER — HEPARIN SODIUM (PORCINE) 1000 UNIT/ML IJ SOLN
INTRAMUSCULAR | Status: DC | PRN
  Administered 2017-01-10: 10000 [IU] via INTRAVENOUS

## 2017-01-10 MED ORDER — SODIUM CHLORIDE 0.9 % IV SOLN: 250.0000 mL | INTRAVENOUS | Status: DC | PRN

## 2017-01-10 MED ORDER — IODIXANOL 320 MG/ML IV SOLN
INTRAVENOUS | Status: DC | PRN
  Administered 2017-01-10: 20 mL via INTRAVENOUS

## 2017-01-10 MED ORDER — LIDOCAINE HCL (PF) 1 % IJ SOLN
INTRAMUSCULAR | Status: DC | PRN
Start: 2017-01-10 — End: 2017-01-10
  Administered 2017-01-10: 10 mL via INTRADERMAL

## 2017-01-10 MED ORDER — MIDAZOLAM HCL 2 MG/2ML IJ SOLN
INTRAMUSCULAR | Status: DC | PRN
  Administered 2017-01-10 (×2): 1 mg via INTRAVENOUS

## 2017-01-10 SURGICAL SUPPLY — 17 items
BALLN MUSTANG 10X80X75 (BALLOONS) ×3
BALLN MUSTANG 12X80X75 (BALLOONS) ×3
BALLOON MUSTANG 10X80X75 (BALLOONS) ×2 IMPLANT
BALLOON MUSTANG 12X80X75 (BALLOONS) ×2 IMPLANT
CATH ANGIO 5F BER2 65CM (CATHETERS) ×3 IMPLANT
CATH VISIONS PV .035 IVUS (CATHETERS) ×3 IMPLANT
DEVICE TORQUE .025-.038 (MISCELLANEOUS) ×3 IMPLANT
GUIDEWIRE ANGLED .035X260CM (WIRE) ×3 IMPLANT
KIT MICROINTRODUCER STIFF 5F (SHEATH) ×3 IMPLANT
KIT PV (KITS) ×3 IMPLANT
SHEATH PINNACLE 5F 10CM (SHEATH) ×3 IMPLANT
SHEATH PINNACLE 8F 10CM (SHEATH) ×3 IMPLANT
SYR MEDRAD MARK V 150ML (SYRINGE) ×3 IMPLANT
TRANSDUCER W/STOPCOCK (MISCELLANEOUS) ×3 IMPLANT
TRAY PV CATH (CUSTOM PROCEDURE TRAY) ×3 IMPLANT
WIRE AMPLATZ SS-J .035X260CM (WIRE) ×3 IMPLANT
WIRE BENTSON .035X145CM (WIRE) ×3 IMPLANT

## 2017-01-10 NOTE — Op Note (Signed)
Patient name: Whitney [name] MRN: [id number] DOB: 18-May-1945 Sex: female  01/10/2017 Pre-operative Diagnosis: extensive right lower extremity dvt Post-operative diagnosis:  Same Surgeon:  Eda Paschal. Donzetta Matters, MD Procedure Performed: 1.  US guided cannulation of right small saphenous vein 2.  Right lower extremity venogram 3.  IVUS of right popliteal, femoral, common femoral, external iliac, common iliac veins and IVC 4.  Balloon angioplasty of right femoral, common femoral and external iliac veins with 10 and 61mm balloons 5.  Moderate sedation with fentanyl and versed for 45 minutes   Indications: 71 year old female has a recent history of extensive right lower extremity DVT with post thrombotic syndrome.  She is on Eliquis and aspirin.  She is now indicated for venogram of her right lower extremity with possible intervention.  Findings: Popliteal vein was noted to be occluded on ultrasound evaluation.  Small saphenous vein was dilated and minimally compressible and this was cannulated.  Venogram demonstrated occlusion of the femoral vein with reconstitution of the common femoral vein and via collateralization and possibly a common femoral stenosis as well.  I this demonstrated this common femoral stenosis as well as the occluded segment in the femoral vein.  Following balloon angioplasty there was improved flow although still collateralization in the right lower extremity.  There was a 10 x 11 mm femoral vein in the cephalad aspect proximally 40% patent but improved from 0% patency.  There is 30% stenosis of the most cephalad aspect of the common femoral vein which was not stented.  She will remain on Eliquis for a period of at least 6 months and aspirin lifelong.  She has been instructed to ambulate and wear compression.   Procedure:  The patient was identified in the holding area and taken to room 8.  The patient was then placed prone on the operating table sterilely prepped and draped  in her bilateral popliteal fossae.  She was given moderate sedation with fentanyl and Versed.  We then used ultrasound to evaluate the popliteal vein was noted to be occluded small saphenous vein was then cannulated with micropuncture needle and a micropuncture sheath was placed.  She was given 1% lidocaine prior to this in the area.  We then used a Glidewire to get through an occluded segment and placed a 5 French sheath and performed right lower extremity venogram.  With the above findings the patient was given 10,000 units of heparin.  We then used a Glidewire and bare catheter to navigate into the IVC and confirmed we were there fluoroscopically.  An Amplatz wire was placed into the cephalad aspect of the IVC.  We then performed from the popliteal all the way through the IVC with the above findings.  We then performed balloon angioplasty initially with a 10 mm balloon from the femoral all the way to the external iliac veins.  We then reperformed I this and right lower extremity venogram and then balloon angioplasty of the femoral common femoral and external iliac veins with a 12 mm balloon.  Venogram now demonstrated improved flow although there was still some collateralization in the thigh.  IVUS demonstrated the above findings of 40% patent femoral vein.  There is also 30% stenosis of the cephalad aspect of the common femoral vein.  Her flow was significantly improved and we elected to its wire through the this catheter.  Sheath was then pulled pressure held for 10 minutes until hemostasis was obtained.  Patient tolerated procedure well without immediate comp occasion  Contrast:  [place] Donzetta Matters, MD [facility] Office: [phone] Pager: [phone]

## 2017-01-10 NOTE — H&P (Signed)
   History and Physical Update  The patient was interviewed and re-examined.  The patient's previous History and Physical has been reviewed and is unchanged from previous. Plan for venogram of rle from prone positioning.   Shakhia Gramajo C. Donzetta Matters, MD [facility] Office: [phone] Pager: [phone]   01/10/2017, 11:43 AM

## 2017-01-10 NOTE — Discharge Instructions (Signed)
° ° °  Venogram, Care After This sheet gives you information about how to care for yourself after your procedure. Your health care provider may also give you more specific instructions. If you have problems or questions, contact your health care provider. What can I expect after the procedure? After the procedure, it is common to have:  Bruising or mild discomfort in the area where the IV was inserted (insertion site).  Follow these instructions at home: Eating and drinking  Follow instructions from your health care provider about eating or drinking restrictions.  Drink a lot of fluids for the first several days after the procedure, as directed by your health care provider. This helps to wash (flush) the contrast out of your body. Examples of healthy fluids include water or low-calorie drinks. General instructions  Check your IV insertion area every day for signs of infection. Check for: ? Redness, swelling, or pain. ? Fluid or blood. ? Warmth. ? Pus or a bad smell.  Take over-the-counter and prescription medicines only as told by your health care provider.  Rest and return to your normal activities as told by your health care provider. Ask your health care provider what activities are safe for you.  Avoid crossing legs for 2-3 days.  Do not drive for 24 hours if you were given a medicine to help you relax (sedative), or until your health care provider approves.  Keep all follow-up visits as told by your health care provider. This is important.  If site starts to bleed, hold pressure for 20 minutes. If it does not stop after 20 minutes get help right away. Contact a health care provider if:  Your skin becomes itchy or you develop a rash or hives.  You have a fever that does not get better with medicine.  You feel nauseous.  You vomit.  You have redness, swelling, or pain around the insertion site.  You have fluid or blood coming from the insertion site.  Your insertion  area feels warm to the touch.  You have pus or a bad smell coming from the insertion site. Get help right away if:  You have difficulty breathing or shortness of breath.  You develop chest pain.  You faint.  You feel very dizzy. These symptoms may represent a serious problem that is an emergency. Do not wait to see if the symptoms will go away. Get medical help right away. Call your local emergency services (911 in the [place] Do not drive yourself to the hospital. Summary  After your procedure, it is common to have bruising or mild discomfort in the area where the IV was inserted.  You should check your IV insertion area every day for signs of infection.  Take over-the-counter and prescription medicines only as told by your health care provider.  You should drink a lot of fluids for the first several days after the procedure to help flush the contrast from your body. This information is not intended to replace advice given to you by your health care provider. Make sure you discuss any questions you have with your health care provider. Document Released: 10/30/2012 Document Revised: 12/04/2015 Document Reviewed: 12/04/2015 Elsevier Interactive Patient Education  2017 [employer].

## 2017-01-11 ENCOUNTER — Encounter (HOSPITAL_COMMUNITY): Payer: Self-pay | Admitting: Vascular Surgery

## 2017-01-11 ENCOUNTER — Telehealth: Payer: Self-pay | Admitting: Vascular Surgery

## 2017-01-11 NOTE — Telephone Encounter (Signed)
-----   Message from Mena Goes, RN sent at 01/11/2017 11:25 AM EST ----- Regarding: 4-6 weeks w/ lab   ----- Message ----- From: Waynetta Sandy, MD Sent: 01/10/2017   2:44 PM To: [facility] [address]  Arrow Emmerich [id number] 12-19-1945  01/10/2017 Pre-operative Diagnosis: extensive right lower extremity dvt  Surgeon:  Eda Paschal. Donzetta Matters, MD  Procedure Performed: 1.  US guided cannulation of right small saphenous vein 2.  Right lower extremity venogram 3.  IVUS of right popliteal, femoral, common femoral, external iliac, common iliac veins and IVC 4.  Balloon angioplasty of right femoral, common femoral and external iliac veins with 10 and 61mm balloons 5.  Moderate sedation with fentanyl and versed for 45 minutes  F/u in 4-6 weeks with right lower extremity venous duplex

## 2017-01-11 NOTE — Telephone Encounter (Signed)
Sched appt 02/23/17; lab at 8:00 and MD at 8:30. Spoke to pt's husband.

## 2017-01-12 ENCOUNTER — Other Ambulatory Visit: Payer: Self-pay

## 2017-01-12 ENCOUNTER — Encounter: Payer: Self-pay | Admitting: Vascular Surgery

## 2017-01-12 DIAGNOSIS — I824Y9 Acute embolism and thrombosis of unspecified deep veins of unspecified proximal lower extremity: Secondary | ICD-10-CM

## 2017-01-12 DIAGNOSIS — Z48812 Encounter for surgical aftercare following surgery on the circulatory system: Secondary | ICD-10-CM

## 2017-02-05 DIAGNOSIS — K219 Gastro-esophageal reflux disease without esophagitis: Secondary | ICD-10-CM | POA: Diagnosis not present

## 2017-02-05 DIAGNOSIS — K222 Esophageal obstruction: Secondary | ICD-10-CM | POA: Diagnosis not present

## 2017-02-05 DIAGNOSIS — K591 Functional diarrhea: Secondary | ICD-10-CM | POA: Diagnosis not present

## 2017-02-23 ENCOUNTER — Ambulatory Visit (HOSPITAL_COMMUNITY)
Admission: RE | Admit: 2017-02-23 | Discharge: 2017-02-23 | Disposition: A | Discharge status: Home / Self Care | Payer: PPO | Source: Ambulatory Visit | Attending: Vascular Surgery | Admitting: Vascular Surgery

## 2017-02-23 ENCOUNTER — Ambulatory Visit (INDEPENDENT_AMBULATORY_CARE_PROVIDER_SITE_OTHER): Payer: PPO | Admitting: Vascular Surgery

## 2017-02-23 ENCOUNTER — Encounter: Payer: Self-pay | Admitting: Vascular Surgery

## 2017-02-23 VITALS — BP 159/79 | HR 68 | Resp 18 | Ht 63.0 in | Wt 195.7 lb

## 2017-02-23 DIAGNOSIS — I824Y9 Acute embolism and thrombosis of unspecified deep veins of unspecified proximal lower extremity: Secondary | ICD-10-CM | POA: Diagnosis not present

## 2017-02-23 DIAGNOSIS — I82511 Chronic embolism and thrombosis of right femoral vein: Secondary | ICD-10-CM

## 2017-02-23 DIAGNOSIS — Z86718 Personal history of other venous thrombosis and embolism: Secondary | ICD-10-CM | POA: Insufficient documentation

## 2017-02-23 DIAGNOSIS — Z48812 Encounter for surgical aftercare following surgery on the circulatory system: Secondary | ICD-10-CM | POA: Diagnosis not present

## 2017-02-23 NOTE — Progress Notes (Signed)
Patient ID: Whitney [name], female   DOB: 1945/09/17, 72 y.o.   MRN: [id number]  Reason for Consult: Follow-up (4-6 wk f/u s/p R LE venogram)   Referred by Ronita Hipps, MD  Subjective:     HPI:  Whitney [name] is a 72 y.o. female with a history of DVT that occurred around Labor Day of this past year and was found to have obstruction in her right femoral vein.  She recently underwent venogram balloon angioplasty of her right lower extremity veins.  She is maintained on Eliquis but is not taking aspirin at this time.  She is very compliant with her compression stockings.  At this time she does have moderate pain with mild cramping and swelling.  She has no paresthesias or itching to her leg.  There are no external evidence is of abnormalities.  She does not have any other personal family history of DVT.  She does not have any varicosities.  She does walk although she feels somewhat slowed by her right lower extremity.  Past Medical History:  Diagnosis Date  . Anxiety   . Cancer ([place])    skin cancer- face-   . Complication of anesthesia   . Concussion   . Depression   . Diabetes mellitus without complication ([place])   . GERD (gastroesophageal reflux disease)   . Hypertension   . IBS (irritable bowel syndrome)    diarrhea  . MRSA (methicillin resistant Staphylococcus aureus)    after skin cancer removed from face.  . Neuropathy   . PONV (postoperative nausea and vomiting)    years ago  . PTSD (post-traumatic stress disorder)   . Stroke [facility])    "they think a silent one"   Family History  Problem Relation Age of Onset  . Heart attack Father   . Diabetes Other   . Cancer Other   . Hypertension Other    Past Surgical History:  Procedure Laterality Date  . ABDOMINAL HYSTERECTOMY    . CARPAL TUNNEL RELEASE Bilateral   . CATARACT EXTRACTION Bilateral   . CHOLECYSTECTOMY    . DILATION AND CURETTAGE OF UTERUS    . LOWER EXTREMITY VENOGRAPHY N/A 01/10/2017   Procedure: LOWER EXTREMITY ASCENDING VENOGRAPHY;  Surgeon: Waynetta Sandy, MD;  Location: [place] [facility];  Service: Cardiovascular;  Laterality: N/A;  . OVARIAN CYST SURGERY    . PERIPHERAL VASCULAR BALLOON ANGIOPLASTY Right 01/10/2017   Procedure: PERIPHERAL VASCULAR BALLOON ANGIOPLASTY;  Surgeon: Waynetta Sandy, MD;  Location: [place] [facility];  Service: Cardiovascular;  Laterality: Right;  . ROTATOR CUFF REPAIR Bilateral   . SALPINGOOPHORECTOMY Bilateral   . SHOULDER ARTHROSCOPY WITH SUBACROMIAL DECOMPRESSION AND OPEN ROTATOR C Right 01/31/2013   Procedure: RIGHT SHOULDER ARTHROSCOPY WITH SUBACROMIAL DECOMPRESSION AND MINI OPEN ROTATOR CUFF REPAIR, OPEN BICEP TENDODESIS AND OPEN DISTAL CLAVICLE RESECTION;  Surgeon: Augustin Schooling, MD;  Location: [place];  Service: Orthopedics;  Laterality: Right;  . SHOULDER ARTHROSCOPY WITH SUBACROMIAL DECOMPRESSION AND OPEN ROTATOR C Left 11/28/2013   Procedure: LEFT SHOULDER ARTHROSCOPY WITH SUBACROMIAL DECOMPRESSION  BICEP TENODESIS MINI OPEN ROTATOR CUFF REPAIR, OPEN DISTAL CLAVICLE RESECTION ;  Surgeon: Augustin Schooling, MD;  Location: [place];  Service: Orthopedics;  Laterality: Left;  . TRIGGER FINGER RELEASE Right    4   . TRIGGER FINGER RELEASE Bilateral    thumbs  . TUBAL LIGATION      Short Social History:  Social History   Tobacco Use  . Smoking status:  Never Smoker  . Smokeless tobacco: Never Used  Substance Use Topics  . Alcohol use: Yes    Comment: rare    Allergies  Allergen Reactions  . Talwin [Pentazocine] Nausea Only and Other (See Comments)    Confusion  . Codeine Nausea And Vomiting and Other (See Comments)    disorientation  . Erythromycin Hives  . Morphine And Related Rash  . Percocet [Oxycodone-Acetaminophen] Itching    Current Outpatient Medications  Medication Sig Dispense Refill  . acetaminophen (TYLENOL) 325 MG tablet Take 650 mg by mouth every 6 (six) hours as needed (for pain.).    Marland Kitchen  albuterol (PROVENTIL HFA;VENTOLIN HFA) 108 (90 BASE) MCG/ACT inhaler Inhale 2 puffs into the lungs every 6 (six) hours as needed for wheezing or shortness of breath.     Marland Kitchen aspirin EC 81 MG tablet Take 162 mg by mouth daily at 3 pm.    . Biotin w/ Vitamins C & E (HAIR/SKIN/NAILS PO) Take 1 tablet by mouth daily.    Marland Kitchen CALCIUM PO Take 1,000 mg by mouth 2 (two) times daily.     . celecoxib (CELEBREX) 200 MG capsule Take 200 mg by mouth daily.    . cetirizine (ZYRTEC) 10 MG tablet Take 10 mg by mouth daily.    . Cholecalciferol (VITAMIN D3) 10000 units TABS Take 1,000 Units by mouth daily.    . citalopram (CELEXA) 20 MG tablet Take 20 mg by mouth daily.    . clonazePAM (KLONOPIN) 1 MG tablet Take 1 mg by mouth 2 (two) times daily.    . diclofenac sodium (VOLTAREN) 1 % GEL Apply 2 g topically 4 (four) times daily as needed (for pain).    Marland Kitchen ELIQUIS 5 MG TABS tablet Take 5 mg by mouth 2 (two) times daily.    . fluconazole (DIFLUCAN) 150 MG tablet Take 150 mg by mouth daily as needed (FOR YEAST INFECTIONS.).    Marland Kitchen Hydrocortisone (ESPQZRAQT-62 EX) Apply 1 application topically 4 (four) times daily as needed (for itchy skin.).    Marland Kitchen insulin aspart (NOVOLOG) 100 UNIT/ML injection Inject 20 Units into the skin daily with supper.     . insulin glargine (LANTUS) 100 UNIT/ML injection Inject 120 Units into the skin at bedtime.     . Melatonin 10 MG TABS Take 10 mg by mouth at bedtime.    . methocarbamol (ROBAXIN) 500 MG tablet Take 1 tablet (500 mg total) by mouth 3 (three) times daily as needed. (Patient taking differently: Take 1,000 mg by mouth at bedtime. ) 60 tablet 1  . nitroGLYCERIN (NITROSTAT) 0.4 MG SL tablet Place 0.4 mg under the tongue every 5 (five) minutes as needed for chest pain.    Marland Kitchen OIL BASE EX Apply 1 application topically 4 (four) times daily as needed (for pain.). CBD OIL    . Omega-3 Fatty Acids (OMEGA-3 FISH OIL) 1200 MG CAPS Take 1,200 mg by mouth 2 (two) times daily.    . pantoprazole  (PROTONIX) 40 MG tablet Take 40 mg by mouth daily.    . pioglitazone-metformin (ACTOPLUS MET) 15-850 MG per tablet Take 2 tablets by mouth daily.     . ranitidine (ZANTAC) 150 MG tablet Take 150 mg by mouth daily as needed for heartburn.    . rosuvastatin (CRESTOR) 20 MG tablet Take 20 mg by mouth daily.    . valsartan-hydrochlorothiazide (DIOVAN-HCT) 320-12.5 MG tablet Take 1 tablet by mouth daily.     No current facility-administered medications for this visit.  Review of Systems  Constitutional:  Constitutional negative. HENT: HENT negative.  Eyes: Eyes negative.  Respiratory: Respiratory negative.  Cardiovascular: Positive for leg swelling.  GI: Gastrointestinal negative.  Musculoskeletal: Positive for leg pain.  Skin: Skin negative.  Neurological: Neurological negative. Hematologic: Hematologic/lymphatic negative.  Psychiatric: Psychiatric negative.        Objective:  Objective   Vitals:   02/23/17 0852 02/23/17 0856  BP: (!) 172/88 (!) 159/79  Pulse: 68   Resp: 18   SpO2: 98%   Weight: 195 lb 11.2 oz (88.8 kg)   Height: _0  (1.6 m)    Body mass index is 34.67 kg/m.  Physical Exam  Constitutional: She is oriented to person, place, and time. She appears well-developed.  HENT:  Head: Normocephalic.  Eyes: Pupils are equal, round, and reactive to light.  Neck: Normal range of motion.  Cardiovascular: Normal rate.  Pulses:      Dorsalis pedis pulses are 2+ on the right side, and 2+ on the left side.       Posterior tibial pulses are 2+ on the right side, and 2+ on the left side.  Pulmonary/Chest: Effort normal.  Abdominal: Soft.  Musculoskeletal: She exhibits no deformity.  Right leg 41cm diameter measured 10cm distal to tuberosity, left leg in compression  Neurological: She is alert and oriented to person, place, and time.  Skin: Skin is warm and dry.  Psychiatric: She has a normal mood and affect. Her behavior is normal. Judgment and thought content  normal.    Villalta score 6  Data: I have independently interpreted her right lower extremity duplex which demonstrates partial compressibility from the common femoral vein all the way down in a partially recanalized femoral vein.     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  She is undergone intervention and now has partial recanalization of the femoral vein improved symptoms.  She is very compliant with her compression stockings currently on Eliquis and I have asked her to start taking aspirin.  I have also encouraged her to continue walking.  We will continue Eliquis out to at least another 5 months at which time she will follow-up with duplex.  Aspirin will be lifelong as well as compression stockings and walking program.  She demonstrates good understanding we will get her scheduled today.  Should she have issues between now and then we can certainly see her sooner.     Waynetta Sandy MD [facility]

## 2017-02-26 ENCOUNTER — Other Ambulatory Visit: Payer: Self-pay

## 2017-02-26 DIAGNOSIS — I824Z1 Acute embolism and thrombosis of unspecified deep veins of right distal lower extremity: Secondary | ICD-10-CM

## 2017-03-28 DIAGNOSIS — E1165 Type 2 diabetes mellitus with hyperglycemia: Secondary | ICD-10-CM | POA: Diagnosis not present

## 2017-03-28 DIAGNOSIS — Z794 Long term (current) use of insulin: Secondary | ICD-10-CM | POA: Diagnosis not present

## 2017-03-28 DIAGNOSIS — I10 Essential (primary) hypertension: Secondary | ICD-10-CM | POA: Diagnosis not present

## 2017-03-28 DIAGNOSIS — E042 Nontoxic multinodular goiter: Secondary | ICD-10-CM | POA: Diagnosis not present

## 2017-04-20 DIAGNOSIS — E041 Nontoxic single thyroid nodule: Secondary | ICD-10-CM | POA: Diagnosis not present

## 2017-04-20 DIAGNOSIS — E042 Nontoxic multinodular goiter: Secondary | ICD-10-CM | POA: Diagnosis not present

## 2017-05-04 DIAGNOSIS — E785 Hyperlipidemia, unspecified: Secondary | ICD-10-CM | POA: Diagnosis not present

## 2017-05-04 DIAGNOSIS — Z6834 Body mass index (BMI) 34.0-34.9, adult: Secondary | ICD-10-CM | POA: Diagnosis not present

## 2017-05-04 DIAGNOSIS — M545 Low back pain: Secondary | ICD-10-CM | POA: Diagnosis not present

## 2017-05-04 DIAGNOSIS — M159 Polyosteoarthritis, unspecified: Secondary | ICD-10-CM | POA: Diagnosis not present

## 2017-05-04 DIAGNOSIS — E1165 Type 2 diabetes mellitus with hyperglycemia: Secondary | ICD-10-CM | POA: Diagnosis not present

## 2017-05-04 DIAGNOSIS — I10 Essential (primary) hypertension: Secondary | ICD-10-CM | POA: Diagnosis not present

## 2017-05-04 DIAGNOSIS — Z79899 Other long term (current) drug therapy: Secondary | ICD-10-CM | POA: Diagnosis not present

## 2017-05-04 DIAGNOSIS — F329 Major depressive disorder, single episode, unspecified: Secondary | ICD-10-CM | POA: Diagnosis not present

## 2017-05-04 DIAGNOSIS — Z0000 Encounter for general adult medical examination without abnormal findings: Secondary | ICD-10-CM | POA: Diagnosis not present

## 2017-05-04 DIAGNOSIS — I82409 Acute embolism and thrombosis of unspecified deep veins of unspecified lower extremity: Secondary | ICD-10-CM | POA: Diagnosis not present

## 2017-05-07 ENCOUNTER — Other Ambulatory Visit: Payer: Self-pay | Admitting: Family Medicine

## 2017-05-07 DIAGNOSIS — M545 Low back pain: Secondary | ICD-10-CM

## 2017-05-15 ENCOUNTER — Ambulatory Visit
Admission: RE | Admit: 2017-05-15 | Discharge: 2017-05-15 | Disposition: A | Discharge status: Home / Self Care | Payer: PPO | Source: Ambulatory Visit | Attending: Family Medicine | Admitting: Family Medicine

## 2017-05-15 DIAGNOSIS — M5126 Other intervertebral disc displacement, lumbar region: Secondary | ICD-10-CM | POA: Diagnosis not present

## 2017-05-15 DIAGNOSIS — M545 Low back pain: Secondary | ICD-10-CM

## 2017-05-15 DIAGNOSIS — M48061 Spinal stenosis, lumbar region without neurogenic claudication: Secondary | ICD-10-CM | POA: Diagnosis not present

## 2017-05-15 MED ORDER — GADOBENATE DIMEGLUMINE 529 MG/ML IV SOLN
18.0000 mL | Freq: Once | INTRAVENOUS | Status: AC | PRN
  Administered 2017-05-15: 18 mL via INTRAVENOUS

## 2017-05-29 DIAGNOSIS — M5136 Other intervertebral disc degeneration, lumbar region: Secondary | ICD-10-CM | POA: Diagnosis not present

## 2017-05-29 DIAGNOSIS — M47817 Spondylosis without myelopathy or radiculopathy, lumbosacral region: Secondary | ICD-10-CM | POA: Diagnosis not present

## 2017-05-29 DIAGNOSIS — M47816 Spondylosis without myelopathy or radiculopathy, lumbar region: Secondary | ICD-10-CM | POA: Diagnosis not present

## 2017-05-29 DIAGNOSIS — M4316 Spondylolisthesis, lumbar region: Secondary | ICD-10-CM | POA: Diagnosis not present

## 2017-06-05 DIAGNOSIS — M545 Low back pain: Secondary | ICD-10-CM | POA: Diagnosis not present

## 2017-06-05 DIAGNOSIS — M79605 Pain in left leg: Secondary | ICD-10-CM | POA: Diagnosis not present

## 2017-06-05 DIAGNOSIS — M79604 Pain in right leg: Secondary | ICD-10-CM | POA: Diagnosis not present

## 2017-06-05 DIAGNOSIS — R2689 Other abnormalities of gait and mobility: Secondary | ICD-10-CM | POA: Diagnosis not present

## 2017-06-05 DIAGNOSIS — M4316 Spondylolisthesis, lumbar region: Secondary | ICD-10-CM | POA: Diagnosis not present

## 2017-06-15 DIAGNOSIS — J069 Acute upper respiratory infection, unspecified: Secondary | ICD-10-CM | POA: Diagnosis not present

## 2017-06-15 DIAGNOSIS — Z6833 Body mass index (BMI) 33.0-33.9, adult: Secondary | ICD-10-CM | POA: Diagnosis not present

## 2017-06-28 DIAGNOSIS — I10 Essential (primary) hypertension: Secondary | ICD-10-CM | POA: Diagnosis not present

## 2017-06-28 DIAGNOSIS — Z794 Long term (current) use of insulin: Secondary | ICD-10-CM | POA: Diagnosis not present

## 2017-06-28 DIAGNOSIS — E042 Nontoxic multinodular goiter: Secondary | ICD-10-CM | POA: Diagnosis not present

## 2017-06-28 DIAGNOSIS — E1165 Type 2 diabetes mellitus with hyperglycemia: Secondary | ICD-10-CM | POA: Diagnosis not present

## 2017-06-29 DIAGNOSIS — M4316 Spondylolisthesis, lumbar region: Secondary | ICD-10-CM | POA: Diagnosis not present

## 2017-06-29 DIAGNOSIS — M545 Low back pain: Secondary | ICD-10-CM | POA: Diagnosis not present

## 2017-06-29 DIAGNOSIS — R2689 Other abnormalities of gait and mobility: Secondary | ICD-10-CM | POA: Diagnosis not present

## 2017-06-29 DIAGNOSIS — M79604 Pain in right leg: Secondary | ICD-10-CM | POA: Diagnosis not present

## 2017-06-29 DIAGNOSIS — M79605 Pain in left leg: Secondary | ICD-10-CM | POA: Diagnosis not present

## 2017-08-03 ENCOUNTER — Ambulatory Visit: Payer: PPO | Admitting: Vascular Surgery

## 2017-08-03 ENCOUNTER — Encounter (HOSPITAL_COMMUNITY): Payer: PPO

## 2017-08-03 DIAGNOSIS — M79605 Pain in left leg: Secondary | ICD-10-CM | POA: Diagnosis not present

## 2017-08-03 DIAGNOSIS — M79604 Pain in right leg: Secondary | ICD-10-CM | POA: Diagnosis not present

## 2017-08-03 DIAGNOSIS — R2689 Other abnormalities of gait and mobility: Secondary | ICD-10-CM | POA: Diagnosis not present

## 2017-08-03 DIAGNOSIS — M4316 Spondylolisthesis, lumbar region: Secondary | ICD-10-CM | POA: Diagnosis not present

## 2017-08-03 DIAGNOSIS — M545 Low back pain: Secondary | ICD-10-CM | POA: Diagnosis not present

## 2017-08-09 DIAGNOSIS — T39395A Adverse effect of other nonsteroidal anti-inflammatory drugs [NSAID], initial encounter: Secondary | ICD-10-CM | POA: Diagnosis not present

## 2017-08-09 DIAGNOSIS — Z794 Long term (current) use of insulin: Secondary | ICD-10-CM | POA: Diagnosis not present

## 2017-08-09 DIAGNOSIS — N183 Chronic kidney disease, stage 3 (moderate): Secondary | ICD-10-CM | POA: Diagnosis not present

## 2017-08-09 DIAGNOSIS — E1165 Type 2 diabetes mellitus with hyperglycemia: Secondary | ICD-10-CM | POA: Diagnosis not present

## 2017-08-09 DIAGNOSIS — N179 Acute kidney failure, unspecified: Secondary | ICD-10-CM | POA: Diagnosis not present

## 2017-08-09 DIAGNOSIS — R809 Proteinuria, unspecified: Secondary | ICD-10-CM | POA: Diagnosis not present

## 2017-08-09 DIAGNOSIS — N141 Nephropathy induced by other drugs, medicaments and biological substances: Secondary | ICD-10-CM | POA: Diagnosis not present

## 2017-08-22 DIAGNOSIS — E785 Hyperlipidemia, unspecified: Secondary | ICD-10-CM | POA: Diagnosis not present

## 2017-08-22 DIAGNOSIS — E1165 Type 2 diabetes mellitus with hyperglycemia: Secondary | ICD-10-CM | POA: Diagnosis not present

## 2017-08-31 ENCOUNTER — Other Ambulatory Visit: Payer: Self-pay

## 2017-08-31 ENCOUNTER — Encounter: Payer: Self-pay | Admitting: Vascular Surgery

## 2017-08-31 ENCOUNTER — Ambulatory Visit (INDEPENDENT_AMBULATORY_CARE_PROVIDER_SITE_OTHER): Payer: PPO | Admitting: Vascular Surgery

## 2017-08-31 ENCOUNTER — Ambulatory Visit (HOSPITAL_COMMUNITY)
Admission: RE | Admit: 2017-08-31 | Discharge: 2017-08-31 | Disposition: A | Discharge status: Home / Self Care | Payer: PPO | Source: Ambulatory Visit | Attending: Vascular Surgery | Admitting: Vascular Surgery

## 2017-08-31 VITALS — BP 129/77 | HR 58 | Temp 98.2°F | Resp 20 | Ht 63.0 in | Wt 209.0 lb

## 2017-08-31 DIAGNOSIS — I82511 Chronic embolism and thrombosis of right femoral vein: Secondary | ICD-10-CM | POA: Diagnosis not present

## 2017-08-31 DIAGNOSIS — I824Z1 Acute embolism and thrombosis of unspecified deep veins of right distal lower extremity: Secondary | ICD-10-CM

## 2017-08-31 NOTE — Progress Notes (Signed)
Patient ID: Whitney [name], female   DOB: 12-07-1945, 72 y.o.   MRN: [id number]  Reason for Consult: New Patient (Initial Visit) (5 mth f/u.  LE DVT R. )   Referred by Ronita Hipps, MD  Subjective:     HPI:  Whitney [name] is a 72 y.o. female with history of previous right lower extremity DVT.  She underwent revascularization.  She remains on Eliquis and aspirin 81 mg.  She has had no further DVTs.  She has had a tough year with the death in her family is wearing compression stockings overall doing well peer  Past Medical History:  Diagnosis Date  . Anxiety   . Cancer ([place])    skin cancer- face-   . Complication of anesthesia   . Concussion   . Depression   . Diabetes mellitus without complication ([place])   . GERD (gastroesophageal reflux disease)   . Hypertension   . IBS (irritable bowel syndrome)    diarrhea  . MRSA (methicillin resistant Staphylococcus aureus)    after skin cancer removed from face.  . Neuropathy   . PONV (postoperative nausea and vomiting)    years ago  . PTSD (post-traumatic stress disorder)   . Stroke [facility])    "they think a silent one"   Family History  Problem Relation Age of Onset  . Heart attack Father   . Diabetes Other   . Cancer Other   . Hypertension Other    Past Surgical History:  Procedure Laterality Date  . ABDOMINAL HYSTERECTOMY    . CARPAL TUNNEL RELEASE Bilateral   . CATARACT EXTRACTION Bilateral   . CHOLECYSTECTOMY    . DILATION AND CURETTAGE OF UTERUS    . LOWER EXTREMITY VENOGRAPHY N/A 01/10/2017   Procedure: LOWER EXTREMITY ASCENDING VENOGRAPHY;  Surgeon: Waynetta Sandy, MD;  Location: [place] [facility];  Service: Cardiovascular;  Laterality: N/A;  . OVARIAN CYST SURGERY    . PERIPHERAL VASCULAR BALLOON ANGIOPLASTY Right 01/10/2017   Procedure: PERIPHERAL VASCULAR BALLOON ANGIOPLASTY;  Surgeon: Waynetta Sandy, MD;  Location: [place] [facility];  Service: Cardiovascular;  Laterality: Right;    . ROTATOR CUFF REPAIR Bilateral   . SALPINGOOPHORECTOMY Bilateral   . SHOULDER ARTHROSCOPY WITH SUBACROMIAL DECOMPRESSION AND OPEN ROTATOR C Right 01/31/2013   Procedure: RIGHT SHOULDER ARTHROSCOPY WITH SUBACROMIAL DECOMPRESSION AND MINI OPEN ROTATOR CUFF REPAIR, OPEN BICEP TENDODESIS AND OPEN DISTAL CLAVICLE RESECTION;  Surgeon: Augustin Schooling, MD;  Location: [place];  Service: Orthopedics;  Laterality: Right;  . SHOULDER ARTHROSCOPY WITH SUBACROMIAL DECOMPRESSION AND OPEN ROTATOR C Left 11/28/2013   Procedure: LEFT SHOULDER ARTHROSCOPY WITH SUBACROMIAL DECOMPRESSION  BICEP TENODESIS MINI OPEN ROTATOR CUFF REPAIR, OPEN DISTAL CLAVICLE RESECTION ;  Surgeon: Augustin Schooling, MD;  Location: [place];  Service: Orthopedics;  Laterality: Left;  . TRIGGER FINGER RELEASE Right    4   . TRIGGER FINGER RELEASE Bilateral    thumbs  . TUBAL LIGATION      Short Social History:  Social History   Tobacco Use  . Smoking status: Never Smoker  . Smokeless tobacco: Never Used  Substance Use Topics  . Alcohol use: Yes    Comment: rare    Allergies  Allergen Reactions  . Talwin [Pentazocine] Nausea Only and Other (See Comments)    Confusion  . Codeine Nausea And Vomiting and Other (See Comments)    disorientation  . Erythromycin Hives  . Morphine And Related Rash  . Percocet [Oxycodone-Acetaminophen] Itching  Current Outpatient Medications  Medication Sig Dispense Refill  . acetaminophen (TYLENOL) 325 MG tablet Take 650 mg by mouth every 6 (six) hours as needed (for pain.).    Marland Kitchen albuterol (PROVENTIL HFA;VENTOLIN HFA) 108 (90 BASE) MCG/ACT inhaler Inhale 2 puffs into the lungs every 6 (six) hours as needed for wheezing or shortness of breath.     Marland Kitchen aspirin EC 81 MG tablet Take 162 mg by mouth daily at 3 pm.    . Biotin w/ Vitamins C & E (HAIR/SKIN/NAILS PO) Take 1 tablet by mouth daily.    Marland Kitchen CALCIUM PO Take 1,000 mg by mouth 2 (two) times daily.     . celecoxib (CELEBREX) 200 MG capsule Take 200  mg by mouth daily.    . cetirizine (ZYRTEC) 10 MG tablet Take 10 mg by mouth daily.    . Cholecalciferol (VITAMIN D3) 10000 units TABS Take 1,000 Units by mouth daily.    . citalopram (CELEXA) 20 MG tablet Take 20 mg by mouth daily.    . clonazePAM (KLONOPIN) 1 MG tablet Take 1 mg by mouth 2 (two) times daily.    . diclofenac sodium (VOLTAREN) 1 % GEL Apply 2 g topically 4 (four) times daily as needed (for pain).    Marland Kitchen ELIQUIS 5 MG TABS tablet Take 5 mg by mouth 2 (two) times daily.    . Hydrocortisone (YIFOYDXAJ-28 EX) Apply 1 application topically 4 (four) times daily as needed (for itchy skin.).    Marland Kitchen insulin aspart (NOVOLOG) 100 UNIT/ML injection Inject 20 Units into the skin daily with supper.     . Melatonin 10 MG TABS Take 10 mg by mouth at bedtime.    . methocarbamol (ROBAXIN) 500 MG tablet Take 1 tablet (500 mg total) by mouth 3 (three) times daily as needed. (Patient taking differently: Take 1,000 mg by mouth at bedtime. ) 60 tablet 1  . nitroGLYCERIN (NITROSTAT) 0.4 MG SL tablet Place 0.4 mg under the tongue every 5 (five) minutes as needed for chest pain.    Marland Kitchen OIL BASE EX Apply 1 application topically 4 (four) times daily as needed (for pain.). CBD OIL    . Omega-3 Fatty Acids (OMEGA-3 FISH OIL) 1200 MG CAPS Take 1,200 mg by mouth 2 (two) times daily.    . pantoprazole (PROTONIX) 40 MG tablet Take 40 mg by mouth daily.    . pioglitazone-metformin (ACTOPLUS MET) 15-850 MG per tablet Take 2 tablets by mouth daily.     . ranitidine (ZANTAC) 150 MG tablet Take 150 mg by mouth daily as needed for heartburn.    . rosuvastatin (CRESTOR) 20 MG tablet Take 20 mg by mouth daily.    . valsartan-hydrochlorothiazide (DIOVAN-HCT) 320-12.5 MG tablet Take 1 tablet by mouth daily.    . fluconazole (DIFLUCAN) 150 MG tablet Take 150 mg by mouth daily as needed (FOR YEAST INFECTIONS.).     No current facility-administered medications for this visit.     Review of Systems  Constitutional:   Constitutional negative. HENT: HENT negative.  Eyes: Eyes negative.  Respiratory: Respiratory negative.  Cardiovascular: Positive for leg swelling.  GI: Gastrointestinal negative.  Musculoskeletal: Positive for leg pain.  Skin: Skin negative.  Neurological: Neurological negative. Hematologic: Hematologic/lymphatic negative.  Psychiatric: Psychiatric negative.        Objective:  Objective   Vitals:   08/31/17 1203  BP: 129/77  Pulse: (!) 58  Resp: 20  Temp: 98.2 F (36.8 C)  TempSrc: Oral  SpO2: 95%  Weight: 209  lb (94.8 kg)  Height: '5\' 3"'$  (1.6 m)   Body mass index is 37.02 kg/m.  Physical Exam  Constitutional: She appears well-developed and well-nourished.  HENT:  Head: Normocephalic.  Eyes: Pupils are equal, round, and reactive to light.  Neck: Normal range of motion.  Cardiovascular: Normal rate.  Pulses:      Radial pulses are 2+ on the right side, and 2+ on the left side.       Dorsalis pedis pulses are 2+ on the right side, and 2+ on the left side.  Abdominal: Soft. She exhibits no mass.  Musculoskeletal: She exhibits edema.  Skin: Skin is warm and dry. Capillary refill takes less than 2 seconds.  Psychiatric: She has a normal mood and affect. Her behavior is normal. Judgment and thought content normal.    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  She is now doing well swelling is better wearing compression stockings.  We will stop her anticoagulation at this time she will continue aspirin lifelong see her in 1 year with reflux studies.       Waynetta Sandy MD [facility]

## 2017-09-17 DIAGNOSIS — E1165 Type 2 diabetes mellitus with hyperglycemia: Secondary | ICD-10-CM | POA: Diagnosis not present

## 2017-09-17 DIAGNOSIS — Z79899 Other long term (current) drug therapy: Secondary | ICD-10-CM | POA: Diagnosis not present

## 2017-09-17 DIAGNOSIS — E785 Hyperlipidemia, unspecified: Secondary | ICD-10-CM | POA: Diagnosis not present

## 2017-09-19 DIAGNOSIS — N183 Chronic kidney disease, stage 3 (moderate): Secondary | ICD-10-CM | POA: Diagnosis not present

## 2017-09-20 DIAGNOSIS — I10 Essential (primary) hypertension: Secondary | ICD-10-CM | POA: Diagnosis not present

## 2017-09-20 DIAGNOSIS — E1165 Type 2 diabetes mellitus with hyperglycemia: Secondary | ICD-10-CM | POA: Diagnosis not present

## 2017-09-20 DIAGNOSIS — E785 Hyperlipidemia, unspecified: Secondary | ICD-10-CM | POA: Diagnosis not present

## 2017-09-26 DIAGNOSIS — E1165 Type 2 diabetes mellitus with hyperglycemia: Secondary | ICD-10-CM | POA: Diagnosis not present

## 2017-09-26 DIAGNOSIS — Z794 Long term (current) use of insulin: Secondary | ICD-10-CM | POA: Diagnosis not present

## 2017-09-26 DIAGNOSIS — T39395A Adverse effect of other nonsteroidal anti-inflammatory drugs [NSAID], initial encounter: Secondary | ICD-10-CM | POA: Diagnosis not present

## 2017-09-26 DIAGNOSIS — N141 Nephropathy induced by other drugs, medicaments and biological substances: Secondary | ICD-10-CM | POA: Diagnosis not present

## 2017-09-26 DIAGNOSIS — E1122 Type 2 diabetes mellitus with diabetic chronic kidney disease: Secondary | ICD-10-CM | POA: Diagnosis not present

## 2017-09-26 DIAGNOSIS — N183 Chronic kidney disease, stage 3 (moderate): Secondary | ICD-10-CM | POA: Diagnosis not present

## 2017-09-26 DIAGNOSIS — N179 Acute kidney failure, unspecified: Secondary | ICD-10-CM | POA: Diagnosis not present

## 2017-10-02 DIAGNOSIS — N183 Chronic kidney disease, stage 3 (moderate): Secondary | ICD-10-CM | POA: Diagnosis not present

## 2017-10-03 DIAGNOSIS — I129 Hypertensive chronic kidney disease with stage 1 through stage 4 chronic kidney disease, or unspecified chronic kidney disease: Secondary | ICD-10-CM | POA: Diagnosis not present

## 2017-10-03 DIAGNOSIS — N183 Chronic kidney disease, stage 3 (moderate): Secondary | ICD-10-CM | POA: Diagnosis not present

## 2017-10-03 DIAGNOSIS — E1122 Type 2 diabetes mellitus with diabetic chronic kidney disease: Secondary | ICD-10-CM | POA: Diagnosis not present

## 2017-10-03 DIAGNOSIS — E042 Nontoxic multinodular goiter: Secondary | ICD-10-CM | POA: Diagnosis not present

## 2017-10-03 DIAGNOSIS — Z794 Long term (current) use of insulin: Secondary | ICD-10-CM | POA: Diagnosis not present

## 2017-10-19 DIAGNOSIS — E1165 Type 2 diabetes mellitus with hyperglycemia: Secondary | ICD-10-CM | POA: Diagnosis not present

## 2017-10-22 DIAGNOSIS — E1165 Type 2 diabetes mellitus with hyperglycemia: Secondary | ICD-10-CM | POA: Diagnosis not present

## 2017-10-22 DIAGNOSIS — F329 Major depressive disorder, single episode, unspecified: Secondary | ICD-10-CM | POA: Diagnosis not present

## 2017-11-01 DIAGNOSIS — E1165 Type 2 diabetes mellitus with hyperglycemia: Secondary | ICD-10-CM | POA: Diagnosis not present

## 2017-11-09 DIAGNOSIS — Z23 Encounter for immunization: Secondary | ICD-10-CM | POA: Diagnosis not present

## 2017-11-22 DIAGNOSIS — K219 Gastro-esophageal reflux disease without esophagitis: Secondary | ICD-10-CM | POA: Diagnosis not present

## 2017-11-22 DIAGNOSIS — I10 Essential (primary) hypertension: Secondary | ICD-10-CM | POA: Diagnosis not present

## 2017-11-29 DIAGNOSIS — Z79899 Other long term (current) drug therapy: Secondary | ICD-10-CM | POA: Diagnosis not present

## 2017-11-29 DIAGNOSIS — E1165 Type 2 diabetes mellitus with hyperglycemia: Secondary | ICD-10-CM | POA: Diagnosis not present

## 2017-11-29 DIAGNOSIS — E785 Hyperlipidemia, unspecified: Secondary | ICD-10-CM | POA: Diagnosis not present

## 2017-12-22 DIAGNOSIS — E1165 Type 2 diabetes mellitus with hyperglycemia: Secondary | ICD-10-CM | POA: Diagnosis not present

## 2017-12-22 DIAGNOSIS — E785 Hyperlipidemia, unspecified: Secondary | ICD-10-CM | POA: Diagnosis not present

## 2017-12-22 DIAGNOSIS — K219 Gastro-esophageal reflux disease without esophagitis: Secondary | ICD-10-CM | POA: Diagnosis not present

## 2017-12-22 DIAGNOSIS — I10 Essential (primary) hypertension: Secondary | ICD-10-CM | POA: Diagnosis not present

## 2018-01-14 DIAGNOSIS — N183 Chronic kidney disease, stage 3 (moderate): Secondary | ICD-10-CM | POA: Diagnosis not present

## 2018-01-14 DIAGNOSIS — E1122 Type 2 diabetes mellitus with diabetic chronic kidney disease: Secondary | ICD-10-CM | POA: Diagnosis not present

## 2018-01-14 DIAGNOSIS — Z794 Long term (current) use of insulin: Secondary | ICD-10-CM | POA: Diagnosis not present

## 2018-01-14 DIAGNOSIS — I129 Hypertensive chronic kidney disease with stage 1 through stage 4 chronic kidney disease, or unspecified chronic kidney disease: Secondary | ICD-10-CM | POA: Diagnosis not present

## 2018-01-14 DIAGNOSIS — E042 Nontoxic multinodular goiter: Secondary | ICD-10-CM | POA: Diagnosis not present

## 2018-01-22 DIAGNOSIS — K219 Gastro-esophageal reflux disease without esophagitis: Secondary | ICD-10-CM | POA: Diagnosis not present

## 2018-01-22 DIAGNOSIS — I10 Essential (primary) hypertension: Secondary | ICD-10-CM | POA: Diagnosis not present

## 2018-01-22 DIAGNOSIS — E1165 Type 2 diabetes mellitus with hyperglycemia: Secondary | ICD-10-CM | POA: Diagnosis not present

## 2018-02-08 DIAGNOSIS — I10 Essential (primary) hypertension: Secondary | ICD-10-CM | POA: Diagnosis not present

## 2018-02-08 DIAGNOSIS — N179 Acute kidney failure, unspecified: Secondary | ICD-10-CM | POA: Diagnosis not present

## 2018-02-12 DIAGNOSIS — T39395A Adverse effect of other nonsteroidal anti-inflammatory drugs [NSAID], initial encounter: Secondary | ICD-10-CM | POA: Diagnosis not present

## 2018-02-12 DIAGNOSIS — N141 Nephropathy induced by other drugs, medicaments and biological substances: Secondary | ICD-10-CM | POA: Diagnosis not present

## 2018-02-12 DIAGNOSIS — R809 Proteinuria, unspecified: Secondary | ICD-10-CM | POA: Diagnosis not present

## 2018-02-12 DIAGNOSIS — N183 Chronic kidney disease, stage 3 (moderate): Secondary | ICD-10-CM | POA: Diagnosis not present

## 2018-02-12 DIAGNOSIS — E1122 Type 2 diabetes mellitus with diabetic chronic kidney disease: Secondary | ICD-10-CM | POA: Diagnosis not present

## 2018-02-21 DIAGNOSIS — I10 Essential (primary) hypertension: Secondary | ICD-10-CM | POA: Diagnosis not present

## 2018-02-21 DIAGNOSIS — E1165 Type 2 diabetes mellitus with hyperglycemia: Secondary | ICD-10-CM | POA: Diagnosis not present

## 2018-02-21 DIAGNOSIS — E785 Hyperlipidemia, unspecified: Secondary | ICD-10-CM | POA: Diagnosis not present

## 2018-02-22 DIAGNOSIS — E1165 Type 2 diabetes mellitus with hyperglycemia: Secondary | ICD-10-CM | POA: Diagnosis not present

## 2018-02-22 DIAGNOSIS — Z79899 Other long term (current) drug therapy: Secondary | ICD-10-CM | POA: Diagnosis not present

## 2018-02-22 DIAGNOSIS — E785 Hyperlipidemia, unspecified: Secondary | ICD-10-CM | POA: Diagnosis not present

## 2018-03-23 DIAGNOSIS — E785 Hyperlipidemia, unspecified: Secondary | ICD-10-CM | POA: Diagnosis not present

## 2018-03-23 DIAGNOSIS — E1165 Type 2 diabetes mellitus with hyperglycemia: Secondary | ICD-10-CM | POA: Diagnosis not present

## 2018-03-23 DIAGNOSIS — I10 Essential (primary) hypertension: Secondary | ICD-10-CM | POA: Diagnosis not present

## 2018-04-23 DIAGNOSIS — I10 Essential (primary) hypertension: Secondary | ICD-10-CM | POA: Diagnosis not present

## 2018-04-23 DIAGNOSIS — E785 Hyperlipidemia, unspecified: Secondary | ICD-10-CM | POA: Diagnosis not present

## 2018-05-21 DIAGNOSIS — Z1339 Encounter for screening examination for other mental health and behavioral disorders: Secondary | ICD-10-CM | POA: Diagnosis not present

## 2018-05-21 DIAGNOSIS — L989 Disorder of the skin and subcutaneous tissue, unspecified: Secondary | ICD-10-CM | POA: Diagnosis not present

## 2018-05-21 DIAGNOSIS — I10 Essential (primary) hypertension: Secondary | ICD-10-CM | POA: Diagnosis not present

## 2018-05-21 DIAGNOSIS — M810 Age-related osteoporosis without current pathological fracture: Secondary | ICD-10-CM | POA: Diagnosis not present

## 2018-05-21 DIAGNOSIS — Z0000 Encounter for general adult medical examination without abnormal findings: Secondary | ICD-10-CM | POA: Diagnosis not present

## 2018-05-21 DIAGNOSIS — Z9181 History of falling: Secondary | ICD-10-CM | POA: Diagnosis not present

## 2018-05-21 DIAGNOSIS — E7800 Pure hypercholesterolemia, unspecified: Secondary | ICD-10-CM | POA: Diagnosis not present

## 2018-05-21 DIAGNOSIS — Z6837 Body mass index (BMI) 37.0-37.9, adult: Secondary | ICD-10-CM | POA: Diagnosis not present

## 2018-05-21 DIAGNOSIS — Z1331 Encounter for screening for depression: Secondary | ICD-10-CM | POA: Diagnosis not present

## 2018-05-21 DIAGNOSIS — E1165 Type 2 diabetes mellitus with hyperglycemia: Secondary | ICD-10-CM | POA: Diagnosis not present

## 2018-05-23 DIAGNOSIS — I10 Essential (primary) hypertension: Secondary | ICD-10-CM | POA: Diagnosis not present

## 2018-05-23 DIAGNOSIS — E785 Hyperlipidemia, unspecified: Secondary | ICD-10-CM | POA: Diagnosis not present

## 2018-06-07 DIAGNOSIS — N183 Chronic kidney disease, stage 3 (moderate): Secondary | ICD-10-CM | POA: Diagnosis not present

## 2018-06-08 DIAGNOSIS — L299 Pruritus, unspecified: Secondary | ICD-10-CM | POA: Diagnosis not present

## 2018-06-08 DIAGNOSIS — C44321 Squamous cell carcinoma of skin of nose: Secondary | ICD-10-CM | POA: Diagnosis not present

## 2018-06-08 DIAGNOSIS — L219 Seborrheic dermatitis, unspecified: Secondary | ICD-10-CM | POA: Diagnosis not present

## 2018-06-08 DIAGNOSIS — L853 Xerosis cutis: Secondary | ICD-10-CM | POA: Diagnosis not present

## 2018-06-11 DIAGNOSIS — E1122 Type 2 diabetes mellitus with diabetic chronic kidney disease: Secondary | ICD-10-CM | POA: Diagnosis not present

## 2018-06-11 DIAGNOSIS — N183 Chronic kidney disease, stage 3 (moderate): Secondary | ICD-10-CM | POA: Diagnosis not present

## 2018-06-11 DIAGNOSIS — T39395A Adverse effect of other nonsteroidal anti-inflammatory drugs [NSAID], initial encounter: Secondary | ICD-10-CM | POA: Diagnosis not present

## 2018-06-11 DIAGNOSIS — I129 Hypertensive chronic kidney disease with stage 1 through stage 4 chronic kidney disease, or unspecified chronic kidney disease: Secondary | ICD-10-CM | POA: Diagnosis not present

## 2018-06-11 DIAGNOSIS — N141 Nephropathy induced by other drugs, medicaments and biological substances: Secondary | ICD-10-CM | POA: Diagnosis not present

## 2018-06-11 DIAGNOSIS — R809 Proteinuria, unspecified: Secondary | ICD-10-CM | POA: Diagnosis not present

## 2018-06-22 DIAGNOSIS — I10 Essential (primary) hypertension: Secondary | ICD-10-CM | POA: Diagnosis not present

## 2018-06-22 DIAGNOSIS — E1165 Type 2 diabetes mellitus with hyperglycemia: Secondary | ICD-10-CM | POA: Diagnosis not present

## 2018-07-05 DIAGNOSIS — Z1231 Encounter for screening mammogram for malignant neoplasm of breast: Secondary | ICD-10-CM | POA: Diagnosis not present

## 2018-07-17 DIAGNOSIS — Z794 Long term (current) use of insulin: Secondary | ICD-10-CM | POA: Diagnosis not present

## 2018-07-17 DIAGNOSIS — E1122 Type 2 diabetes mellitus with diabetic chronic kidney disease: Secondary | ICD-10-CM | POA: Diagnosis not present

## 2018-07-17 DIAGNOSIS — E042 Nontoxic multinodular goiter: Secondary | ICD-10-CM | POA: Diagnosis not present

## 2018-07-17 DIAGNOSIS — E119 Type 2 diabetes mellitus without complications: Secondary | ICD-10-CM | POA: Diagnosis not present

## 2018-07-17 DIAGNOSIS — N183 Chronic kidney disease, stage 3 (moderate): Secondary | ICD-10-CM | POA: Diagnosis not present

## 2018-07-17 DIAGNOSIS — I10 Essential (primary) hypertension: Secondary | ICD-10-CM | POA: Diagnosis not present

## 2018-07-22 DIAGNOSIS — N6002 Solitary cyst of left breast: Secondary | ICD-10-CM | POA: Diagnosis not present

## 2018-07-22 DIAGNOSIS — N6459 Other signs and symptoms in breast: Secondary | ICD-10-CM | POA: Diagnosis not present

## 2018-07-22 DIAGNOSIS — R928 Other abnormal and inconclusive findings on diagnostic imaging of breast: Secondary | ICD-10-CM | POA: Diagnosis not present

## 2018-07-23 DIAGNOSIS — N183 Chronic kidney disease, stage 3 (moderate): Secondary | ICD-10-CM | POA: Diagnosis not present

## 2018-07-23 DIAGNOSIS — E785 Hyperlipidemia, unspecified: Secondary | ICD-10-CM | POA: Diagnosis not present

## 2018-07-23 DIAGNOSIS — E1165 Type 2 diabetes mellitus with hyperglycemia: Secondary | ICD-10-CM | POA: Diagnosis not present

## 2018-08-21 ENCOUNTER — Other Ambulatory Visit: Payer: Self-pay

## 2018-08-23 DIAGNOSIS — E785 Hyperlipidemia, unspecified: Secondary | ICD-10-CM | POA: Diagnosis not present

## 2018-08-23 DIAGNOSIS — I10 Essential (primary) hypertension: Secondary | ICD-10-CM | POA: Diagnosis not present

## 2018-08-23 DIAGNOSIS — E1165 Type 2 diabetes mellitus with hyperglycemia: Secondary | ICD-10-CM | POA: Diagnosis not present

## 2018-09-23 DIAGNOSIS — E785 Hyperlipidemia, unspecified: Secondary | ICD-10-CM | POA: Diagnosis not present

## 2018-09-23 DIAGNOSIS — I10 Essential (primary) hypertension: Secondary | ICD-10-CM | POA: Diagnosis not present

## 2018-09-23 DIAGNOSIS — E1165 Type 2 diabetes mellitus with hyperglycemia: Secondary | ICD-10-CM | POA: Diagnosis not present

## 2018-10-03 DIAGNOSIS — Z23 Encounter for immunization: Secondary | ICD-10-CM | POA: Diagnosis not present

## 2018-10-10 DIAGNOSIS — S91311A Laceration without foreign body, right foot, initial encounter: Secondary | ICD-10-CM | POA: Diagnosis not present

## 2018-10-10 DIAGNOSIS — Z6837 Body mass index (BMI) 37.0-37.9, adult: Secondary | ICD-10-CM | POA: Diagnosis not present

## 2018-10-23 DIAGNOSIS — E782 Mixed hyperlipidemia: Secondary | ICD-10-CM | POA: Diagnosis not present

## 2018-10-23 DIAGNOSIS — E1165 Type 2 diabetes mellitus with hyperglycemia: Secondary | ICD-10-CM | POA: Diagnosis not present

## 2018-11-01 DIAGNOSIS — E785 Hyperlipidemia, unspecified: Secondary | ICD-10-CM | POA: Diagnosis not present

## 2018-11-01 DIAGNOSIS — E1165 Type 2 diabetes mellitus with hyperglycemia: Secondary | ICD-10-CM | POA: Diagnosis not present

## 2018-11-01 DIAGNOSIS — N183 Chronic kidney disease, stage 3 unspecified: Secondary | ICD-10-CM | POA: Diagnosis not present

## 2018-11-01 DIAGNOSIS — Z79899 Other long term (current) drug therapy: Secondary | ICD-10-CM | POA: Diagnosis not present

## 2018-11-04 DIAGNOSIS — R197 Diarrhea, unspecified: Secondary | ICD-10-CM | POA: Diagnosis not present

## 2018-11-04 DIAGNOSIS — R112 Nausea with vomiting, unspecified: Secondary | ICD-10-CM | POA: Diagnosis not present

## 2018-11-04 DIAGNOSIS — Z03818 Encounter for observation for suspected exposure to other biological agents ruled out: Secondary | ICD-10-CM | POA: Diagnosis not present

## 2018-11-04 DIAGNOSIS — R531 Weakness: Secondary | ICD-10-CM | POA: Diagnosis not present

## 2018-11-04 DIAGNOSIS — E1069 Type 1 diabetes mellitus with other specified complication: Secondary | ICD-10-CM | POA: Diagnosis not present

## 2018-11-07 DIAGNOSIS — K591 Functional diarrhea: Secondary | ICD-10-CM | POA: Diagnosis not present

## 2018-11-07 DIAGNOSIS — K219 Gastro-esophageal reflux disease without esophagitis: Secondary | ICD-10-CM | POA: Diagnosis not present

## 2018-11-07 DIAGNOSIS — K222 Esophageal obstruction: Secondary | ICD-10-CM | POA: Diagnosis not present

## 2018-11-19 DIAGNOSIS — E1122 Type 2 diabetes mellitus with diabetic chronic kidney disease: Secondary | ICD-10-CM | POA: Diagnosis not present

## 2018-11-19 DIAGNOSIS — I129 Hypertensive chronic kidney disease with stage 1 through stage 4 chronic kidney disease, or unspecified chronic kidney disease: Secondary | ICD-10-CM | POA: Diagnosis not present

## 2018-11-19 DIAGNOSIS — E042 Nontoxic multinodular goiter: Secondary | ICD-10-CM | POA: Diagnosis not present

## 2018-11-19 DIAGNOSIS — N183 Chronic kidney disease, stage 3 unspecified: Secondary | ICD-10-CM | POA: Diagnosis not present

## 2018-11-22 DIAGNOSIS — I10 Essential (primary) hypertension: Secondary | ICD-10-CM | POA: Diagnosis not present

## 2018-11-22 DIAGNOSIS — K219 Gastro-esophageal reflux disease without esophagitis: Secondary | ICD-10-CM | POA: Diagnosis not present

## 2018-11-22 DIAGNOSIS — E1165 Type 2 diabetes mellitus with hyperglycemia: Secondary | ICD-10-CM | POA: Diagnosis not present

## 2018-12-09 DIAGNOSIS — N183 Chronic kidney disease, stage 3 unspecified: Secondary | ICD-10-CM | POA: Diagnosis not present

## 2018-12-09 DIAGNOSIS — N1831 Chronic kidney disease, stage 3a: Secondary | ICD-10-CM | POA: Diagnosis not present

## 2018-12-12 DIAGNOSIS — R809 Proteinuria, unspecified: Secondary | ICD-10-CM | POA: Diagnosis not present

## 2018-12-12 DIAGNOSIS — E559 Vitamin D deficiency, unspecified: Secondary | ICD-10-CM | POA: Diagnosis not present

## 2018-12-12 DIAGNOSIS — E1122 Type 2 diabetes mellitus with diabetic chronic kidney disease: Secondary | ICD-10-CM | POA: Diagnosis not present

## 2018-12-12 DIAGNOSIS — E889 Metabolic disorder, unspecified: Secondary | ICD-10-CM | POA: Diagnosis not present

## 2018-12-12 DIAGNOSIS — I129 Hypertensive chronic kidney disease with stage 1 through stage 4 chronic kidney disease, or unspecified chronic kidney disease: Secondary | ICD-10-CM | POA: Diagnosis not present

## 2018-12-12 DIAGNOSIS — N183 Chronic kidney disease, stage 3 unspecified: Secondary | ICD-10-CM | POA: Diagnosis not present

## 2018-12-12 DIAGNOSIS — M9080 Osteopathy in diseases classified elsewhere, unspecified site: Secondary | ICD-10-CM | POA: Diagnosis not present

## 2018-12-23 DIAGNOSIS — I10 Essential (primary) hypertension: Secondary | ICD-10-CM | POA: Diagnosis not present

## 2018-12-23 DIAGNOSIS — E7800 Pure hypercholesterolemia, unspecified: Secondary | ICD-10-CM | POA: Diagnosis not present

## 2018-12-23 DIAGNOSIS — K219 Gastro-esophageal reflux disease without esophagitis: Secondary | ICD-10-CM | POA: Diagnosis not present

## 2019-01-23 DIAGNOSIS — I10 Essential (primary) hypertension: Secondary | ICD-10-CM | POA: Diagnosis not present

## 2019-01-23 DIAGNOSIS — K219 Gastro-esophageal reflux disease without esophagitis: Secondary | ICD-10-CM | POA: Diagnosis not present

## 2019-02-21 DIAGNOSIS — Z794 Long term (current) use of insulin: Secondary | ICD-10-CM | POA: Diagnosis not present

## 2019-02-21 DIAGNOSIS — I10 Essential (primary) hypertension: Secondary | ICD-10-CM | POA: Diagnosis not present

## 2019-02-21 DIAGNOSIS — N183 Chronic kidney disease, stage 3 unspecified: Secondary | ICD-10-CM | POA: Diagnosis not present

## 2019-02-21 DIAGNOSIS — E042 Nontoxic multinodular goiter: Secondary | ICD-10-CM | POA: Diagnosis not present

## 2019-02-21 DIAGNOSIS — E1122 Type 2 diabetes mellitus with diabetic chronic kidney disease: Secondary | ICD-10-CM | POA: Diagnosis not present

## 2019-02-21 DIAGNOSIS — E119 Type 2 diabetes mellitus without complications: Secondary | ICD-10-CM | POA: Diagnosis not present

## 2019-02-22 DIAGNOSIS — E1165 Type 2 diabetes mellitus with hyperglycemia: Secondary | ICD-10-CM | POA: Diagnosis not present

## 2019-02-22 DIAGNOSIS — E7800 Pure hypercholesterolemia, unspecified: Secondary | ICD-10-CM | POA: Diagnosis not present

## 2019-02-22 DIAGNOSIS — I10 Essential (primary) hypertension: Secondary | ICD-10-CM | POA: Diagnosis not present

## 2019-03-04 DIAGNOSIS — Z6836 Body mass index (BMI) 36.0-36.9, adult: Secondary | ICD-10-CM | POA: Diagnosis not present

## 2019-03-04 DIAGNOSIS — E1165 Type 2 diabetes mellitus with hyperglycemia: Secondary | ICD-10-CM | POA: Diagnosis not present

## 2019-03-20 ENCOUNTER — Ambulatory Visit: Payer: PPO | Attending: Internal Medicine

## 2019-03-20 DIAGNOSIS — Z23 Encounter for immunization: Secondary | ICD-10-CM | POA: Insufficient documentation

## 2019-03-20 NOTE — Progress Notes (Signed)
   Covid-19 Vaccination Clinic  Name:  Whitney [name]    MRN: [id number] DOB: 12/28/1945  03/20/2019  [name] was observed post Covid-19 immunization for 15 minutes without incidence. She was provided with Vaccine Information Sheet and instruction to access the V-Safe system.   [name] was instructed to call 911 with any severe reactions post vaccine: Marland Kitchen Difficulty breathing  . Swelling of your face and throat  . A fast heartbeat  . A bad rash all over your body  . Dizziness and weakness    Immunizations Administered    Name Date Dose VIS Date Route   Pfizer COVID-19 Vaccine 03/20/2019  3:06 PM 0.3 mL 01/03/2019 Intramuscular   Manufacturer: [place]   Lot: [id number]   [place]: [id number]

## 2019-03-21 ENCOUNTER — Ambulatory Visit: Payer: PPO

## 2019-03-23 DIAGNOSIS — E1165 Type 2 diabetes mellitus with hyperglycemia: Secondary | ICD-10-CM | POA: Diagnosis not present

## 2019-03-23 DIAGNOSIS — K219 Gastro-esophageal reflux disease without esophagitis: Secondary | ICD-10-CM | POA: Diagnosis not present

## 2019-03-23 DIAGNOSIS — M810 Age-related osteoporosis without current pathological fracture: Secondary | ICD-10-CM | POA: Diagnosis not present

## 2019-03-23 DIAGNOSIS — E7800 Pure hypercholesterolemia, unspecified: Secondary | ICD-10-CM | POA: Diagnosis not present

## 2019-03-23 DIAGNOSIS — I10 Essential (primary) hypertension: Secondary | ICD-10-CM | POA: Diagnosis not present

## 2019-04-15 ENCOUNTER — Ambulatory Visit: Payer: PPO | Attending: Internal Medicine

## 2019-04-15 DIAGNOSIS — Z23 Encounter for immunization: Secondary | ICD-10-CM

## 2019-04-15 NOTE — Progress Notes (Signed)
   Covid-19 Vaccination Clinic  Name:  Whitney [name]    MRN: [id number] DOB: Oct 12, 1945  04/15/2019  Whitney [name] was observed post Covid-19 immunization for 15 minutes without incident. She was provided with Vaccine Information Sheet and instruction to access the V-Safe system.   Whitney [name] was instructed to call 911 with any severe reactions post vaccine: Marland Kitchen Difficulty breathing  . Swelling of face and throat  . A fast heartbeat  . A bad rash all over body  . Dizziness and weakness   Immunizations Administered    Name Date Dose VIS Date Route   Pfizer COVID-19 Vaccine 04/15/2019  1:56 PM 0.3 mL 01/03/2019 Intramuscular   Manufacturer: [place]   Lot: [id number]   [place]: [id number]

## 2019-04-17 DIAGNOSIS — H02889 Meibomian gland dysfunction of unspecified eye, unspecified eyelid: Secondary | ICD-10-CM | POA: Diagnosis not present

## 2019-04-17 DIAGNOSIS — E113293 Type 2 diabetes mellitus with mild nonproliferative diabetic retinopathy without macular edema, bilateral: Secondary | ICD-10-CM | POA: Diagnosis not present

## 2019-04-17 DIAGNOSIS — Z794 Long term (current) use of insulin: Secondary | ICD-10-CM | POA: Diagnosis not present

## 2019-04-17 DIAGNOSIS — Z961 Presence of intraocular lens: Secondary | ICD-10-CM | POA: Diagnosis not present

## 2019-05-23 DIAGNOSIS — E1165 Type 2 diabetes mellitus with hyperglycemia: Secondary | ICD-10-CM | POA: Diagnosis not present

## 2019-05-23 DIAGNOSIS — K219 Gastro-esophageal reflux disease without esophagitis: Secondary | ICD-10-CM | POA: Diagnosis not present

## 2019-05-23 DIAGNOSIS — I10 Essential (primary) hypertension: Secondary | ICD-10-CM | POA: Diagnosis not present

## 2019-06-09 DIAGNOSIS — N183 Chronic kidney disease, stage 3 unspecified: Secondary | ICD-10-CM | POA: Diagnosis not present

## 2019-06-09 DIAGNOSIS — Z0000 Encounter for general adult medical examination without abnormal findings: Secondary | ICD-10-CM | POA: Diagnosis not present

## 2019-06-12 DIAGNOSIS — E559 Vitamin D deficiency, unspecified: Secondary | ICD-10-CM | POA: Diagnosis not present

## 2019-06-12 DIAGNOSIS — R7989 Other specified abnormal findings of blood chemistry: Secondary | ICD-10-CM | POA: Diagnosis not present

## 2019-06-12 DIAGNOSIS — M9080 Osteopathy in diseases classified elsewhere, unspecified site: Secondary | ICD-10-CM | POA: Diagnosis not present

## 2019-06-12 DIAGNOSIS — E1122 Type 2 diabetes mellitus with diabetic chronic kidney disease: Secondary | ICD-10-CM | POA: Diagnosis not present

## 2019-06-12 DIAGNOSIS — E875 Hyperkalemia: Secondary | ICD-10-CM | POA: Diagnosis not present

## 2019-06-12 DIAGNOSIS — E889 Metabolic disorder, unspecified: Secondary | ICD-10-CM | POA: Diagnosis not present

## 2019-06-12 DIAGNOSIS — I129 Hypertensive chronic kidney disease with stage 1 through stage 4 chronic kidney disease, or unspecified chronic kidney disease: Secondary | ICD-10-CM | POA: Diagnosis not present

## 2019-06-12 DIAGNOSIS — N183 Chronic kidney disease, stage 3 unspecified: Secondary | ICD-10-CM | POA: Diagnosis not present

## 2019-06-23 DIAGNOSIS — E1165 Type 2 diabetes mellitus with hyperglycemia: Secondary | ICD-10-CM | POA: Diagnosis not present

## 2019-06-23 DIAGNOSIS — E785 Hyperlipidemia, unspecified: Secondary | ICD-10-CM | POA: Diagnosis not present

## 2019-06-23 DIAGNOSIS — I10 Essential (primary) hypertension: Secondary | ICD-10-CM | POA: Diagnosis not present

## 2019-06-24 DIAGNOSIS — E1122 Type 2 diabetes mellitus with diabetic chronic kidney disease: Secondary | ICD-10-CM | POA: Diagnosis not present

## 2019-06-24 DIAGNOSIS — I129 Hypertensive chronic kidney disease with stage 1 through stage 4 chronic kidney disease, or unspecified chronic kidney disease: Secondary | ICD-10-CM | POA: Diagnosis not present

## 2019-06-24 DIAGNOSIS — N183 Chronic kidney disease, stage 3 unspecified: Secondary | ICD-10-CM | POA: Diagnosis not present

## 2019-06-24 DIAGNOSIS — E042 Nontoxic multinodular goiter: Secondary | ICD-10-CM | POA: Diagnosis not present

## 2019-08-23 DIAGNOSIS — E785 Hyperlipidemia, unspecified: Secondary | ICD-10-CM | POA: Diagnosis not present

## 2019-08-23 DIAGNOSIS — I129 Hypertensive chronic kidney disease with stage 1 through stage 4 chronic kidney disease, or unspecified chronic kidney disease: Secondary | ICD-10-CM | POA: Diagnosis not present

## 2019-08-23 DIAGNOSIS — N183 Chronic kidney disease, stage 3 unspecified: Secondary | ICD-10-CM | POA: Diagnosis not present

## 2019-08-23 DIAGNOSIS — E1122 Type 2 diabetes mellitus with diabetic chronic kidney disease: Secondary | ICD-10-CM | POA: Diagnosis not present

## 2019-09-13 IMAGING — MR MR LUMBAR SPINE WO/W CM
4 of 7 series · 17 of 48 positions shown · IV contrast (multihance)
Comparison: None.

CLINICAL DATA: Low back pain.  Left leg pain.

EXAM:
MRI LUMBAR SPINE WITHOUT AND WITH CONTRAST
TECHNIQUE: Multiplanar and multiecho pulse sequences of the lumbar spine were
obtained without and with intravenous contrast.
Creatinine was obtained on site at [HOSPITAL] at [HOSPITAL].
Results: Creatinine 1.2 mg/dL.
CONTRAST:  18mL MULTIHANCE GADOBENATE DIMEGLUMINE 529 MG/ML IV SOLN

[Series 6: T1 · sagittal · 4.0mm · 0.73mm/px · 3 of 15 slices shown (1 of 2)]
[im 1/15]
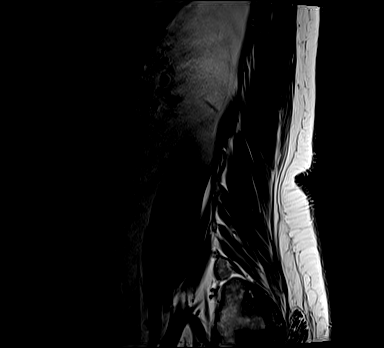
[im 8/15]
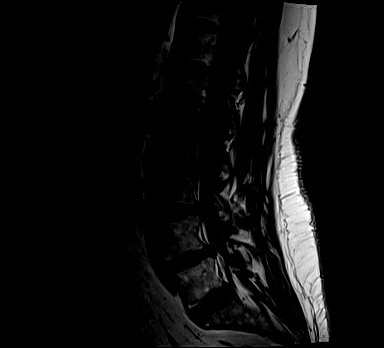
[im 15/15]
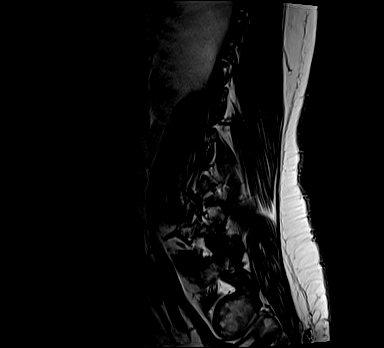

[Series 11: T1 · axial · 4.0mm · 0.28mm/px · z∈[-39,+159]mm · 3 of 40 slices shown (2 of 2)]
[im 4/40]
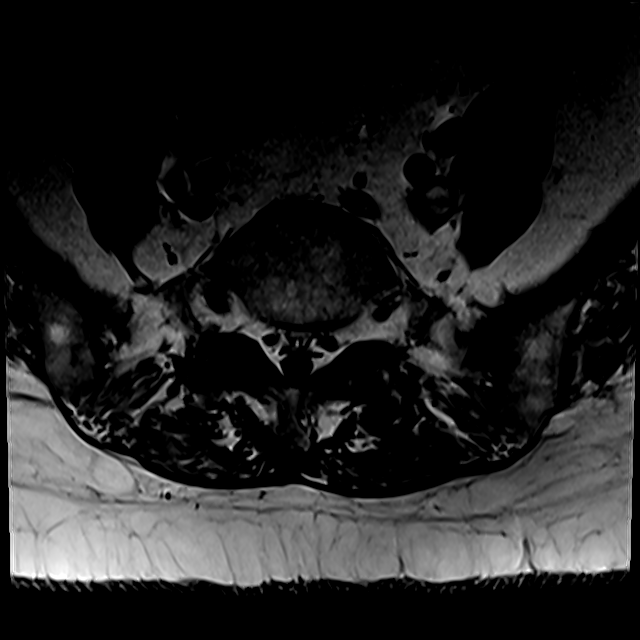
[im 20/40]
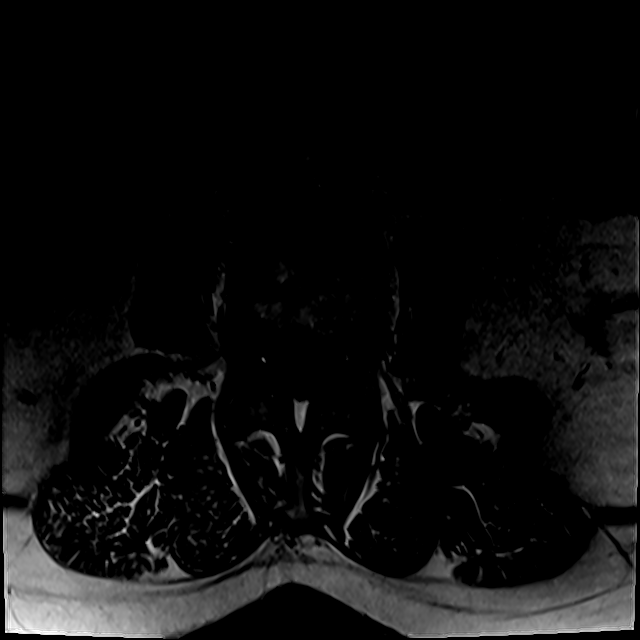
[im 36/40]
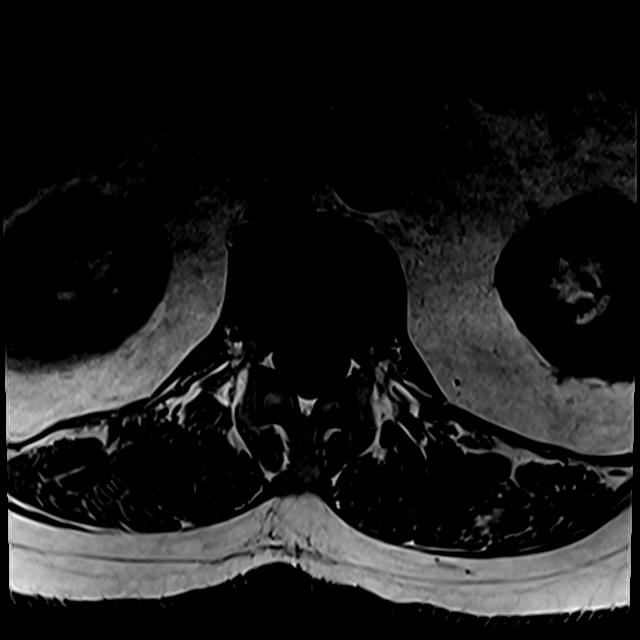

[Series 14: T2 · axial · 4.0mm · 0.28mm/px · z∈[-53,+159]mm · 8 of 40 slices shown (1 of 2)]
[im 1/40]
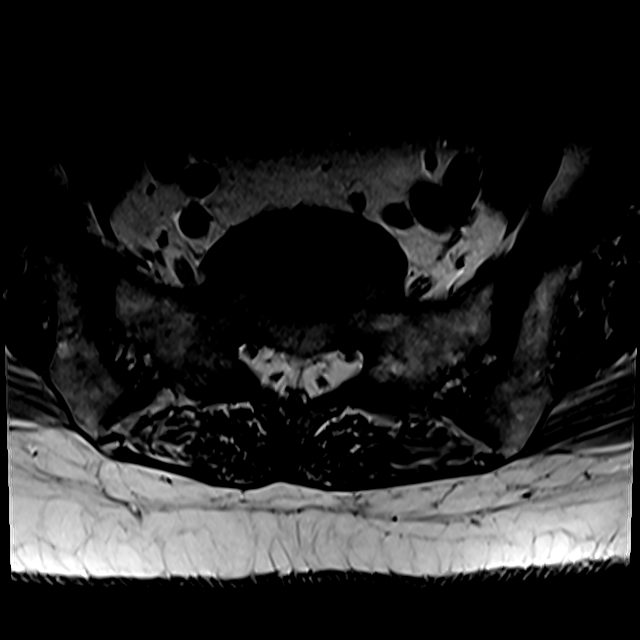
[im 4/40]
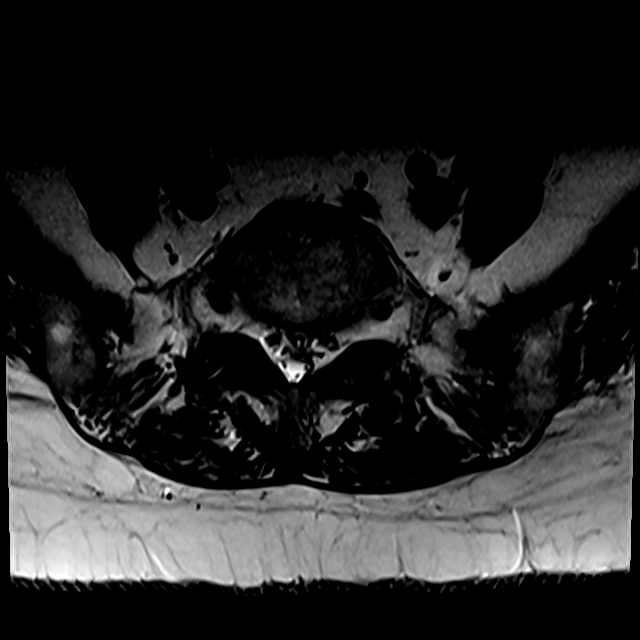
[im 8/40]
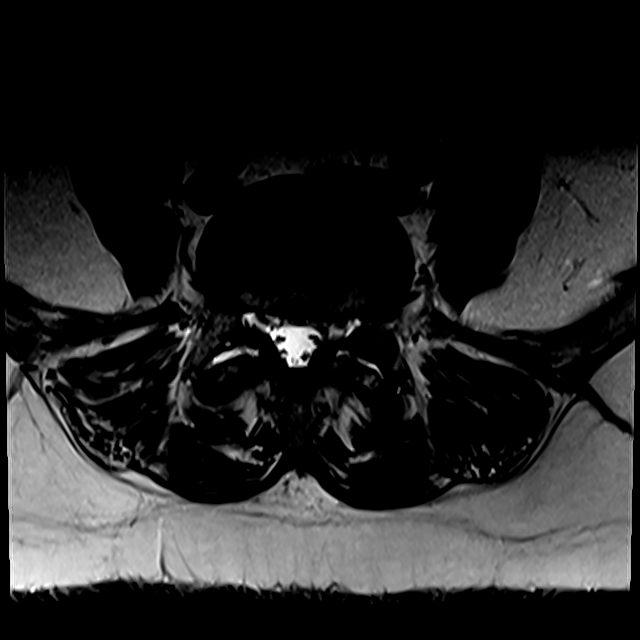
[im 12/40]
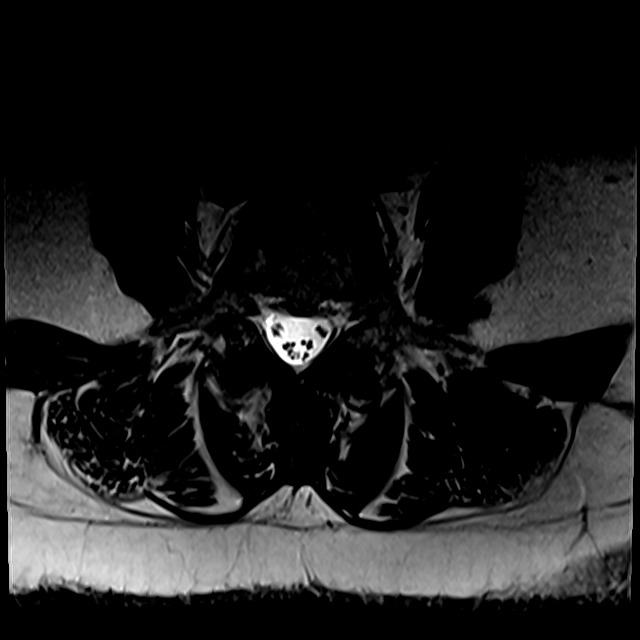
[im 16/40]
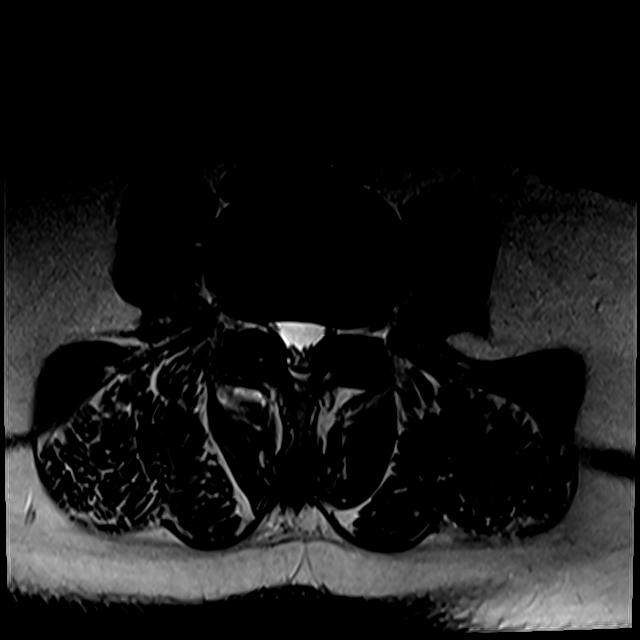
[im 20/40]
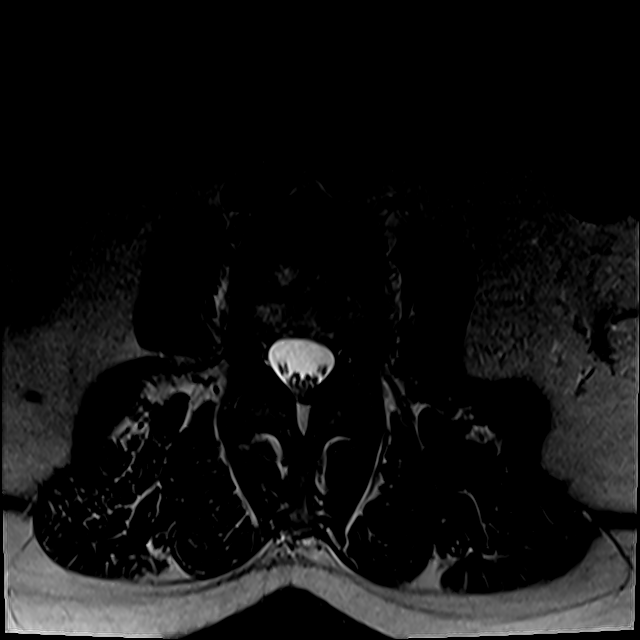
[im 24/40]
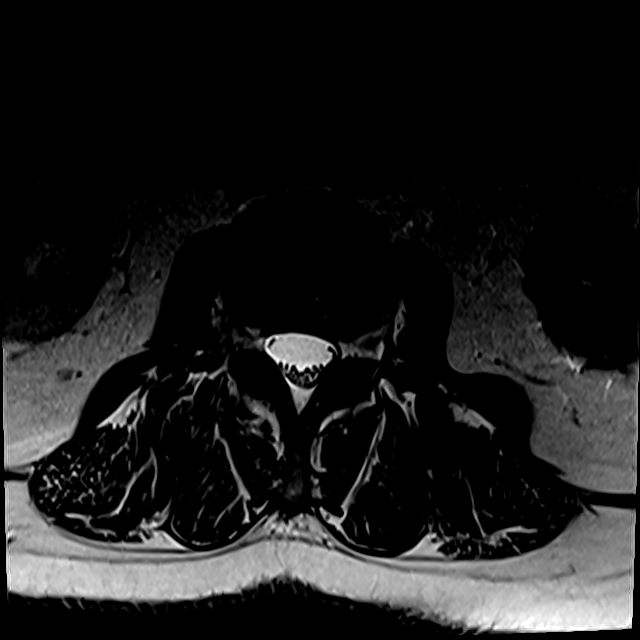
[im 36/40]
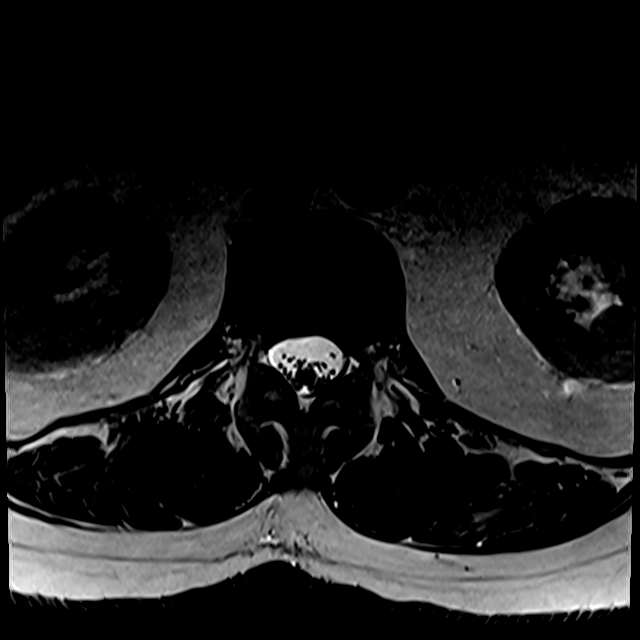

[Series 15: T2 · sagittal · 4.0mm · 0.73mm/px · 3 of 15 slices shown (2 of 2)]
[im 1/15]
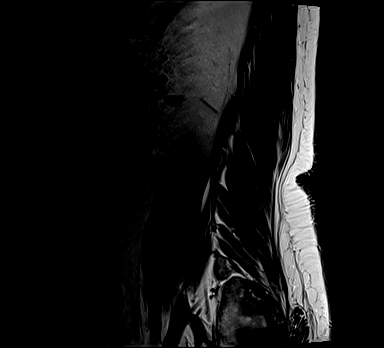
[im 10/15]
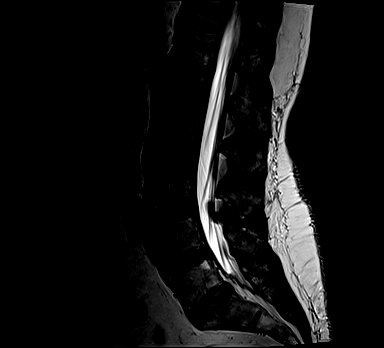
[im 15/15]
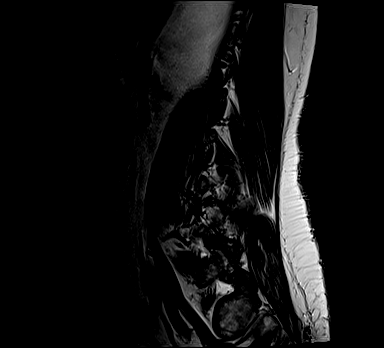

[17 of 48 positions shown; findings below may reference images not displayed]

FINDINGS: Segmentation:  Standard

Alignment:  Mild anterolisthesis at L3-4 and L4-5, facet mediated.

Vertebrae: No fracture, evidence of discitis, or bone lesion. There
is enhancement about the bilateral L4-5 facets.

Conus medullaris and cauda equina: Conus extends to the L1 level.
Conus and cauda equina appear normal.

Paraspinal and other soft tissues: Negative

Disc levels:

T12- L1: Tiny right paracentral disc protrusion.  No impingement

L1-L2: Small left inferior foraminal protrusion best seen on
sagittal acquisition. No impingement

L2-L3: Small left inferior foraminal protrusion on sagittal
acquisition. No impingement.

L3-L4: Degenerative facet spurring. Disc narrowing and bulging. No
compressive stenosis

L4-L5: Disc narrowing with shallow central protrusion. Degenerative
facet hypertrophy with small joint effusions. Subarticular recess
narrowing with left more than right L5 impingement. Patent foramina

L5-S1:Degenerative facet spurring. Negative disc space. No
impingement.
IMPRESSION: 1. Degenerative facet arthropathy at L3-4 and below with mild
anterolisthesis at L3-4 and L4-5. Active facet arthritis at L4-5.
2. L4-5 subarticular recess stenosis with left more than right L5
impingement.
3. Other and noncompressive degenerative changes described above.

## 2019-09-23 DIAGNOSIS — I129 Hypertensive chronic kidney disease with stage 1 through stage 4 chronic kidney disease, or unspecified chronic kidney disease: Secondary | ICD-10-CM | POA: Diagnosis not present

## 2019-09-23 DIAGNOSIS — E1122 Type 2 diabetes mellitus with diabetic chronic kidney disease: Secondary | ICD-10-CM | POA: Diagnosis not present

## 2019-09-23 DIAGNOSIS — E785 Hyperlipidemia, unspecified: Secondary | ICD-10-CM | POA: Diagnosis not present

## 2019-09-23 DIAGNOSIS — N183 Chronic kidney disease, stage 3 unspecified: Secondary | ICD-10-CM | POA: Diagnosis not present

## 2019-10-13 DIAGNOSIS — N183 Chronic kidney disease, stage 3 unspecified: Secondary | ICD-10-CM | POA: Diagnosis not present

## 2019-10-16 DIAGNOSIS — N183 Chronic kidney disease, stage 3 unspecified: Secondary | ICD-10-CM | POA: Diagnosis not present

## 2019-10-16 DIAGNOSIS — I952 Hypotension due to drugs: Secondary | ICD-10-CM | POA: Diagnosis not present

## 2019-10-16 DIAGNOSIS — E1122 Type 2 diabetes mellitus with diabetic chronic kidney disease: Secondary | ICD-10-CM | POA: Diagnosis not present

## 2019-10-16 DIAGNOSIS — I129 Hypertensive chronic kidney disease with stage 1 through stage 4 chronic kidney disease, or unspecified chronic kidney disease: Secondary | ICD-10-CM | POA: Diagnosis not present

## 2019-10-16 DIAGNOSIS — E559 Vitamin D deficiency, unspecified: Secondary | ICD-10-CM | POA: Diagnosis not present

## 2019-10-16 DIAGNOSIS — E889 Metabolic disorder, unspecified: Secondary | ICD-10-CM | POA: Diagnosis not present

## 2019-10-16 DIAGNOSIS — E875 Hyperkalemia: Secondary | ICD-10-CM | POA: Diagnosis not present

## 2019-10-16 DIAGNOSIS — M9080 Osteopathy in diseases classified elsewhere, unspecified site: Secondary | ICD-10-CM | POA: Diagnosis not present

## 2019-10-23 DIAGNOSIS — I129 Hypertensive chronic kidney disease with stage 1 through stage 4 chronic kidney disease, or unspecified chronic kidney disease: Secondary | ICD-10-CM | POA: Diagnosis not present

## 2019-10-23 DIAGNOSIS — E1122 Type 2 diabetes mellitus with diabetic chronic kidney disease: Secondary | ICD-10-CM | POA: Diagnosis not present

## 2019-10-23 DIAGNOSIS — E113293 Type 2 diabetes mellitus with mild nonproliferative diabetic retinopathy without macular edema, bilateral: Secondary | ICD-10-CM | POA: Diagnosis not present

## 2019-10-23 DIAGNOSIS — E785 Hyperlipidemia, unspecified: Secondary | ICD-10-CM | POA: Diagnosis not present

## 2019-10-23 DIAGNOSIS — N183 Chronic kidney disease, stage 3 unspecified: Secondary | ICD-10-CM | POA: Diagnosis not present

## 2019-11-17 DIAGNOSIS — K219 Gastro-esophageal reflux disease without esophagitis: Secondary | ICD-10-CM | POA: Diagnosis not present

## 2019-11-17 DIAGNOSIS — K2270 Barrett's esophagus without dysplasia: Secondary | ICD-10-CM | POA: Diagnosis not present

## 2019-11-22 DIAGNOSIS — I129 Hypertensive chronic kidney disease with stage 1 through stage 4 chronic kidney disease, or unspecified chronic kidney disease: Secondary | ICD-10-CM | POA: Diagnosis not present

## 2019-11-22 DIAGNOSIS — N183 Chronic kidney disease, stage 3 unspecified: Secondary | ICD-10-CM | POA: Diagnosis not present

## 2019-11-22 DIAGNOSIS — E1122 Type 2 diabetes mellitus with diabetic chronic kidney disease: Secondary | ICD-10-CM | POA: Diagnosis not present

## 2019-11-22 DIAGNOSIS — E785 Hyperlipidemia, unspecified: Secondary | ICD-10-CM | POA: Diagnosis not present

## 2019-11-28 DIAGNOSIS — Z1159 Encounter for screening for other viral diseases: Secondary | ICD-10-CM | POA: Diagnosis not present

## 2019-12-05 DIAGNOSIS — I10 Essential (primary) hypertension: Secondary | ICD-10-CM | POA: Diagnosis not present

## 2019-12-05 DIAGNOSIS — R1310 Dysphagia, unspecified: Secondary | ICD-10-CM | POA: Diagnosis not present

## 2019-12-05 DIAGNOSIS — N189 Chronic kidney disease, unspecified: Secondary | ICD-10-CM | POA: Diagnosis not present

## 2019-12-05 DIAGNOSIS — K219 Gastro-esophageal reflux disease without esophagitis: Secondary | ICD-10-CM | POA: Diagnosis not present

## 2019-12-05 DIAGNOSIS — K208 Other esophagitis without bleeding: Secondary | ICD-10-CM | POA: Diagnosis not present

## 2019-12-05 DIAGNOSIS — K2270 Barrett's esophagus without dysplasia: Secondary | ICD-10-CM | POA: Diagnosis not present

## 2019-12-05 DIAGNOSIS — Z79899 Other long term (current) drug therapy: Secondary | ICD-10-CM | POA: Diagnosis not present

## 2019-12-05 DIAGNOSIS — K229 Disease of esophagus, unspecified: Secondary | ICD-10-CM | POA: Diagnosis not present

## 2019-12-05 DIAGNOSIS — K222 Esophageal obstruction: Secondary | ICD-10-CM | POA: Diagnosis not present

## 2019-12-23 DIAGNOSIS — E785 Hyperlipidemia, unspecified: Secondary | ICD-10-CM | POA: Diagnosis not present

## 2019-12-23 DIAGNOSIS — I129 Hypertensive chronic kidney disease with stage 1 through stage 4 chronic kidney disease, or unspecified chronic kidney disease: Secondary | ICD-10-CM | POA: Diagnosis not present

## 2019-12-23 DIAGNOSIS — N183 Chronic kidney disease, stage 3 unspecified: Secondary | ICD-10-CM | POA: Diagnosis not present

## 2019-12-23 DIAGNOSIS — E1122 Type 2 diabetes mellitus with diabetic chronic kidney disease: Secondary | ICD-10-CM | POA: Diagnosis not present

## 2020-01-12 DIAGNOSIS — N183 Chronic kidney disease, stage 3 unspecified: Secondary | ICD-10-CM | POA: Diagnosis not present

## 2020-01-22 DIAGNOSIS — I10 Essential (primary) hypertension: Secondary | ICD-10-CM | POA: Diagnosis not present

## 2020-01-22 DIAGNOSIS — E1165 Type 2 diabetes mellitus with hyperglycemia: Secondary | ICD-10-CM | POA: Diagnosis not present

## 2020-01-22 DIAGNOSIS — K219 Gastro-esophageal reflux disease without esophagitis: Secondary | ICD-10-CM | POA: Diagnosis not present

## 2020-01-29 DIAGNOSIS — Z6835 Body mass index (BMI) 35.0-35.9, adult: Secondary | ICD-10-CM | POA: Diagnosis not present

## 2020-01-29 DIAGNOSIS — Z0000 Encounter for general adult medical examination without abnormal findings: Secondary | ICD-10-CM | POA: Diagnosis not present

## 2020-01-29 DIAGNOSIS — R413 Other amnesia: Secondary | ICD-10-CM | POA: Diagnosis not present

## 2020-01-29 DIAGNOSIS — M810 Age-related osteoporosis without current pathological fracture: Secondary | ICD-10-CM | POA: Diagnosis not present

## 2020-01-29 DIAGNOSIS — R2689 Other abnormalities of gait and mobility: Secondary | ICD-10-CM | POA: Diagnosis not present

## 2020-01-29 DIAGNOSIS — E1165 Type 2 diabetes mellitus with hyperglycemia: Secondary | ICD-10-CM | POA: Diagnosis not present

## 2020-02-12 DIAGNOSIS — R928 Other abnormal and inconclusive findings on diagnostic imaging of breast: Secondary | ICD-10-CM | POA: Diagnosis not present

## 2020-02-12 DIAGNOSIS — N6489 Other specified disorders of breast: Secondary | ICD-10-CM | POA: Diagnosis not present

## 2020-02-22 DIAGNOSIS — N183 Chronic kidney disease, stage 3 unspecified: Secondary | ICD-10-CM | POA: Diagnosis not present

## 2020-02-22 DIAGNOSIS — E1122 Type 2 diabetes mellitus with diabetic chronic kidney disease: Secondary | ICD-10-CM | POA: Diagnosis not present

## 2020-02-22 DIAGNOSIS — I129 Hypertensive chronic kidney disease with stage 1 through stage 4 chronic kidney disease, or unspecified chronic kidney disease: Secondary | ICD-10-CM | POA: Diagnosis not present

## 2020-02-22 DIAGNOSIS — E785 Hyperlipidemia, unspecified: Secondary | ICD-10-CM | POA: Diagnosis not present

## 2020-03-09 DIAGNOSIS — N183 Chronic kidney disease, stage 3 unspecified: Secondary | ICD-10-CM | POA: Diagnosis not present

## 2020-03-09 DIAGNOSIS — E559 Vitamin D deficiency, unspecified: Secondary | ICD-10-CM | POA: Diagnosis not present

## 2020-03-09 DIAGNOSIS — E1122 Type 2 diabetes mellitus with diabetic chronic kidney disease: Secondary | ICD-10-CM | POA: Diagnosis not present

## 2020-03-09 DIAGNOSIS — I129 Hypertensive chronic kidney disease with stage 1 through stage 4 chronic kidney disease, or unspecified chronic kidney disease: Secondary | ICD-10-CM | POA: Diagnosis not present

## 2020-03-09 DIAGNOSIS — E889 Metabolic disorder, unspecified: Secondary | ICD-10-CM | POA: Diagnosis not present

## 2020-03-09 DIAGNOSIS — M9080 Osteopathy in diseases classified elsewhere, unspecified site: Secondary | ICD-10-CM | POA: Diagnosis not present

## 2020-03-22 DIAGNOSIS — E785 Hyperlipidemia, unspecified: Secondary | ICD-10-CM | POA: Diagnosis not present

## 2020-03-22 DIAGNOSIS — I129 Hypertensive chronic kidney disease with stage 1 through stage 4 chronic kidney disease, or unspecified chronic kidney disease: Secondary | ICD-10-CM | POA: Diagnosis not present

## 2020-03-22 DIAGNOSIS — N183 Chronic kidney disease, stage 3 unspecified: Secondary | ICD-10-CM | POA: Diagnosis not present

## 2020-03-22 DIAGNOSIS — E1122 Type 2 diabetes mellitus with diabetic chronic kidney disease: Secondary | ICD-10-CM | POA: Diagnosis not present

## 2020-03-22 DIAGNOSIS — R413 Other amnesia: Secondary | ICD-10-CM | POA: Diagnosis not present

## 2020-03-30 DIAGNOSIS — R413 Other amnesia: Secondary | ICD-10-CM | POA: Diagnosis not present

## 2020-03-30 DIAGNOSIS — G319 Degenerative disease of nervous system, unspecified: Secondary | ICD-10-CM | POA: Diagnosis not present

## 2020-04-21 DIAGNOSIS — E1122 Type 2 diabetes mellitus with diabetic chronic kidney disease: Secondary | ICD-10-CM | POA: Diagnosis not present

## 2020-04-21 DIAGNOSIS — N183 Chronic kidney disease, stage 3 unspecified: Secondary | ICD-10-CM | POA: Diagnosis not present

## 2020-04-21 DIAGNOSIS — I129 Hypertensive chronic kidney disease with stage 1 through stage 4 chronic kidney disease, or unspecified chronic kidney disease: Secondary | ICD-10-CM | POA: Diagnosis not present

## 2020-04-21 DIAGNOSIS — E785 Hyperlipidemia, unspecified: Secondary | ICD-10-CM | POA: Diagnosis not present

## 2020-05-22 DIAGNOSIS — I129 Hypertensive chronic kidney disease with stage 1 through stage 4 chronic kidney disease, or unspecified chronic kidney disease: Secondary | ICD-10-CM | POA: Diagnosis not present

## 2020-05-22 DIAGNOSIS — E785 Hyperlipidemia, unspecified: Secondary | ICD-10-CM | POA: Diagnosis not present

## 2020-05-22 DIAGNOSIS — N183 Chronic kidney disease, stage 3 unspecified: Secondary | ICD-10-CM | POA: Diagnosis not present

## 2020-05-22 DIAGNOSIS — E1122 Type 2 diabetes mellitus with diabetic chronic kidney disease: Secondary | ICD-10-CM | POA: Diagnosis not present

## 2020-05-26 DIAGNOSIS — M5416 Radiculopathy, lumbar region: Secondary | ICD-10-CM | POA: Diagnosis not present

## 2020-05-26 DIAGNOSIS — R413 Other amnesia: Secondary | ICD-10-CM | POA: Diagnosis not present

## 2020-05-26 DIAGNOSIS — M545 Low back pain, unspecified: Secondary | ICD-10-CM | POA: Diagnosis not present

## 2020-05-27 DIAGNOSIS — E1165 Type 2 diabetes mellitus with hyperglycemia: Secondary | ICD-10-CM | POA: Diagnosis not present

## 2020-06-11 DIAGNOSIS — N1832 Chronic kidney disease, stage 3b: Secondary | ICD-10-CM | POA: Diagnosis not present

## 2020-06-11 DIAGNOSIS — E1122 Type 2 diabetes mellitus with diabetic chronic kidney disease: Secondary | ICD-10-CM | POA: Diagnosis not present

## 2020-06-11 DIAGNOSIS — E042 Nontoxic multinodular goiter: Secondary | ICD-10-CM | POA: Diagnosis not present

## 2020-06-11 DIAGNOSIS — I129 Hypertensive chronic kidney disease with stage 1 through stage 4 chronic kidney disease, or unspecified chronic kidney disease: Secondary | ICD-10-CM | POA: Diagnosis not present

## 2020-06-21 DIAGNOSIS — E785 Hyperlipidemia, unspecified: Secondary | ICD-10-CM | POA: Diagnosis not present

## 2020-06-21 DIAGNOSIS — N183 Chronic kidney disease, stage 3 unspecified: Secondary | ICD-10-CM | POA: Diagnosis not present

## 2020-06-21 DIAGNOSIS — I129 Hypertensive chronic kidney disease with stage 1 through stage 4 chronic kidney disease, or unspecified chronic kidney disease: Secondary | ICD-10-CM | POA: Diagnosis not present

## 2020-06-21 DIAGNOSIS — E1122 Type 2 diabetes mellitus with diabetic chronic kidney disease: Secondary | ICD-10-CM | POA: Diagnosis not present

## 2020-07-22 DIAGNOSIS — E1122 Type 2 diabetes mellitus with diabetic chronic kidney disease: Secondary | ICD-10-CM | POA: Diagnosis not present

## 2020-07-22 DIAGNOSIS — N183 Chronic kidney disease, stage 3 unspecified: Secondary | ICD-10-CM | POA: Diagnosis not present

## 2020-07-22 DIAGNOSIS — I129 Hypertensive chronic kidney disease with stage 1 through stage 4 chronic kidney disease, or unspecified chronic kidney disease: Secondary | ICD-10-CM | POA: Diagnosis not present

## 2020-07-22 DIAGNOSIS — E785 Hyperlipidemia, unspecified: Secondary | ICD-10-CM | POA: Diagnosis not present

## 2020-08-09 DIAGNOSIS — M1712 Unilateral primary osteoarthritis, left knee: Secondary | ICD-10-CM | POA: Diagnosis not present

## 2020-08-09 DIAGNOSIS — M170 Bilateral primary osteoarthritis of knee: Secondary | ICD-10-CM | POA: Diagnosis not present

## 2020-08-09 DIAGNOSIS — M1711 Unilateral primary osteoarthritis, right knee: Secondary | ICD-10-CM | POA: Diagnosis not present

## 2020-08-18 DIAGNOSIS — M170 Bilateral primary osteoarthritis of knee: Secondary | ICD-10-CM | POA: Diagnosis not present

## 2020-08-22 DIAGNOSIS — E785 Hyperlipidemia, unspecified: Secondary | ICD-10-CM | POA: Diagnosis not present

## 2020-08-22 DIAGNOSIS — N183 Chronic kidney disease, stage 3 unspecified: Secondary | ICD-10-CM | POA: Diagnosis not present

## 2020-08-22 DIAGNOSIS — E1122 Type 2 diabetes mellitus with diabetic chronic kidney disease: Secondary | ICD-10-CM | POA: Diagnosis not present

## 2020-08-22 DIAGNOSIS — I129 Hypertensive chronic kidney disease with stage 1 through stage 4 chronic kidney disease, or unspecified chronic kidney disease: Secondary | ICD-10-CM | POA: Diagnosis not present

## 2020-08-25 DIAGNOSIS — M170 Bilateral primary osteoarthritis of knee: Secondary | ICD-10-CM | POA: Diagnosis not present

## 2020-09-01 DIAGNOSIS — M170 Bilateral primary osteoarthritis of knee: Secondary | ICD-10-CM | POA: Diagnosis not present

## 2020-09-22 DIAGNOSIS — E1169 Type 2 diabetes mellitus with other specified complication: Secondary | ICD-10-CM | POA: Diagnosis not present

## 2020-09-22 DIAGNOSIS — Z6833 Body mass index (BMI) 33.0-33.9, adult: Secondary | ICD-10-CM | POA: Diagnosis not present

## 2020-09-22 DIAGNOSIS — M810 Age-related osteoporosis without current pathological fracture: Secondary | ICD-10-CM | POA: Diagnosis not present

## 2020-09-22 DIAGNOSIS — E785 Hyperlipidemia, unspecified: Secondary | ICD-10-CM | POA: Diagnosis not present

## 2020-09-22 DIAGNOSIS — Z79899 Other long term (current) drug therapy: Secondary | ICD-10-CM | POA: Diagnosis not present

## 2020-09-22 DIAGNOSIS — N183 Chronic kidney disease, stage 3 unspecified: Secondary | ICD-10-CM | POA: Diagnosis not present

## 2020-09-22 DIAGNOSIS — E1122 Type 2 diabetes mellitus with diabetic chronic kidney disease: Secondary | ICD-10-CM | POA: Diagnosis not present

## 2020-09-22 DIAGNOSIS — I129 Hypertensive chronic kidney disease with stage 1 through stage 4 chronic kidney disease, or unspecified chronic kidney disease: Secondary | ICD-10-CM | POA: Diagnosis not present

## 2020-09-22 DIAGNOSIS — E7800 Pure hypercholesterolemia, unspecified: Secondary | ICD-10-CM | POA: Diagnosis not present

## 2020-10-13 DIAGNOSIS — E119 Type 2 diabetes mellitus without complications: Secondary | ICD-10-CM | POA: Diagnosis not present

## 2020-10-13 DIAGNOSIS — E042 Nontoxic multinodular goiter: Secondary | ICD-10-CM | POA: Diagnosis not present

## 2020-10-13 DIAGNOSIS — Z79899 Other long term (current) drug therapy: Secondary | ICD-10-CM | POA: Diagnosis not present

## 2020-10-13 DIAGNOSIS — I10 Essential (primary) hypertension: Secondary | ICD-10-CM | POA: Diagnosis not present

## 2020-10-27 DIAGNOSIS — E042 Nontoxic multinodular goiter: Secondary | ICD-10-CM | POA: Diagnosis not present

## 2020-11-22 DIAGNOSIS — E7800 Pure hypercholesterolemia, unspecified: Secondary | ICD-10-CM | POA: Diagnosis not present

## 2020-11-22 DIAGNOSIS — I10 Essential (primary) hypertension: Secondary | ICD-10-CM | POA: Diagnosis not present

## 2020-11-22 DIAGNOSIS — E1169 Type 2 diabetes mellitus with other specified complication: Secondary | ICD-10-CM | POA: Diagnosis not present

## 2020-12-22 DIAGNOSIS — M810 Age-related osteoporosis without current pathological fracture: Secondary | ICD-10-CM | POA: Diagnosis not present

## 2020-12-22 DIAGNOSIS — I10 Essential (primary) hypertension: Secondary | ICD-10-CM | POA: Diagnosis not present

## 2020-12-22 DIAGNOSIS — E1169 Type 2 diabetes mellitus with other specified complication: Secondary | ICD-10-CM | POA: Diagnosis not present

## 2020-12-24 DIAGNOSIS — N1832 Chronic kidney disease, stage 3b: Secondary | ICD-10-CM | POA: Diagnosis not present

## 2020-12-29 DIAGNOSIS — N183 Chronic kidney disease, stage 3 unspecified: Secondary | ICD-10-CM | POA: Diagnosis not present

## 2020-12-29 DIAGNOSIS — M7989 Other specified soft tissue disorders: Secondary | ICD-10-CM | POA: Diagnosis not present

## 2020-12-29 DIAGNOSIS — I129 Hypertensive chronic kidney disease with stage 1 through stage 4 chronic kidney disease, or unspecified chronic kidney disease: Secondary | ICD-10-CM | POA: Diagnosis not present

## 2020-12-29 DIAGNOSIS — E559 Vitamin D deficiency, unspecified: Secondary | ICD-10-CM | POA: Diagnosis not present

## 2020-12-29 DIAGNOSIS — E1122 Type 2 diabetes mellitus with diabetic chronic kidney disease: Secondary | ICD-10-CM | POA: Diagnosis not present

## 2020-12-29 DIAGNOSIS — R8281 Pyuria: Secondary | ICD-10-CM | POA: Diagnosis not present

## 2020-12-29 DIAGNOSIS — N1832 Chronic kidney disease, stage 3b: Secondary | ICD-10-CM | POA: Diagnosis not present

## 2020-12-29 DIAGNOSIS — M898X9 Other specified disorders of bone, unspecified site: Secondary | ICD-10-CM | POA: Diagnosis not present

## 2021-01-21 DIAGNOSIS — M810 Age-related osteoporosis without current pathological fracture: Secondary | ICD-10-CM | POA: Diagnosis not present

## 2021-01-21 DIAGNOSIS — E1169 Type 2 diabetes mellitus with other specified complication: Secondary | ICD-10-CM | POA: Diagnosis not present

## 2021-01-21 DIAGNOSIS — I10 Essential (primary) hypertension: Secondary | ICD-10-CM | POA: Diagnosis not present

## 2021-01-24 DIAGNOSIS — R2231 Localized swelling, mass and lump, right upper limb: Secondary | ICD-10-CM | POA: Diagnosis not present

## 2021-03-09 DIAGNOSIS — R928 Other abnormal and inconclusive findings on diagnostic imaging of breast: Secondary | ICD-10-CM | POA: Diagnosis not present

## 2021-03-09 DIAGNOSIS — N6489 Other specified disorders of breast: Secondary | ICD-10-CM | POA: Diagnosis not present

## 2021-03-18 DIAGNOSIS — L03039 Cellulitis of unspecified toe: Secondary | ICD-10-CM | POA: Diagnosis not present

## 2021-03-18 DIAGNOSIS — Z6834 Body mass index (BMI) 34.0-34.9, adult: Secondary | ICD-10-CM | POA: Diagnosis not present

## 2021-03-22 DIAGNOSIS — N1832 Chronic kidney disease, stage 3b: Secondary | ICD-10-CM | POA: Diagnosis not present

## 2021-03-22 DIAGNOSIS — I10 Essential (primary) hypertension: Secondary | ICD-10-CM | POA: Diagnosis not present

## 2021-03-22 DIAGNOSIS — E1169 Type 2 diabetes mellitus with other specified complication: Secondary | ICD-10-CM | POA: Diagnosis not present

## 2021-04-12 DIAGNOSIS — Z7985 Long-term (current) use of injectable non-insulin antidiabetic drugs: Secondary | ICD-10-CM | POA: Diagnosis not present

## 2021-04-12 DIAGNOSIS — I10 Essential (primary) hypertension: Secondary | ICD-10-CM | POA: Diagnosis not present

## 2021-04-12 DIAGNOSIS — E119 Type 2 diabetes mellitus without complications: Secondary | ICD-10-CM | POA: Diagnosis not present

## 2021-04-12 DIAGNOSIS — E042 Nontoxic multinodular goiter: Secondary | ICD-10-CM | POA: Diagnosis not present

## 2021-04-12 DIAGNOSIS — Z7984 Long term (current) use of oral hypoglycemic drugs: Secondary | ICD-10-CM | POA: Diagnosis not present

## 2021-04-22 DIAGNOSIS — I10 Essential (primary) hypertension: Secondary | ICD-10-CM | POA: Diagnosis not present

## 2021-04-22 DIAGNOSIS — E782 Mixed hyperlipidemia: Secondary | ICD-10-CM | POA: Diagnosis not present

## 2021-04-25 DIAGNOSIS — N1832 Chronic kidney disease, stage 3b: Secondary | ICD-10-CM | POA: Diagnosis not present

## 2021-04-25 DIAGNOSIS — E1122 Type 2 diabetes mellitus with diabetic chronic kidney disease: Secondary | ICD-10-CM | POA: Diagnosis not present

## 2021-04-25 DIAGNOSIS — L609 Nail disorder, unspecified: Secondary | ICD-10-CM | POA: Diagnosis not present

## 2021-04-25 DIAGNOSIS — L03031 Cellulitis of right toe: Secondary | ICD-10-CM | POA: Diagnosis not present

## 2021-04-28 DIAGNOSIS — E113293 Type 2 diabetes mellitus with mild nonproliferative diabetic retinopathy without macular edema, bilateral: Secondary | ICD-10-CM | POA: Diagnosis not present

## 2021-05-20 DIAGNOSIS — E1169 Type 2 diabetes mellitus with other specified complication: Secondary | ICD-10-CM | POA: Diagnosis not present

## 2021-05-20 DIAGNOSIS — Z6834 Body mass index (BMI) 34.0-34.9, adult: Secondary | ICD-10-CM | POA: Diagnosis not present

## 2021-05-20 DIAGNOSIS — L600 Ingrowing nail: Secondary | ICD-10-CM | POA: Diagnosis not present

## 2021-05-20 DIAGNOSIS — B351 Tinea unguium: Secondary | ICD-10-CM | POA: Diagnosis not present

## 2021-05-25 DIAGNOSIS — N1832 Chronic kidney disease, stage 3b: Secondary | ICD-10-CM | POA: Diagnosis not present

## 2021-05-25 DIAGNOSIS — M79674 Pain in right toe(s): Secondary | ICD-10-CM | POA: Diagnosis not present

## 2021-05-25 DIAGNOSIS — E1122 Type 2 diabetes mellitus with diabetic chronic kidney disease: Secondary | ICD-10-CM | POA: Diagnosis not present

## 2021-05-25 DIAGNOSIS — L02611 Cutaneous abscess of right foot: Secondary | ICD-10-CM | POA: Diagnosis not present

## 2021-05-27 DIAGNOSIS — L02611 Cutaneous abscess of right foot: Secondary | ICD-10-CM | POA: Diagnosis not present

## 2021-05-27 DIAGNOSIS — N1832 Chronic kidney disease, stage 3b: Secondary | ICD-10-CM | POA: Diagnosis not present

## 2021-05-27 DIAGNOSIS — M79674 Pain in right toe(s): Secondary | ICD-10-CM | POA: Diagnosis not present

## 2021-05-27 DIAGNOSIS — E1122 Type 2 diabetes mellitus with diabetic chronic kidney disease: Secondary | ICD-10-CM | POA: Diagnosis not present

## 2021-06-08 DIAGNOSIS — N1832 Chronic kidney disease, stage 3b: Secondary | ICD-10-CM | POA: Diagnosis not present

## 2021-06-08 DIAGNOSIS — L089 Local infection of the skin and subcutaneous tissue, unspecified: Secondary | ICD-10-CM | POA: Diagnosis not present

## 2021-06-08 DIAGNOSIS — E1122 Type 2 diabetes mellitus with diabetic chronic kidney disease: Secondary | ICD-10-CM | POA: Diagnosis not present

## 2021-06-08 DIAGNOSIS — M79674 Pain in right toe(s): Secondary | ICD-10-CM | POA: Diagnosis not present

## 2021-06-22 DIAGNOSIS — E1169 Type 2 diabetes mellitus with other specified complication: Secondary | ICD-10-CM | POA: Diagnosis not present

## 2021-06-22 DIAGNOSIS — E7800 Pure hypercholesterolemia, unspecified: Secondary | ICD-10-CM | POA: Diagnosis not present

## 2021-06-22 DIAGNOSIS — Z1331 Encounter for screening for depression: Secondary | ICD-10-CM | POA: Diagnosis not present

## 2021-06-22 DIAGNOSIS — N1832 Chronic kidney disease, stage 3b: Secondary | ICD-10-CM | POA: Diagnosis not present

## 2021-06-22 DIAGNOSIS — Z6834 Body mass index (BMI) 34.0-34.9, adult: Secondary | ICD-10-CM | POA: Diagnosis not present

## 2021-06-22 DIAGNOSIS — Z0000 Encounter for general adult medical examination without abnormal findings: Secondary | ICD-10-CM | POA: Diagnosis not present

## 2021-06-22 DIAGNOSIS — I10 Essential (primary) hypertension: Secondary | ICD-10-CM | POA: Diagnosis not present

## 2021-06-29 DIAGNOSIS — E559 Vitamin D deficiency, unspecified: Secondary | ICD-10-CM | POA: Diagnosis not present

## 2021-06-29 DIAGNOSIS — N179 Acute kidney failure, unspecified: Secondary | ICD-10-CM | POA: Diagnosis not present

## 2021-06-29 DIAGNOSIS — R7989 Other specified abnormal findings of blood chemistry: Secondary | ICD-10-CM | POA: Diagnosis not present

## 2021-06-29 DIAGNOSIS — E1122 Type 2 diabetes mellitus with diabetic chronic kidney disease: Secondary | ICD-10-CM | POA: Diagnosis not present

## 2021-06-29 DIAGNOSIS — M898X9 Other specified disorders of bone, unspecified site: Secondary | ICD-10-CM | POA: Diagnosis not present

## 2021-06-29 DIAGNOSIS — N1832 Chronic kidney disease, stage 3b: Secondary | ICD-10-CM | POA: Diagnosis not present

## 2021-07-20 DIAGNOSIS — E559 Vitamin D deficiency, unspecified: Secondary | ICD-10-CM | POA: Diagnosis not present

## 2021-07-20 DIAGNOSIS — I10 Essential (primary) hypertension: Secondary | ICD-10-CM | POA: Diagnosis not present

## 2021-07-20 DIAGNOSIS — E119 Type 2 diabetes mellitus without complications: Secondary | ICD-10-CM | POA: Diagnosis not present

## 2021-07-20 DIAGNOSIS — Z7984 Long term (current) use of oral hypoglycemic drugs: Secondary | ICD-10-CM | POA: Diagnosis not present

## 2021-07-20 DIAGNOSIS — Z7985 Long-term (current) use of injectable non-insulin antidiabetic drugs: Secondary | ICD-10-CM | POA: Diagnosis not present

## 2021-08-16 DIAGNOSIS — M1712 Unilateral primary osteoarthritis, left knee: Secondary | ICD-10-CM | POA: Diagnosis not present

## 2021-08-16 DIAGNOSIS — M25561 Pain in right knee: Secondary | ICD-10-CM | POA: Diagnosis not present

## 2021-08-16 DIAGNOSIS — M25562 Pain in left knee: Secondary | ICD-10-CM | POA: Diagnosis not present

## 2021-09-22 DIAGNOSIS — I10 Essential (primary) hypertension: Secondary | ICD-10-CM | POA: Diagnosis not present

## 2021-09-22 DIAGNOSIS — E7800 Pure hypercholesterolemia, unspecified: Secondary | ICD-10-CM | POA: Diagnosis not present

## 2021-09-22 DIAGNOSIS — E1169 Type 2 diabetes mellitus with other specified complication: Secondary | ICD-10-CM | POA: Diagnosis not present

## 2021-09-29 NOTE — H&P (Signed)
Patient's anticipated LOS is less than 2 midnights, meeting these requirements: - Younger than 9 - Lives within 1 hour of care - Has a competent adult at home to recover with post-op recover - NO history of  - Chronic pain requiring opiods  - Diabetes  - Coronary Artery Disease  - Heart failure  - Heart attack  - Stroke  - DVT/VTE  - Cardiac arrhythmia  - Respiratory Failure/COPD  - Renal failure  - Anemia  - Advanced Liver disease     Mayla Biddy is an 76 y.o. female.    Chief Complaint: left knee pain  HPI: Pt is a 76 y.o. female complaining of left knee pain for multiple years. Pain had continually increased since the beginning. X-rays in the clinic show end-stage arthritic changes of the left knee. Pt has tried various conservative treatments which have failed to alleviate their symptoms, including injections and therapy. Various options are discussed with the patient. Risks, benefits and expectations were discussed with the patient. Patient understand the risks, benefits and expectations and wishes to proceed with surgery.   PCP:  Ronita Hipps, MD  D/C Plans: Home  PMH: Past Medical History:  Diagnosis Date   Anxiety    Cancer ([place])    skin cancer- face-    Complication of anesthesia    Concussion    Depression    Diabetes mellitus without complication (HCC)    GERD (gastroesophageal reflux disease)    Hypertension    IBS (irritable bowel syndrome)    diarrhea   MRSA (methicillin resistant Staphylococcus aureus)    after skin cancer removed from face.   Neuropathy    PONV (postoperative nausea and vomiting)    years ago   PTSD (post-traumatic stress disorder)    Stroke [facility])    "they think a silent one"    PSH: Past Surgical History:  Procedure Laterality Date   ABDOMINAL HYSTERECTOMY     CARPAL TUNNEL RELEASE Bilateral    CATARACT EXTRACTION Bilateral    CHOLECYSTECTOMY     DILATION AND CURETTAGE OF UTERUS     LOWER EXTREMITY VENOGRAPHY  N/A 01/10/2017   Procedure: LOWER EXTREMITY ASCENDING VENOGRAPHY;  Surgeon: Waynetta Sandy, MD;  Location: [place] [facility];  Service: Cardiovascular;  Laterality: N/A;   OVARIAN CYST SURGERY     PERIPHERAL VASCULAR BALLOON ANGIOPLASTY Right 01/10/2017   Procedure: PERIPHERAL VASCULAR BALLOON ANGIOPLASTY;  Surgeon: Waynetta Sandy, MD;  Location: [place] [facility];  Service: Cardiovascular;  Laterality: Right;   ROTATOR CUFF REPAIR Bilateral    SALPINGOOPHORECTOMY Bilateral    SHOULDER ARTHROSCOPY WITH SUBACROMIAL DECOMPRESSION AND OPEN ROTATOR C Right 01/31/2013   Procedure: RIGHT SHOULDER ARTHROSCOPY WITH SUBACROMIAL DECOMPRESSION AND MINI OPEN ROTATOR CUFF REPAIR, OPEN BICEP TENDODESIS AND OPEN DISTAL CLAVICLE RESECTION;  Surgeon: Augustin Schooling, MD;  Location: [place];  Service: Orthopedics;  Laterality: Right;   SHOULDER ARTHROSCOPY WITH SUBACROMIAL DECOMPRESSION AND OPEN ROTATOR C Left 11/28/2013   Procedure: LEFT SHOULDER ARTHROSCOPY WITH SUBACROMIAL DECOMPRESSION  BICEP TENODESIS MINI OPEN ROTATOR CUFF REPAIR, OPEN DISTAL CLAVICLE RESECTION ;  Surgeon: Augustin Schooling, MD;  Location: [place];  Service: Orthopedics;  Laterality: Left;   TRIGGER FINGER RELEASE Right    4    TRIGGER FINGER RELEASE Bilateral    thumbs   TUBAL LIGATION      Social History:  reports that she has never smoked. She has never used smokeless tobacco. She reports current alcohol use. She reports  that she does not use drugs. BMI: Estimated body mass index is 37.02 kg/m as calculated from the following:   Height as of 08/31/17: $RemoveBe'5\' 3"'cqEcTusAc$  (1.6 m).   Weight as of 08/31/17: 94.8 kg.  No results found for: "ALBUMIN" Diabetes: Patient does not have a diagnosis of diabetes.     Smoking Status:   reports that she has never smoked. She has never used smokeless tobacco.    Allergies:  Allergies  Allergen Reactions   Talwin [Pentazocine] Nausea Only and Other (See Comments)    Confusion   Codeine  Nausea And Vomiting and Other (See Comments)    disorientation   Erythromycin Hives   Morphine And Related Rash   Percocet [Oxycodone-Acetaminophen] Itching    Medications: No current facility-administered medications for this encounter.   Current Outpatient Medications  Medication Sig Dispense Refill   acetaminophen (TYLENOL) 325 MG tablet Take 650 mg by mouth every 6 (six) hours as needed (for pain.).     albuterol (PROVENTIL HFA;VENTOLIN HFA) 108 (90 BASE) MCG/ACT inhaler Inhale 2 puffs into the lungs every 6 (six) hours as needed for wheezing or shortness of breath.      aspirin EC 81 MG tablet Take 162 mg by mouth daily at 3 pm.     Biotin w/ Vitamins C & E (HAIR/SKIN/NAILS PO) Take 1 tablet by mouth daily.     CALCIUM PO Take 1,000 mg by mouth 2 (two) times daily.      celecoxib (CELEBREX) 200 MG capsule Take 200 mg by mouth daily.     cetirizine (ZYRTEC) 10 MG tablet Take 10 mg by mouth daily.     Cholecalciferol (VITAMIN D3) 10000 units TABS Take 1,000 Units by mouth daily.     citalopram (CELEXA) 20 MG tablet Take 20 mg by mouth daily.     clonazePAM (KLONOPIN) 1 MG tablet Take 1 mg by mouth 2 (two) times daily.     diclofenac sodium (VOLTAREN) 1 % GEL Apply 2 g topically 4 (four) times daily as needed (for pain).     ELIQUIS 5 MG TABS tablet Take 5 mg by mouth 2 (two) times daily.     fluconazole (DIFLUCAN) 150 MG tablet Take 150 mg by mouth daily as needed (FOR YEAST INFECTIONS.).     Hydrocortisone (ZOXWRUEAV-40 EX) Apply 1 application topically 4 (four) times daily as needed (for itchy skin.).     insulin aspart (NOVOLOG) 100 UNIT/ML injection Inject 20 Units into the skin daily with supper.      Melatonin 10 MG TABS Take 10 mg by mouth at bedtime.     methocarbamol (ROBAXIN) 500 MG tablet Take 1 tablet (500 mg total) by mouth 3 (three) times daily as needed. (Patient taking differently: Take 1,000 mg by mouth at bedtime. ) 60 tablet 1   nitroGLYCERIN (NITROSTAT) 0.4 MG SL  tablet Place 0.4 mg under the tongue every 5 (five) minutes as needed for chest pain.     OIL BASE EX Apply 1 application topically 4 (four) times daily as needed (for pain.). CBD OIL     Omega-3 Fatty Acids (OMEGA-3 FISH OIL) 1200 MG CAPS Take 1,200 mg by mouth 2 (two) times daily.     pantoprazole (PROTONIX) 40 MG tablet Take 40 mg by mouth daily.     pioglitazone-metformin (ACTOPLUS MET) 15-850 MG per tablet Take 2 tablets by mouth daily.      ranitidine (ZANTAC) 150 MG tablet Take 150 mg by mouth daily as needed for heartburn.  rosuvastatin (CRESTOR) 20 MG tablet Take 20 mg by mouth daily.     valsartan-hydrochlorothiazide (DIOVAN-HCT) 320-12.5 MG tablet Take 1 tablet by mouth daily.      No results found for this or any previous visit (from the past 48 hour(s)). No results found.  ROS: Pain with rom of the left lower extremity  Physical Exam: Alert and oriented 76 y.o. female in no acute distress Cranial nerves 2-12 intact Cervical spine: full rom with no tenderness, nv intact distally Chest: active breath sounds bilaterally, no wheeze rhonchi or rales Heart: regular rate and rhythm, no murmur Abd: non tender non distended with active bowel sounds Hip is stable with rom  Left knee painful rom with crepitus Nv intact distally No rashes or edema Slow antalgic gait  Assessment/Plan Assessment: left knee end stage osteoarthritis  Plan:  Patient will undergo a left total knee by Dr. Veverly Fells at [place] Risks benefits and expectations were discussed with the patient. Patient understand risks, benefits and expectations and wishes to proceed. Preoperative templating of the joint replacement has been completed, documented, and submitted to the Operating Room personnel in order to optimize intra-operative equipment management.   Merla Riches PA-C, MPAS [facility] is now [employer] [facility] [address] [place], [place], [place] [address] Phone:  [phone] [email or web address] Facebook  [employer]

## 2021-10-11 NOTE — Progress Notes (Addendum)
Anesthesia Review:  PCP: Kennith Maes  Cardiologist : none  Endo-  Kidney-  Chest x-ray : EKG : Echo : Stress test: Cardiac Cath :  Activity level:  Sleep Study/ CPAP : Fasting Blood Sugar :      / Checks Blood Sugar -- times a day:   Blood Thinner/ Instructions /Last Dose: ASA / Instructions/ Last Dose :   81 mg aspiirin  Freestyle Libre - checks glucose 2 x daily  DM- type 2 Hgba1c- 10/14/21-6.6  Placed on pt instructions and pt aware- No Trulicity on 5/92/92.   PT with hx of right greast toe infection approx 4 months ago.  PT states DR Netta Cedars aware.  Toe is pink at top of toe on preop appt.   Hx of chest pain 30 years ago per pt seen by cardiology then but none since.   Hx of DVT - off Eliquis x approx 3 years per pt  Pt has Chronic Kidney Disease- folloed by [employer]  Husband is a double amputee at home per pt. He has Home Health Services.   Some relatives to help her after surgery per pt.   BMP done 10/14/21 rotued to Dr Netta Cedars.  PT was 20 minutes late for preop appt.

## 2021-10-12 ENCOUNTER — Ambulatory Visit: Payer: Self-pay | Admitting: Physician Assistant

## 2021-10-12 NOTE — Patient Instructions (Addendum)
SURGICAL WAITING ROOM VISITATION Patients having surgery or a procedure may have no more than 2 support people in the waiting area - these visitors may rotate.   Children under the age of 90 must have an adult with them who is not the patient. If the patient needs to stay at the hospital during part of their recovery, the visitor guidelines for inpatient rooms apply. Pre-op nurse will coordinate an appropriate time for 1 support person to accompany patient in pre-op.  This support person may not rotate.    Please refer to the [facility] website for the visitor guidelines for Inpatients (after your surgery is over and you are in a regular room).       Your procedure is scheduled on:  10/21/21    Report to [facility] Main Entrance    Report to admitting at   [phone]   Call this number if you have problems the morning of surgery [phone]   Do not eat food :After Midnight.   After Midnight you may have the following liquids until __ 0430____ AM DAY OF SURGERY  Water Non-Citrus Juices (without pulp, NO RED) Carbonated Beverages Black Coffee (NO MILK/CREAM OR CREAMERS, sugar ok)  Clear Tea (NO MILK/CREAM OR CREAMERS, sugar ok) regular and decaf                             Plain Jell-O (NO RED)                                           Fruit ices (not with fruit pulp, NO RED)                                     Popsicles (NO RED)                                                               Sports drinks like Gatorade (NO RED)       The day of surgery:  Drink ONE (1) Pre-Surgery Clear Ensure or G2 at  0430 AM  ( have completed by ) the morning of surgery. Drink in one sitting. Do not sip.  This drink was given to you during your hospital  pre-op appointment visit. Nothing else to drink after completing the  Pre-Surgery Clear Ensure or G2.          If you have questions, please contact your surgeon's office.     Oral Hygiene is also important to reduce your risk of  infection.                                    Remember - BRUSH YOUR TEETH THE MORNING OF SURGERY WITH YOUR REGULAR TOOTHPASTE   Do NOT smoke after Midnight   Take these medicines the morning of surgery with A SIP OF WATER:  inhalers as usual and bring, welbutrin , zyrtec, celexa, clonazepam, gabapentin, omeprazole    DO NOT TAKE ANY ORAL DIABETIC  MEDICATIONS DAY OF YOUR SURGERY  Bring CPAP mask and tubing day of surgery.                              You may not have any metal on your body including hair pins, jewelry, and body piercing             Do not wear make-up, lotions, powders, perfumes/cologne, or deodorant  Do not wear nail polish including gel and S&S, artificial/acrylic nails, or any other type of covering on natural nails including finger and toenails. If you have artificial nails, gel coating, etc. that needs to be removed by a nail salon please have this removed prior to surgery or surgery may need to be canceled/ delayed if the surgeon/ anesthesia feels like they are unable to be safely monitored.   Do not shave  48 hours prior to surgery.               Men may shave face and neck.   Do not bring valuables to the hospital. [place].   Contacts, dentures or bridgework may not be worn into surgery.   Bring small overnight bag day of surgery.   DO NOT [place]. PHARMACY WILL DISPENSE MEDICATIONS LISTED ON YOUR MEDICATION LIST TO YOU DURING YOUR ADMISSION [place]!    Patients discharged on the day of surgery will not be allowed to drive home.  Someone NEEDS to stay with you for the first 24 hours after anesthesia.   Special Instructions: Bring a copy of your healthcare power of attorney and living will documents the day of surgery if you haven't scanned them before.              Please read over the following fact sheets you were given: IF [place] [phone]   If you received a COVID test during your pre-op visit  it is requested that you wear a mask when out in public, stay away from anyone that may not be feeling well and notify your surgeon if you develop symptoms. If you test positive for Covid or have been in contact with anyone that has tested positive in the last 10 days please notify you surgeon.     [place] - Preparing for Surgery Before surgery, you can play an important role.  Because skin is not sterile, your skin needs to be as free of germs as possible.  You can reduce the number of germs on your skin by washing with CHG (chlorahexidine gluconate) soap before surgery.  CHG is an antiseptic cleaner which kills germs and bonds with the skin to continue killing germs even after washing. Please DO NOT use if you have an allergy to CHG or antibacterial soaps.  If your skin becomes reddened/irritated stop using the CHG and inform your nurse when you arrive at Short Stay. Do not shave (including legs and underarms) for at least 48 hours prior to the first CHG shower.  You may shave your face/neck. Please follow these instructions carefully:  1.  Shower with CHG Soap the night before surgery and the  morning of Surgery.  2.  If you choose to wash your hair, wash your hair first as usual with your  normal  shampoo.  3.  After you shampoo, rinse your hair and body thoroughly to remove the  shampoo.                           4.  Use CHG as you would any other liquid soap.  You can apply chg directly  to the skin and wash                       Gently with a scrungie or clean washcloth.  5.  Apply the CHG Soap to your body ONLY FROM THE NECK DOWN.   Do not use on face/ open                           Wound or open sores. Avoid contact with eyes, ears mouth and genitals (private parts).                       Wash face,  Genitals (private parts) with your normal soap.             6.  Wash thoroughly, paying  special attention to the area where your surgery  will be performed.  7.  Thoroughly rinse your body with warm water from the neck down.  8.  DO NOT shower/wash with your normal soap after using and rinsing off  the CHG Soap.                9.  Pat yourself dry with a clean towel.            10.  Wear clean pajamas.            11.  Place clean sheets on your bed the night of your first shower and do not  sleep with pets. Day of Surgery : Do not apply any lotions/deodorants the morning of surgery.  Please wear clean clothes to the hospital/surgery center.  FAILURE TO FOLLOW THESE INSTRUCTIONS MAY RESULT IN THE CANCELLATION OF YOUR SURGERY PATIENT SIGNATURE_________________________________  NURSE SIGNATURE__________________________________  ________________________________________________________________________

## 2021-10-14 ENCOUNTER — Other Ambulatory Visit: Payer: Self-pay

## 2021-10-14 ENCOUNTER — Encounter (HOSPITAL_COMMUNITY): Payer: Self-pay

## 2021-10-14 ENCOUNTER — Encounter (HOSPITAL_COMMUNITY)
Admission: RE | Admit: 2021-10-14 | Discharge: 2021-10-14 | Disposition: A | Discharge status: Home / Self Care | Payer: PPO | Source: Ambulatory Visit | Attending: Orthopedic Surgery | Admitting: Orthopedic Surgery

## 2021-10-14 VITALS — BP 138/60 | HR 61 | Temp 98.3°F | Resp 16 | Ht 62.0 in | Wt 188.0 lb

## 2021-10-14 DIAGNOSIS — Z01818 Encounter for other preprocedural examination: Secondary | ICD-10-CM | POA: Insufficient documentation

## 2021-10-14 DIAGNOSIS — E118 Type 2 diabetes mellitus with unspecified complications: Secondary | ICD-10-CM | POA: Diagnosis not present

## 2021-10-14 HISTORY — DX: Chronic kidney disease, unspecified: N18.9

## 2021-10-14 HISTORY — DX: Pneumonia, unspecified organism: J18.9

## 2021-10-14 HISTORY — DX: Unspecified asthma, uncomplicated: J45.909

## 2021-10-14 HISTORY — DX: Unspecified osteoarthritis, unspecified site: M19.90

## 2021-10-14 LAB — CBC
HCT: 37.9 % (ref 36.0–46.0)
Hemoglobin: 11.9 g/dL — ABNORMAL LOW (ref 12.0–15.0)
MCH: 29.9 pg (ref 26.0–34.0)
MCHC: 31.4 g/dL (ref 30.0–36.0)
MCV: 95.2 fL (ref 80.0–100.0)
Platelets: 166 10*3/uL (ref 150–400)
RBC: 3.98 MIL/uL (ref 3.87–5.11)
RDW: 13.7 % (ref 11.5–15.5)
WBC: 4.7 10*3/uL (ref 4.0–10.5)
nRBC: 0 % (ref 0.0–0.2)

## 2021-10-14 LAB — BASIC METABOLIC PANEL
Anion gap: 10 (ref 5–15)
BUN: 23 mg/dL (ref 8–23)
CO2: 23 mmol/L (ref 22–32)
Calcium: 9.2 mg/dL (ref 8.9–10.3)
Chloride: 105 mmol/L (ref 98–111)
Creatinine, Ser: 1.55 mg/dL — ABNORMAL HIGH (ref 0.44–1.00)
GFR, Estimated: 35 mL/min — ABNORMAL LOW (ref >60)
Glucose, Bld: 124 mg/dL — ABNORMAL HIGH (ref 70–99)
Potassium: 4.5 mmol/L (ref 3.5–5.1)
Sodium: 138 mmol/L (ref 135–145)

## 2021-10-14 LAB — HEMOGLOBIN A1C
Hgb A1c MFr Bld: 6.6 % — ABNORMAL HIGH (ref 4.8–5.6)
Mean Plasma Glucose: 142.72 mg/dL

## 2021-10-14 LAB — GLUCOSE, CAPILLARY: Glucose-Capillary: 111 mg/dL — ABNORMAL HIGH (ref 70–99)

## 2021-10-14 LAB — SURGICAL PCR SCREEN
MRSA, PCR: NEGATIVE
Staphylococcus aureus: NEGATIVE

## 2021-10-14 LAB — NO BLOOD PRODUCTS

## 2021-10-14 NOTE — Progress Notes (Addendum)
PT is a Jehovah Witness Consent form faxed to [facility] Confirmation received.  Consent form faxed to  Dr Netta Cedars along iwith Advance directive with confirmation received Placed in allergies.  Placed in [place] Directive on front of chart  ORder placed in epic.

## 2021-10-21 ENCOUNTER — Ambulatory Visit (HOSPITAL_COMMUNITY): Payer: PPO | Admitting: Physician Assistant

## 2021-10-21 ENCOUNTER — Inpatient Hospital Stay (HOSPITAL_COMMUNITY)
Admission: AD | Admit: 2021-10-21 | Discharge: 2021-10-28 | DRG: 470 | Disposition: A | Discharge status: Skilled Nursing Facility | Payer: PPO | Attending: Orthopedic Surgery | Admitting: Orthopedic Surgery

## 2021-10-21 ENCOUNTER — Encounter (HOSPITAL_COMMUNITY): Payer: Self-pay | Admitting: Orthopedic Surgery

## 2021-10-21 ENCOUNTER — Other Ambulatory Visit: Payer: Self-pay

## 2021-10-21 ENCOUNTER — Encounter (HOSPITAL_COMMUNITY)
Admission: AD | Disposition: A | Discharge status: Skilled Nursing Facility | Payer: Self-pay | Source: Home / Self Care | Attending: Orthopedic Surgery

## 2021-10-21 ENCOUNTER — Ambulatory Visit (HOSPITAL_COMMUNITY): Payer: PPO | Admitting: Certified Registered Nurse Anesthetist

## 2021-10-21 DIAGNOSIS — Z794 Long term (current) use of insulin: Secondary | ICD-10-CM

## 2021-10-21 DIAGNOSIS — K219 Gastro-esophageal reflux disease without esophagitis: Secondary | ICD-10-CM | POA: Diagnosis present

## 2021-10-21 DIAGNOSIS — F419 Anxiety disorder, unspecified: Secondary | ICD-10-CM | POA: Diagnosis not present

## 2021-10-21 DIAGNOSIS — F4310 Post-traumatic stress disorder, unspecified: Secondary | ICD-10-CM | POA: Diagnosis present

## 2021-10-21 DIAGNOSIS — F32A Depression, unspecified: Secondary | ICD-10-CM | POA: Diagnosis not present

## 2021-10-21 DIAGNOSIS — N189 Chronic kidney disease, unspecified: Secondary | ICD-10-CM

## 2021-10-21 DIAGNOSIS — Z881 Allergy status to other antibiotic agents status: Secondary | ICD-10-CM | POA: Diagnosis not present

## 2021-10-21 DIAGNOSIS — Z85828 Personal history of other malignant neoplasm of skin: Secondary | ICD-10-CM

## 2021-10-21 DIAGNOSIS — Z751 Person awaiting admission to adequate facility elsewhere: Secondary | ICD-10-CM | POA: Diagnosis not present

## 2021-10-21 DIAGNOSIS — R2689 Other abnormalities of gait and mobility: Secondary | ICD-10-CM | POA: Diagnosis not present

## 2021-10-21 DIAGNOSIS — D849 Immunodeficiency, unspecified: Secondary | ICD-10-CM | POA: Diagnosis not present

## 2021-10-21 DIAGNOSIS — E1122 Type 2 diabetes mellitus with diabetic chronic kidney disease: Secondary | ICD-10-CM | POA: Diagnosis present

## 2021-10-21 DIAGNOSIS — M6281 Muscle weakness (generalized): Secondary | ICD-10-CM | POA: Diagnosis not present

## 2021-10-21 DIAGNOSIS — Z7985 Long-term (current) use of injectable non-insulin antidiabetic drugs: Secondary | ICD-10-CM

## 2021-10-21 DIAGNOSIS — I129 Hypertensive chronic kidney disease with stage 1 through stage 4 chronic kidney disease, or unspecified chronic kidney disease: Secondary | ICD-10-CM | POA: Diagnosis present

## 2021-10-21 DIAGNOSIS — G8918 Other acute postprocedural pain: Secondary | ICD-10-CM | POA: Diagnosis not present

## 2021-10-21 DIAGNOSIS — G8929 Other chronic pain: Secondary | ICD-10-CM | POA: Diagnosis not present

## 2021-10-21 DIAGNOSIS — M1712 Unilateral primary osteoarthritis, left knee: Secondary | ICD-10-CM

## 2021-10-21 DIAGNOSIS — Z01818 Encounter for other preprocedural examination: Secondary | ICD-10-CM

## 2021-10-21 DIAGNOSIS — Z7901 Long term (current) use of anticoagulants: Secondary | ICD-10-CM | POA: Diagnosis not present

## 2021-10-21 DIAGNOSIS — Z7982 Long term (current) use of aspirin: Secondary | ICD-10-CM | POA: Diagnosis not present

## 2021-10-21 DIAGNOSIS — Z885 Allergy status to narcotic agent status: Secondary | ICD-10-CM | POA: Diagnosis not present

## 2021-10-21 DIAGNOSIS — Z96652 Presence of left artificial knee joint: Secondary | ICD-10-CM | POA: Diagnosis not present

## 2021-10-21 DIAGNOSIS — Z79899 Other long term (current) drug therapy: Secondary | ICD-10-CM

## 2021-10-21 DIAGNOSIS — K580 Irritable bowel syndrome with diarrhea: Secondary | ICD-10-CM | POA: Diagnosis not present

## 2021-10-21 DIAGNOSIS — E1169 Type 2 diabetes mellitus with other specified complication: Secondary | ICD-10-CM | POA: Diagnosis not present

## 2021-10-21 DIAGNOSIS — Z7401 Bed confinement status: Secondary | ICD-10-CM | POA: Diagnosis not present

## 2021-10-21 DIAGNOSIS — E7800 Pure hypercholesterolemia, unspecified: Secondary | ICD-10-CM | POA: Diagnosis not present

## 2021-10-21 DIAGNOSIS — Z8673 Personal history of transient ischemic attack (TIA), and cerebral infarction without residual deficits: Secondary | ICD-10-CM

## 2021-10-21 DIAGNOSIS — Z471 Aftercare following joint replacement surgery: Secondary | ICD-10-CM | POA: Diagnosis not present

## 2021-10-21 DIAGNOSIS — M24562 Contracture, left knee: Secondary | ICD-10-CM | POA: Diagnosis not present

## 2021-10-21 DIAGNOSIS — N184 Chronic kidney disease, stage 4 (severe): Secondary | ICD-10-CM | POA: Diagnosis present

## 2021-10-21 DIAGNOSIS — E1140 Type 2 diabetes mellitus with diabetic neuropathy, unspecified: Secondary | ICD-10-CM | POA: Diagnosis present

## 2021-10-21 DIAGNOSIS — I10 Essential (primary) hypertension: Secondary | ICD-10-CM | POA: Diagnosis not present

## 2021-10-21 DIAGNOSIS — E119 Type 2 diabetes mellitus without complications: Secondary | ICD-10-CM | POA: Diagnosis not present

## 2021-10-21 HISTORY — PX: TOTAL KNEE ARTHROPLASTY: SHX125

## 2021-10-21 LAB — GLUCOSE, CAPILLARY: Glucose-Capillary: 148 mg/dL — ABNORMAL HIGH (ref 70–99)

## 2021-10-21 SURGERY — ARTHROPLASTY, KNEE, TOTAL
Anesthesia: Monitor Anesthesia Care | Site: Knee | Laterality: Left

## 2021-10-21 MED ORDER — NITROGLYCERIN 0.4 MG SL SUBL: 0.4000 mg | SUBLINGUAL_TABLET | SUBLINGUAL | Status: DC | PRN

## 2021-10-21 MED ORDER — TRANEXAMIC ACID-NACL 1000-0.7 MG/100ML-% IV SOLN
1000.0000 mg | INTRAVENOUS | Status: AC
  Administered 2021-10-21: 1000 mg via INTRAVENOUS
  Filled 2021-10-21: qty 100

## 2021-10-21 MED ORDER — MENTHOL 3 MG MT LOZG: 1.0000 | LOZENGE | OROMUCOSAL | Status: DC | PRN

## 2021-10-21 MED ORDER — PIOGLITAZONE HCL 15 MG PO TABS
15.0000 mg | ORAL_TABLET | Freq: Every day | ORAL | Status: DC
  Administered 2021-10-22 – 2021-10-28 (×7): 15 mg via ORAL
  Filled 2021-10-21 (×7): qty 1

## 2021-10-21 MED ORDER — PROPOFOL 10 MG/ML IV BOLUS
INTRAVENOUS | Status: AC
  Filled 2021-10-21: qty 20

## 2021-10-21 MED ORDER — FENTANYL CITRATE (PF) 100 MCG/2ML IJ SOLN
INTRAMUSCULAR | Status: AC
  Filled 2021-10-21: qty 2

## 2021-10-21 MED ORDER — SODIUM CHLORIDE 0.9 % IR SOLN
Status: DC | PRN
  Administered 2021-10-21: 1000 mL

## 2021-10-21 MED ORDER — DEXAMETHASONE SODIUM PHOSPHATE 10 MG/ML IJ SOLN
INTRAMUSCULAR | Status: AC
  Filled 2021-10-21: qty 1

## 2021-10-21 MED ORDER — HYDROCODONE-ACETAMINOPHEN 5-325 MG PO TABS: 1.0000 | ORAL_TABLET | Freq: Four times a day (QID) | ORAL | 0 refills | Status: DC | PRN

## 2021-10-21 MED ORDER — IRBESARTAN 150 MG PO TABS
300.0000 mg | ORAL_TABLET | Freq: Every day | ORAL | Status: DC
  Administered 2021-10-21 – 2021-10-27 (×7): 300 mg via ORAL
  Filled 2021-10-21 (×7): qty 2

## 2021-10-21 MED ORDER — BUPROPION HCL ER (XL) 150 MG PO TB24
150.0000 mg | ORAL_TABLET | Freq: Every day | ORAL | Status: DC
  Filled 2021-10-21: qty 1

## 2021-10-21 MED ORDER — HYDROCODONE-ACETAMINOPHEN 5-325 MG PO TABS
1.0000 | ORAL_TABLET | ORAL | Status: DC | PRN
  Administered 2021-10-21 – 2021-10-22 (×5): 2 via ORAL
  Administered 2021-10-23: 1 via ORAL
  Administered 2021-10-23: 2 via ORAL
  Administered 2021-10-24 – 2021-10-26 (×5): 1 via ORAL
  Administered 2021-10-27 – 2021-10-28 (×6): 2 via ORAL
  Filled 2021-10-21 (×2): qty 2
  Filled 2021-10-21 (×2): qty 1
  Filled 2021-10-21: qty 2
  Filled 2021-10-21: qty 1
  Filled 2021-10-21 (×5): qty 2
  Filled 2021-10-21: qty 1
  Filled 2021-10-21: qty 2
  Filled 2021-10-21: qty 1
  Filled 2021-10-21 (×5): qty 2
  Filled 2021-10-21: qty 1

## 2021-10-21 MED ORDER — PROPOFOL 500 MG/50ML IV EMUL
INTRAVENOUS | Status: DC | PRN
  Administered 2021-10-21: 50 ug/kg/min via INTRAVENOUS

## 2021-10-21 MED ORDER — LACTATED RINGERS IV SOLN: INTRAVENOUS | Status: DC

## 2021-10-21 MED ORDER — ALBUTEROL SULFATE HFA 108 (90 BASE) MCG/ACT IN AERS: 2.0000 | INHALATION_SPRAY | Freq: Four times a day (QID) | RESPIRATORY_TRACT | Status: DC | PRN

## 2021-10-21 MED ORDER — BUPIVACAINE LIPOSOME 1.3 % IJ SUSP: 20.0000 mL | Freq: Once | INTRAMUSCULAR | Status: AC

## 2021-10-21 MED ORDER — LORATADINE 10 MG PO TABS
10.0000 mg | ORAL_TABLET | Freq: Every day | ORAL | Status: DC
  Administered 2021-10-21 – 2021-10-27 (×7): 10 mg via ORAL
  Filled 2021-10-21 (×7): qty 1

## 2021-10-21 MED ORDER — DEXAMETHASONE SODIUM PHOSPHATE 10 MG/ML IJ SOLN
INTRAMUSCULAR | Status: DC | PRN
  Administered 2021-10-21: 4 mg via INTRAVENOUS

## 2021-10-21 MED ORDER — MIDAZOLAM HCL 2 MG/2ML IJ SOLN
INTRAMUSCULAR | Status: AC
  Filled 2021-10-21: qty 2

## 2021-10-21 MED ORDER — ACETAMINOPHEN 10 MG/ML IV SOLN
INTRAVENOUS | Status: AC
  Filled 2021-10-21: qty 100

## 2021-10-21 MED ORDER — DONEPEZIL HCL 10 MG PO TABS
5.0000 mg | ORAL_TABLET | Freq: Every day | ORAL | Status: DC
  Administered 2021-10-21 – 2021-10-27 (×7): 5 mg via ORAL
  Filled 2021-10-21 (×7): qty 1

## 2021-10-21 MED ORDER — EPHEDRINE SULFATE-NACL 50-0.9 MG/10ML-% IV SOSY
PREFILLED_SYRINGE | INTRAVENOUS | Status: DC | PRN
  Administered 2021-10-21 (×2): 5 mg via INTRAVENOUS

## 2021-10-21 MED ORDER — METHOCARBAMOL 500 MG IVPB - SIMPLE MED
INTRAVENOUS | Status: AC
  Filled 2021-10-21: qty 55

## 2021-10-21 MED ORDER — SODIUM CHLORIDE (PF) 0.9 % IJ SOLN
INTRAMUSCULAR | Status: DC | PRN
  Administered 2021-10-21: 30 mL

## 2021-10-21 MED ORDER — MIDAZOLAM HCL 5 MG/5ML IJ SOLN
INTRAMUSCULAR | Status: DC | PRN
  Administered 2021-10-21 (×2): 1 mg via INTRAVENOUS

## 2021-10-21 MED ORDER — CO Q-10 30 MG PO CAPS: ORAL_CAPSULE | Freq: Every day | ORAL | Status: DC

## 2021-10-21 MED ORDER — POLYETHYLENE GLYCOL 3350 17 G PO PACK: 17.0000 g | PACK | Freq: Every day | ORAL | Status: DC | PRN

## 2021-10-21 MED ORDER — FENTANYL CITRATE PF 50 MCG/ML IJ SOSY
25.0000 ug | PREFILLED_SYRINGE | INTRAMUSCULAR | Status: DC | PRN
  Administered 2021-10-21 (×2): 50 ug via INTRAVENOUS

## 2021-10-21 MED ORDER — OIL BASE EX LIQD: Freq: Four times a day (QID) | CUTANEOUS | Status: DC | PRN

## 2021-10-21 MED ORDER — ORAL CARE MOUTH RINSE: 15.0000 mL | OROMUCOSAL | Status: DC | PRN

## 2021-10-21 MED ORDER — BUPIVACAINE-EPINEPHRINE (PF) 0.25% -1:200000 IJ SOLN
INTRAMUSCULAR | Status: AC
  Filled 2021-10-21: qty 30

## 2021-10-21 MED ORDER — CHLORHEXIDINE GLUCONATE 0.12 % MT SOLN
15.0000 mL | Freq: Once | OROMUCOSAL | Status: AC
  Administered 2021-10-21: 15 mL via OROMUCOSAL

## 2021-10-21 MED ORDER — POVIDONE-IODINE 10 % EX SWAB
2.0000 | Freq: Once | CUTANEOUS | Status: AC
  Administered 2021-10-21: 2 via TOPICAL

## 2021-10-21 MED ORDER — PHENYLEPHRINE HCL-NACL 20-0.9 MG/250ML-% IV SOLN
INTRAVENOUS | Status: DC | PRN
  Administered 2021-10-21: 40 ug/min via INTRAVENOUS

## 2021-10-21 MED ORDER — ONDANSETRON HCL 4 MG PO TABS: 4.0000 mg | ORAL_TABLET | Freq: Four times a day (QID) | ORAL | Status: DC | PRN

## 2021-10-21 MED ORDER — ONDANSETRON HCL 4 MG PO TABS: 4.0000 mg | ORAL_TABLET | Freq: Three times a day (TID) | ORAL | 1 refills | Status: AC | PRN

## 2021-10-21 MED ORDER — ALBUTEROL SULFATE (2.5 MG/3ML) 0.083% IN NEBU: 2.5000 mg | INHALATION_SOLUTION | Freq: Four times a day (QID) | RESPIRATORY_TRACT | Status: DC | PRN

## 2021-10-21 MED ORDER — WATER FOR IRRIGATION, STERILE IR SOLN
Status: DC | PRN
  Administered 2021-10-21: 2000 mL

## 2021-10-21 MED ORDER — BUPIVACAINE-EPINEPHRINE 0.25% -1:200000 IJ SOLN
INTRAMUSCULAR | Status: DC | PRN
  Administered 2021-10-21: 30 mL

## 2021-10-21 MED ORDER — VITAMIN D 25 MCG (1000 UNIT) PO TABS
1000.0000 [IU] | ORAL_TABLET | Freq: Every day | ORAL | Status: DC
  Administered 2021-10-22 – 2021-10-27 (×6): 1000 [IU] via ORAL
  Filled 2021-10-21 (×6): qty 1

## 2021-10-21 MED ORDER — TRANEXAMIC ACID-NACL 1000-0.7 MG/100ML-% IV SOLN
1000.0000 mg | Freq: Once | INTRAVENOUS | Status: AC
  Administered 2021-10-21: 1000 mg via INTRAVENOUS
  Filled 2021-10-21: qty 100

## 2021-10-21 MED ORDER — ONDANSETRON HCL 4 MG/2ML IJ SOLN
INTRAMUSCULAR | Status: DC | PRN
  Administered 2021-10-21: 4 mg via INTRAVENOUS

## 2021-10-21 MED ORDER — MORPHINE SULFATE (PF) 2 MG/ML IV SOLN
0.5000 mg | INTRAVENOUS | Status: DC | PRN
  Administered 2021-10-21 (×2): 1 mg via INTRAVENOUS
  Filled 2021-10-21 (×2): qty 1

## 2021-10-21 MED ORDER — ACETAMINOPHEN 325 MG PO TABS: 325.0000 mg | ORAL_TABLET | Freq: Four times a day (QID) | ORAL | Status: DC | PRN

## 2021-10-21 MED ORDER — HAIR/SKIN/NAILS PO TABS: ORAL_TABLET | Freq: Every day | ORAL | Status: DC

## 2021-10-21 MED ORDER — FENTANYL CITRATE PF 50 MCG/ML IJ SOSY
PREFILLED_SYRINGE | INTRAMUSCULAR | Status: AC
  Filled 2021-10-21: qty 1

## 2021-10-21 MED ORDER — BUPROPION HCL ER (XL) 150 MG PO TB24
150.0000 mg | ORAL_TABLET | Freq: Every day | ORAL | Status: DC
  Administered 2021-10-21 – 2021-10-27 (×7): 150 mg via ORAL
  Filled 2021-10-21 (×7): qty 1

## 2021-10-21 MED ORDER — ONDANSETRON HCL 4 MG/2ML IJ SOLN: 4.0000 mg | Freq: Four times a day (QID) | INTRAMUSCULAR | Status: DC | PRN

## 2021-10-21 MED ORDER — DOCUSATE SODIUM 100 MG PO CAPS
100.0000 mg | ORAL_CAPSULE | Freq: Two times a day (BID) | ORAL | Status: DC
  Administered 2021-10-21 – 2021-10-28 (×13): 100 mg via ORAL
  Filled 2021-10-21 (×14): qty 1

## 2021-10-21 MED ORDER — IRBESARTAN 150 MG PO TABS
300.0000 mg | ORAL_TABLET | Freq: Every day | ORAL | Status: DC
  Filled 2021-10-21: qty 2

## 2021-10-21 MED ORDER — METHOCARBAMOL 500 MG IVPB - SIMPLE MED
500.0000 mg | Freq: Four times a day (QID) | INTRAVENOUS | Status: DC | PRN
  Administered 2021-10-21: 500 mg via INTRAVENOUS

## 2021-10-21 MED ORDER — FENTANYL CITRATE (PF) 100 MCG/2ML IJ SOLN
INTRAMUSCULAR | Status: DC | PRN
  Administered 2021-10-21 (×2): 50 ug via INTRAVENOUS

## 2021-10-21 MED ORDER — METOCLOPRAMIDE HCL 5 MG/ML IJ SOLN: 5.0000 mg | Freq: Three times a day (TID) | INTRAMUSCULAR | Status: DC | PRN

## 2021-10-21 MED ORDER — METHOCARBAMOL 500 MG PO TABS
500.0000 mg | ORAL_TABLET | Freq: Four times a day (QID) | ORAL | Status: DC | PRN
  Administered 2021-10-21 – 2021-10-28 (×7): 500 mg via ORAL
  Filled 2021-10-21 (×9): qty 1

## 2021-10-21 MED ORDER — ACETAMINOPHEN 500 MG PO TABS: 1000.0000 mg | ORAL_TABLET | Freq: Once | ORAL | Status: DC | PRN

## 2021-10-21 MED ORDER — ORAL CARE MOUTH RINSE: 15.0000 mL | Freq: Once | OROMUCOSAL | Status: AC

## 2021-10-21 MED ORDER — ZINC SULFATE 220 (50 ZN) MG PO CAPS: 220.0000 mg | ORAL_CAPSULE | Freq: Every day | ORAL | Status: DC

## 2021-10-21 MED ORDER — BUPIVACAINE LIPOSOME 1.3 % IJ SUSP
INTRAMUSCULAR | Status: DC | PRN
  Administered 2021-10-21: 20 mL

## 2021-10-21 MED ORDER — METOCLOPRAMIDE HCL 5 MG PO TABS: 5.0000 mg | ORAL_TABLET | Freq: Three times a day (TID) | ORAL | Status: DC | PRN

## 2021-10-21 MED ORDER — VITAMIN B-12 1000 MCG PO TABS
1000.0000 ug | ORAL_TABLET | Freq: Every day | ORAL | Status: DC
  Administered 2021-10-22 – 2021-10-27 (×6): 1000 ug via ORAL
  Filled 2021-10-21 (×5): qty 1

## 2021-10-21 MED ORDER — ZINC SULFATE 220 (50 ZN) MG PO CAPS
220.0000 mg | ORAL_CAPSULE | Freq: Every day | ORAL | Status: DC
  Administered 2021-10-22 – 2021-10-27 (×6): 220 mg via ORAL
  Filled 2021-10-21 (×6): qty 1

## 2021-10-21 MED ORDER — CITALOPRAM HYDROBROMIDE 20 MG PO TABS
40.0000 mg | ORAL_TABLET | Freq: Every day | ORAL | Status: DC
  Administered 2021-10-21 – 2021-10-27 (×7): 40 mg via ORAL
  Filled 2021-10-21 (×7): qty 2

## 2021-10-21 MED ORDER — VITAMIN B-12 1000 MCG PO TABS: 1000.0000 ug | ORAL_TABLET | Freq: Every day | ORAL | Status: DC

## 2021-10-21 MED ORDER — FUROSEMIDE 20 MG PO TABS
20.0000 mg | ORAL_TABLET | Freq: Every day | ORAL | Status: DC
  Administered 2021-10-22 – 2021-10-28 (×7): 20 mg via ORAL
  Filled 2021-10-21 (×8): qty 1

## 2021-10-21 MED ORDER — METHOCARBAMOL 500 MG PO TABS: 500.0000 mg | ORAL_TABLET | Freq: Three times a day (TID) | ORAL | 1 refills | Status: DC | PRN

## 2021-10-21 MED ORDER — CLONAZEPAM 1 MG PO TABS
1.0000 mg | ORAL_TABLET | Freq: Two times a day (BID) | ORAL | Status: DC
  Filled 2021-10-21: qty 1

## 2021-10-21 MED ORDER — VITAMIN D3 25 MCG (1000 UNIT) PO TABS
1000.0000 [IU] | ORAL_TABLET | Freq: Every day | ORAL | Status: DC
  Filled 2021-10-21: qty 1

## 2021-10-21 MED ORDER — ATORVASTATIN CALCIUM 40 MG PO TABS
40.0000 mg | ORAL_TABLET | Freq: Every day | ORAL | Status: DC
  Administered 2021-10-21 – 2021-10-27 (×7): 40 mg via ORAL
  Filled 2021-10-21 (×7): qty 1

## 2021-10-21 MED ORDER — BUPIVACAINE-EPINEPHRINE (PF) 0.5% -1:200000 IJ SOLN
INTRAMUSCULAR | Status: DC | PRN
  Administered 2021-10-21: 20 mL via PERINEURAL

## 2021-10-21 MED ORDER — LORATADINE 10 MG PO TABS
10.0000 mg | ORAL_TABLET | Freq: Every day | ORAL | Status: DC
  Filled 2021-10-21: qty 1

## 2021-10-21 MED ORDER — PHENOL 1.4 % MT LIQD: 1.0000 | OROMUCOSAL | Status: DC | PRN

## 2021-10-21 MED ORDER — ACETAMINOPHEN ER 650 MG PO TBCR: 650.0000 mg | EXTENDED_RELEASE_TABLET | Freq: Three times a day (TID) | ORAL | Status: DC | PRN

## 2021-10-21 MED ORDER — PANTOPRAZOLE SODIUM 40 MG PO TBEC
40.0000 mg | DELAYED_RELEASE_TABLET | Freq: Every day | ORAL | Status: DC
  Filled 2021-10-21: qty 1

## 2021-10-21 MED ORDER — ACETAMINOPHEN 160 MG/5ML PO SOLN: 1000.0000 mg | Freq: Once | ORAL | Status: DC | PRN

## 2021-10-21 MED ORDER — DULAGLUTIDE 3 MG/0.5ML ~~LOC~~ SOAJ: 3.0000 mg | SUBCUTANEOUS | Status: DC

## 2021-10-21 MED ORDER — GABAPENTIN 300 MG PO CAPS
300.0000 mg | ORAL_CAPSULE | Freq: Two times a day (BID) | ORAL | Status: DC
  Administered 2021-10-21 – 2021-10-28 (×15): 300 mg via ORAL
  Filled 2021-10-21 (×15): qty 1

## 2021-10-21 MED ORDER — 0.9 % SODIUM CHLORIDE (POUR BTL) OPTIME
TOPICAL | Status: DC | PRN
  Administered 2021-10-21: 1000 mL

## 2021-10-21 MED ORDER — PHENYLEPHRINE 80 MCG/ML (10ML) SYRINGE FOR IV PUSH (FOR BLOOD PRESSURE SUPPORT)
PREFILLED_SYRINGE | INTRAVENOUS | Status: DC | PRN
  Administered 2021-10-21 (×3): 80 ug via INTRAVENOUS
  Administered 2021-10-21: 40 ug via INTRAVENOUS

## 2021-10-21 MED ORDER — BUPIVACAINE LIPOSOME 1.3 % IJ SUSP
INTRAMUSCULAR | Status: AC
  Filled 2021-10-21: qty 20

## 2021-10-21 MED ORDER — SODIUM CHLORIDE 0.9 % IV SOLN: INTRAVENOUS | Status: DC

## 2021-10-21 MED ORDER — CLONAZEPAM 1 MG PO TABS
1.0000 mg | ORAL_TABLET | Freq: Every day | ORAL | Status: DC
  Administered 2021-10-21 – 2021-10-27 (×7): 1 mg via ORAL
  Filled 2021-10-21 (×7): qty 1

## 2021-10-21 MED ORDER — ZINC GLUCONATE 50 MG PO TABS: 50.0000 mg | ORAL_TABLET | Freq: Every day | ORAL | Status: DC

## 2021-10-21 MED ORDER — MELATONIN 5 MG PO TABS
10.0000 mg | ORAL_TABLET | Freq: Every day | ORAL | Status: DC
  Administered 2021-10-21 – 2021-10-27 (×7): 10 mg via ORAL
  Filled 2021-10-21 (×9): qty 2

## 2021-10-21 MED ORDER — PANTOPRAZOLE SODIUM 40 MG PO TBEC
40.0000 mg | DELAYED_RELEASE_TABLET | Freq: Every day | ORAL | Status: DC
  Administered 2021-10-21 – 2021-10-27 (×7): 40 mg via ORAL
  Filled 2021-10-21 (×7): qty 1

## 2021-10-21 MED ORDER — ONDANSETRON HCL 4 MG/2ML IJ SOLN
INTRAMUSCULAR | Status: AC
  Filled 2021-10-21: qty 2

## 2021-10-21 MED ORDER — CEFAZOLIN SODIUM-DEXTROSE 2-4 GM/100ML-% IV SOLN
2.0000 g | INTRAVENOUS | Status: AC
  Administered 2021-10-21: 2 g via INTRAVENOUS
  Filled 2021-10-21: qty 100

## 2021-10-21 MED ORDER — FUROSEMIDE 20 MG PO TABS
20.0000 mg | ORAL_TABLET | Freq: Every day | ORAL | Status: DC
  Filled 2021-10-21: qty 1

## 2021-10-21 MED ORDER — CEFAZOLIN SODIUM-DEXTROSE 2-4 GM/100ML-% IV SOLN
2.0000 g | Freq: Four times a day (QID) | INTRAVENOUS | Status: AC
  Administered 2021-10-21 (×2): 2 g via INTRAVENOUS
  Filled 2021-10-21 (×2): qty 100

## 2021-10-21 MED ORDER — PROPOFOL 10 MG/ML IV BOLUS
INTRAVENOUS | Status: DC | PRN
  Administered 2021-10-21: 30 mg via INTRAVENOUS

## 2021-10-21 MED ORDER — HYDROCODONE-ACETAMINOPHEN 5-325 MG PO TABS
ORAL_TABLET | ORAL | Status: AC
  Filled 2021-10-21: qty 1

## 2021-10-21 MED ORDER — ASPIRIN 81 MG PO CHEW
81.0000 mg | CHEWABLE_TABLET | Freq: Two times a day (BID) | ORAL | Status: DC
  Filled 2021-10-21 (×2): qty 1

## 2021-10-21 MED ORDER — PHENYLEPHRINE HCL (PRESSORS) 10 MG/ML IV SOLN
INTRAVENOUS | Status: AC
  Filled 2021-10-21: qty 1

## 2021-10-21 MED ORDER — ACETAMINOPHEN 10 MG/ML IV SOLN
1000.0000 mg | Freq: Once | INTRAVENOUS | Status: DC | PRN
  Administered 2021-10-21: 1000 mg via INTRAVENOUS

## 2021-10-21 MED ORDER — BUPIVACAINE IN DEXTROSE 0.75-8.25 % IT SOLN
INTRATHECAL | Status: DC | PRN
  Administered 2021-10-21: 1.8 mL via INTRATHECAL

## 2021-10-21 MED ORDER — BISACODYL 10 MG RE SUPP: 10.0000 mg | Freq: Every day | RECTAL | Status: DC | PRN

## 2021-10-21 MED ORDER — CITALOPRAM HYDROBROMIDE 20 MG PO TABS
40.0000 mg | ORAL_TABLET | Freq: Every day | ORAL | Status: DC
  Filled 2021-10-21: qty 2

## 2021-10-21 MED ORDER — SODIUM CHLORIDE (PF) 0.9 % IJ SOLN
INTRAMUSCULAR | Status: AC
  Filled 2021-10-21: qty 30

## 2021-10-21 SURGICAL SUPPLY — 52 items
ATTUNE PSFEM LTSZ5 NARCEM KNEE (Femur) IMPLANT
ATTUNE PSRP INSR SZ 5 10M KNEE (Insert) IMPLANT
BAG COUNTER SPONGE SURGICOUNT (BAG) IMPLANT
BAG ZIPLOCK 12X15 (MISCELLANEOUS) IMPLANT
BASE TIBIAL ROT PLAT SZ 5 KNEE (Knees) IMPLANT
BLADE SAG 13.0X1.37X90 (BLADE) IMPLANT
BLADE SAG 18X100X1.27 (BLADE) ×1 IMPLANT
BLADE SAW SGTL 13X75X1.27 (BLADE) ×1 IMPLANT
BNDG ELASTIC 6X10 VLCR STRL LF (GAUZE/BANDAGES/DRESSINGS) ×1 IMPLANT
BNDG ELASTIC 6X5.8 VLCR STR LF (GAUZE/BANDAGES/DRESSINGS) IMPLANT
BNDG GAUZE DERMACEA FLUFF 4 (GAUZE/BANDAGES/DRESSINGS) ×1 IMPLANT
BOWL SMART MIX CTS (DISPOSABLE) ×1 IMPLANT
CEMENT HV SMART SET (Cement) ×2 IMPLANT
CLSR STERI-STRIP ANTIMIC 1/2X4 (GAUZE/BANDAGES/DRESSINGS) IMPLANT
COVER SURGICAL LIGHT HANDLE (MISCELLANEOUS) ×1 IMPLANT
CUFF TOURN SGL QUICK 34 (TOURNIQUET CUFF) ×1
CUFF TRNQT CYL 34X4.125X (TOURNIQUET CUFF) ×1 IMPLANT
DRAPE INCISE IOBAN 66X45 STRL (DRAPES) ×1 IMPLANT
DRAPE SHEET LG 3/4 BI-LAMINATE (DRAPES) ×1 IMPLANT
DRAPE U-SHAPE 47X51 STRL (DRAPES) ×1 IMPLANT
DRSG ADAPTIC 3X8 NADH LF (GAUZE/BANDAGES/DRESSINGS) ×1 IMPLANT
DRSG AQUACEL AG ADV 3.5X10 (GAUZE/BANDAGES/DRESSINGS) IMPLANT
DURAPREP 26ML APPLICATOR (WOUND CARE) ×1 IMPLANT
ELECT REM PT RETURN 15FT ADLT (MISCELLANEOUS) ×1 IMPLANT
GAUZE PAD ABD 8X10 STRL (GAUZE/BANDAGES/DRESSINGS) ×1 IMPLANT
GAUZE SPONGE 4X4 12PLY STRL (GAUZE/BANDAGES/DRESSINGS) ×1 IMPLANT
GLOVE BIOGEL PI IND STRL 7.5 (GLOVE) ×1 IMPLANT
GLOVE BIOGEL PI IND STRL 8.5 (GLOVE) ×1 IMPLANT
GLOVE ORTHO TXT STRL SZ7.5 (GLOVE) ×1 IMPLANT
GLOVE SURG ORTHO 8.5 STRL (GLOVE) ×2 IMPLANT
GOWN STRL REUS W/ TWL XL LVL3 (GOWN DISPOSABLE) ×2 IMPLANT
GOWN STRL REUS W/TWL XL LVL3 (GOWN DISPOSABLE) ×2
HANDPIECE INTERPULSE COAX TIP (DISPOSABLE) ×1
IMMOBILIZER KNEE 20 (SOFTGOODS) ×1
IMMOBILIZER KNEE 20 THIGH 36 (SOFTGOODS) IMPLANT
KIT TURNOVER KIT A (KITS) IMPLANT
MANIFOLD NEPTUNE II (INSTRUMENTS) ×1 IMPLANT
NS IRRIG 1000ML POUR BTL (IV SOLUTION) ×1 IMPLANT
PACK TOTAL KNEE CUSTOM (KITS) ×1 IMPLANT
PATELLA MEDIAL ATTUN 35MM KNEE (Knees) IMPLANT
PROTECTOR NERVE ULNAR (MISCELLANEOUS) ×1 IMPLANT
SET HNDPC FAN SPRY TIP SCT (DISPOSABLE) ×1 IMPLANT
STAPLER VISISTAT 35W (STAPLE) IMPLANT
STRIP CLOSURE SKIN 1/2X4 (GAUZE/BANDAGES/DRESSINGS) ×2 IMPLANT
SUT MNCRL AB 3-0 PS2 18 (SUTURE) ×1 IMPLANT
SUT VIC AB 0 CT1 36 (SUTURE) ×1 IMPLANT
SUT VIC AB 1 CT1 36 (SUTURE) ×3 IMPLANT
SUT VIC AB 2-0 CT1 27 (SUTURE) ×1
SUT VIC AB 2-0 CT1 TAPERPNT 27 (SUTURE) ×1 IMPLANT
TIBIAL BASE ROT PLAT SZ 5 KNEE (Knees) ×1 IMPLANT
WATER STERILE IRR 1000ML POUR (IV SOLUTION) ×2 IMPLANT
WRAP KNEE MAXI GEL POST OP (GAUZE/BANDAGES/DRESSINGS) IMPLANT

## 2021-10-21 NOTE — Anesthesia Preprocedure Evaluation (Addendum)
Anesthesia Evaluation  Patient identified by MRN, date of birth, ID band Patient awake    Reviewed: Allergy & Precautions, NPO status , Patient's Chart, lab work & pertinent test results  History of Anesthesia Complications (+) PONV and history of anesthetic complications  Airway Mallampati: III  TM Distance: >3 FB Neck ROM: Full    Dental  (+) Teeth Intact, Dental Advisory Given   Pulmonary neg shortness of breath, asthma , neg sleep apnea, neg COPD, neg recent URI,    breath sounds clear to auscultation       Cardiovascular hypertension, Pt. on medications (-) angina(-) Past MI and (-) CHF  Rhythm:Regular     Neuro/Psych neg Seizures PSYCHIATRIC DISORDERS Anxiety Depression CVA    GI/Hepatic Neg liver ROS, GERD  ,  Endo/Other  diabetesLab Results      Component                Value               Date                      HGBA1C                   6.6 (H)             10/14/2021             Renal/GU CRFRenal disease     Musculoskeletal  (+) Arthritis ,   Abdominal   Peds  Hematology  (+) REFUSES BLOOD PRODUCTS, Lab Results      Component                Value               Date                      WBC                      4.7                 10/14/2021                HGB                      11.9 (L)            10/14/2021                HCT                      37.9                10/14/2021                MCV                      95.2                10/14/2021                PLT                      166                 10/14/2021              Anesthesia Other Findings  Reproductive/Obstetrics                           Anesthesia Physical Anesthesia Plan  ASA: 3  Anesthesia Plan: MAC, Spinal and Regional   Post-op Pain Management: Regional block*   Induction: Intravenous  PONV Risk Score and Plan: 2 and Propofol infusion  Airway Management Planned: Nasal Cannula and Natural  Airway  Additional Equipment: None  Intra-op Plan:   Post-operative Plan:   Informed Consent: I have reviewed the patients History and Physical, chart, labs and discussed the procedure including the risks, benefits and alternatives for the proposed anesthesia with the patient or authorized representative who has indicated his/her understanding and acceptance.     Dental advisory given  Plan Discussed with: CRNA  Anesthesia Plan Comments:         Anesthesia Quick Evaluation

## 2021-10-21 NOTE — Anesthesia Procedure Notes (Signed)
Anesthesia Regional Block: Adductor canal block   Pre-Anesthetic Checklist: , timeout performed,  Correct Patient, Correct Site, Correct Laterality,  Correct Procedure, Correct Position, site marked,  Risks and benefits discussed,  Surgical consent,  Pre-op evaluation,  At surgeon's request and post-op pain management  Laterality: Left and Lower  Prep: chloraprep       Needles:  Injection technique: Single-shot      Needle Length: 9cm  Needle Gauge: 22     Additional Needles: Arrow StimuQuik ECHO Echogenic Stimulating PNB Needle  Procedures:,,,, ultrasound used (permanent image in chart),,    Narrative:  Start time: 10/21/2021 7:18 AM End time: 10/21/2021 7:22 AM Injection made incrementally with aspirations every 5 mL.  Performed by: Personally  Anesthesiologist: Oleta Mouse, MD

## 2021-10-21 NOTE — Anesthesia Procedure Notes (Signed)
Procedure Name: MAC Date/Time: 10/21/2021 7:34 AM  Performed by: Deliah Boston, CRNAPre-anesthesia Checklist: Patient identified, Emergency Drugs available, Suction available and Patient being monitored Patient Re-evaluated:Patient Re-evaluated prior to induction Oxygen Delivery Method: Simple face mask Preoxygenation: Pre-oxygenation with 100% oxygen Placement Confirmation: positive ETCO2 and breath sounds checked- equal and bilateral

## 2021-10-21 NOTE — Op Note (Signed)
NAME: Whitney [name], Whitney [name] MEDICAL RECORD NO: [id number] ACCOUNT NO: [id number] DATE OF BIRTH: 05-Mar-1945 FACILITY: [facility] LOCATION: [facility] PHYSICIAN: Doran Heater. Veverly Fells, MD  Operative Report   DATE OF PROCEDURE: 10/21/2021   PREOPERATIVE DIAGNOSIS:  Left knee end-stage arthritis.  POSTOPERATIVE DIAGNOSIS:  Left knee end-stage arthritis.  PROCEDURE PERFORMED:  Left total knee replacement using DePuy Attune prosthesis.  ATTENDING SURGEON:  Doran Heater. Veverly Fells, MD  ASSISTANT:  Darol Destine, MD, PA-C, who was scrubbed during the entire procedure, and necessary for satisfactory completion of surgery, spinal anesthesia plus adductor canal block was used.  ESTIMATED BLOOD LOSS:  Minimal.  FLUID REPLACEMENT:  1500 mL crystalloid.  INSTRUMENT COUNTS:  Correct.  COMPLICATIONS:  No complications.  ANTIBIOTICS:  Perioperative antibiotics were given.  TOURNIQUET TIME:  1 hour and 20 minutes at 325 mmHg.  INDICATIONS:  The patient is a 76 year old female with worsening left knee pain secondary to bone-on-bone end-stage arthritis.  The patient has had progressive pain despite conservative management, presents for operative treatment to eliminate pain and  restore function.  Informed consent obtained.  DESCRIPTION OF PROCEDURE:  After an adequate level of spinal anesthesia and the adductor canal block was applied the patient was placed in supine position.  Left leg correctly identified.  A nonsterile tourniquet placed on proximal thigh.  Left leg  sterilely prepped and draped in the usual manner.  Timeout called, verifying correct patient, correct site, we elevated the limb, exsanguinated with an Esmarch bandage and inflating the tourniquet to 325 mmHg.  We then placed the knee in flexion. After  verifying our timeout, we entered the knee using standard midline incision with a 10 blade scalpel.  Dissection down through the subcutaneous tissues with a scalpel.  We used a fresh 10 blade for the  medial parapatellar arthrotomy.  We then divided  lateral patellofemoral ligaments everting the patella.  We noted eburnated bone throughout the knee with end-stage arthritis noted.  We entered the distal femur with a step cut drill.  We then placed our intramedullary guide and resected 10 mm off the  affected left side, for this left knee 5 degrees of valgus, the patient did have a slight flexion contracture.  Once we made our distal cut, we sized our femur to a size 5 anterior down performed anterior, posterior and chamfer cuts with the 4-in-1  block.  We then removed ACL and PCL meniscal tissue subluxing the tibia anteriorly and then performing our tibial cut 90 degrees perpendicular to the long axis of the tibia with minimal posterior slope, resecting 2 mm off the medial side. With our tibial  cut done, we used a lamina spreader, removed excess posterior femoral condyle osteophytes, checked our gaps symmetric at 6 mm.  We then completed our tibial preparation with modular drill and keel punch for the 5 tibia.  We then went to the femur and  did our box cut, placed our trial femur in place and drilling our lug holes.  We reduced the knee with 8 mm poly spacer and we went ahead and took the knee through a full range of motion.  We had good stability both in flexion and extension.  We then  went ahead and resurfaced our patella going from 26 mm thickness down to 16 mm thickness.  We drilled the lug holes for the 35 patellar button.  Once we had that in place, we ranged the knee with excellent patellar tracking with no-touch technique.  We  removed the trial components,  irrigating bone and drying the bone well.  We vacuum mixed high viscosity cement on the back table, cemented the components into place with the tibia, femur, and patella all in one step.  We used an 8 mm trial bearing during  the cementing process.  Once the cement was hardened, we removed excess cement with quarter-inch curved osteotome  inspecting throughout the entire knee including posteriorly.  Once we had all the cement removed we actually chose a size 5, 10 mm bearing  and placed that on the tibia, reducing the knee.  We had good stability in flexion and extension with the ability to get fully straight.  Patellar tracking was excellent.  We irrigated thoroughly.  I did inject a combination of Marcaine, Exparel and  saline, both in the posterior capsule mid case and then also in the anterior capsule at the end of the surgery.  Once we had the knee irrigated thoroughly, we closed the parapatellar arthrotomy with #1 Vicryl suture, followed by 2-0 Vicryl subcutaneous  closure and 4-0 Monocryl for skin.  Steri-Strips applied followed by sterile dressing.  The patient tolerated surgery well.     Elián.Darby D: 10/21/2021 9:35:01 am T: 10/21/2021 11:54:00 am  JOB: [id number]/ [id number]

## 2021-10-21 NOTE — Interval H&P Note (Signed)
History and Physical Interval Note:  10/21/2021 7:13 AM  Whitney [name]  has presented today for surgery, with the diagnosis of left knee osteoarthritis end stage.  The various methods of treatment have been discussed with the patient and family. After consideration of risks, benefits and other options for treatment, the patient has consented to  Procedure(s): TOTAL KNEE ARTHROPLASTY (Left) as a surgical intervention.  The patient's history has been reviewed, patient examined, no change in status, stable for surgery.  I have reviewed the patient's chart and labs.  Questions were answered to the patient's satisfaction.     Augustin Schooling

## 2021-10-21 NOTE — Anesthesia Procedure Notes (Signed)
Spinal  Patient location during procedure: OR Start time: 10/21/2021 7:38 AM End time: 10/21/2021 7:43 AM Reason for block: surgical anesthesia Staffing Performed: anesthesiologist  Anesthesiologist: Oleta Mouse, MD Performed by: Oleta Mouse, MD Authorized by: Oleta Mouse, MD   Preanesthetic Checklist Completed: patient identified, IV checked, risks and benefits discussed, surgical consent, monitors and equipment checked, pre-op evaluation and timeout performed Spinal Block Patient position: sitting Prep: DuraPrep Patient monitoring: heart rate, cardiac monitor, continuous pulse ox and blood pressure Approach: midline Location: L3-4 Injection technique: single-shot Needle Needle type: Pencan  Needle gauge: 24 G Needle length: 9 cm Assessment Sensory level: T6 Events: CSF return

## 2021-10-21 NOTE — TOC Transition Note (Signed)
Transition of Care [facility]) - CM/SW Discharge Note   Patient Details  Name: Whitney [name] MRN: [id number] Date of Birth: [birth year]  [facility] CM/SW Contact:  Lennart Pall, LCSW Phone Number: 10/21/2021, 1:33 PM   Clinical Narrative:     Met with pt and family today and confirming she has all needed DME at home.  Pt confirms she has OPPT set up with [facility], however, she and family prefer this be changed to [facility].  Because pt is in Ortho Bundle, I have left a VM for Leonides Grills, Bundle CM to please follow up with them as they will need to approve and set up this change.  No further TOC needs.  Final next level of care: OP Rehab Barriers to Discharge: No Barriers Identified   Patient Goals and CMS Choice        Discharge Placement                       Discharge Plan and Services                DME Arranged: N/A DME Agency: NA                  Social Determinants of Health (SDOH) Interventions     Readmission Risk Interventions     No data to display

## 2021-10-21 NOTE — Transfer of Care (Signed)
Immediate Anesthesia Transfer of Care Note  Patient: Whitney [name]  Procedure(s) Performed: Procedure(s): TOTAL KNEE ARTHROPLASTY (Left)  Patient Location: PACU  Anesthesia Type:Spinal and MAC combined with regional for post-op pain  Level of Consciousness: Patient easily awoken, sedated, comfortable, cooperative, following commands, responds to stimulation.   Airway & Oxygen Therapy: Patient spontaneously breathing, ventilating well, oxygen via simple oxygen mask.  Post-op Assessment: Report given to PACU RN, vital signs reviewed and stable.   Post vital signs: Reviewed and stable.  Complications: No apparent anesthesia complications Last Vitals:  Vitals Value Taken Time  BP 130/52 10/21/21 0930  Temp    Pulse 77 10/21/21 0932  Resp 22 10/21/21 0932  SpO2 98 % 10/21/21 0932  Vitals shown include unvalidated device data.  Last Pain:  Vitals:   10/21/21 0620  TempSrc: Oral  PainSc:          Complications: No notable events documented.

## 2021-10-21 NOTE — Progress Notes (Signed)
Orthopedic Tech Progress Note Patient Details:  Whitney [name] 09/05/1945 [id number]  Ortho Devices Type of Ortho Device: Bone foam zero knee Ortho Device/Splint Location: Left knee Ortho Device/Splint Interventions: Application   Post Interventions Patient Tolerated: Well  [name] [name] 10/21/2021, 11:12 AM

## 2021-10-21 NOTE — Discharge Instructions (Signed)
Ice to the knee constantly.  Keep the incision covered and clean and dry for one week, then ok to get it wet in the shower.  Do exercise as instructed every hour, please to prevent stiffness.    DO NOT prop anything under the knee, it will make your knee stiff.  Prop under the ankle to encourage your knee to go straight.   Use the walker while you are up and around for balance.  Wear your support stockings 24/7 to prevent blood clots and take baby aspirin twice daily for 30 days also to prevent blood clots  Follow up with Dr Trisha Ken in two weeks in the office, call [phone] for appt   INSTRUCTIONS AFTER JOINT REPLACEMENT   Remove items at home which could result in a fall. This includes throw rugs or furniture in walking pathways ICE to the affected joint every three hours while awake for 30 minutes at a time, for at least the first 3-5 days, and then as needed for pain and swelling.  Continue to use ice for pain and swelling. You may notice swelling that will progress down to the foot and ankle.  This is normal after surgery.  Elevate your leg when you are not up walking on it.   Continue to use the breathing machine you got in the hospital (incentive spirometer) which will help keep your temperature down.  It is common for your temperature to cycle up and down following surgery, especially at night when you are not up moving around and exerting yourself.  The breathing machine keeps your lungs expanded and your temperature down.   DIET:  As you were doing prior to hospitalization, we recommend a well-balanced diet.  DRESSING / WOUND CARE / SHOWERING  You may change your dressing 3-5 days after surgery.  Then change the dressing every day with sterile gauze.  Please use good hand washing techniques before changing the dressing.  Do not use any lotions or creams on the incision until instructed by your surgeon.  ACTIVITY  Increase activity slowly as tolerated, but follow the weight  bearing instructions below.   No driving for 6 weeks or until further direction given by your physician.  You cannot drive while taking narcotics.  No lifting or carrying greater than 10 lbs. until further directed by your surgeon. Avoid periods of inactivity such as sitting longer than an hour when not asleep. This helps prevent blood clots.  You may return to work once you are authorized by your doctor.     WEIGHT BEARING   Weight bearing as tolerated with assist device (walker, cane, etc) as directed, use it as long as suggested by your surgeon or therapist, typically at least 4-6 weeks.   EXERCISES  Results after joint replacement surgery are often greatly improved when you follow the exercise, range of motion and muscle strengthening exercises prescribed by your doctor. Safety measures are also important to protect the joint from further injury. Any time any of these exercises cause you to have increased pain or swelling, decrease what you are doing until you are comfortable again and then slowly increase them. If you have problems or questions, call your caregiver or physical therapist for advice.   Rehabilitation is important following a joint replacement. After just a few days of immobilization, the muscles of the leg can become weakened and shrink (atrophy).  These exercises are designed to build up the tone and strength of the thigh and leg muscles and to   improve motion. Often times heat used for twenty to thirty minutes before working out will loosen up your tissues and help with improving the range of motion but do not use heat for the first two weeks following surgery (sometimes heat can increase post-operative swelling).   These exercises can be done on a training (exercise) mat, on the floor, on a table or on a bed. Use whatever works the best and is most comfortable for you.    Use music or television while you are exercising so that the exercises are a pleasant break in your day.  This will make your life better with the exercises acting as a break in your routine that you can look forward to.   Perform all exercises about fifteen times, three times per day or as directed.  You should exercise both the operative leg and the other leg as well.  Exercises include:   Quad Sets - Tighten up the muscle on the front of the thigh (Quad) and hold for 5-10 seconds.   Straight Leg Raises - With your knee straight (if you were given a brace, keep it on), lift the leg to 60 degrees, hold for 3 seconds, and slowly lower the leg.  Perform this exercise against resistance later as your leg gets stronger.  Leg Slides: Lying on your back, slowly slide your foot toward your buttocks, bending your knee up off the floor (only go as far as is comfortable). Then slowly slide your foot back down until your leg is flat on the floor again.  Angel Wings: Lying on your back spread your legs to the side as far apart as you can without causing discomfort.  Hamstring Strength:  Lying on your back, push your heel against the floor with your leg straight by tightening up the muscles of your buttocks.  Repeat, but this time bend your knee to a comfortable angle, and push your heel against the floor.  You may put a pillow under the heel to make it more comfortable if necessary.   A rehabilitation program following joint replacement surgery can speed recovery and prevent re-injury in the future due to weakened muscles. Contact your doctor or a physical therapist for more information on knee rehabilitation.    CONSTIPATION  Constipation is defined medically as fewer than three stools per week and severe constipation as less than one stool per week.  Even if you have a regular bowel pattern at home, your normal regimen is likely to be disrupted due to multiple reasons following surgery.  Combination of anesthesia, postoperative narcotics, change in appetite and fluid intake all can affect your bowels.   YOU MUST  use at least one of the following options; they are listed in order of increasing strength to get the job done.  They are all available over the counter, and you may need to use some, POSSIBLY even all of these options:    Drink plenty of fluids (prune juice may be helpful) and high fiber foods Colace 100 mg by mouth twice a day  Senokot for constipation as directed and as needed Dulcolax (bisacodyl), take with full glass of water  Miralax (polyethylene glycol) once or twice a day as needed.  If you have tried all these things and are unable to have a bowel movement in the first 3-4 days after surgery call either your surgeon or your primary doctor.    If you experience loose stools or diarrhea, hold the medications until you stool forms back   up.  If your symptoms do not get better within 1 week or if they get worse, check with your doctor.  If you experience "the worst abdominal pain ever" or develop nausea or vomiting, please contact the office immediately for further recommendations for treatment.   ITCHING:  If you experience itching with your medications, try taking only a single pain pill, or even half a pain pill at a time.  You can also use Benadryl over the counter for itching or also to help with sleep.   TED HOSE STOCKINGS:  Use stockings on both legs until for at least 2 weeks or as directed by physician office. They may be removed at night for sleeping.  MEDICATIONS:  See your medication summary on the "After Visit Summary" that nursing will review with you.  You may have some home medications which will be placed on hold until you complete the course of blood thinner medication.  It is important for you to complete the blood thinner medication as prescribed.  PRECAUTIONS:  If you experience chest pain or shortness of breath - call 911 immediately for transfer to the hospital emergency department.   If you develop a fever greater that 101 F, purulent drainage from wound, increased  redness or drainage from wound, foul odor from the wound/dressing, or calf pain - CONTACT YOUR SURGEON.                                                   FOLLOW-UP APPOINTMENTS:  If you do not already have a post-op appointment, please call the office for an appointment to be seen by your surgeon.  Guidelines for how soon to be seen are listed in your "After Visit Summary", but are typically between 1-4 weeks after surgery.  OTHER INSTRUCTIONS:   Knee Replacement:  Do not place pillow under knee, focus on keeping the knee straight while resting. CPM instructions: 0-90 degrees, 2 hours in the morning, 2 hours in the afternoon, and 2 hours in the evening. Place foam block, curve side up under heel at all times except when in CPM or when walking.  DO NOT modify, tear, cut, or change the foam block in any way.  POST-OPERATIVE OPIOID TAPER INSTRUCTIONS: It is important to wean off of your opioid medication as soon as possible. If you do not need pain medication after your surgery it is ok to stop day one. Opioids include: Codeine, Hydrocodone(Norco, Vicodin), Oxycodone(Percocet, oxycontin) and hydromorphone amongst others.  Long term and even short term use of opiods can cause: Increased pain response Dependence Constipation Depression Respiratory depression And more.  Withdrawal symptoms can include Flu like symptoms Nausea, vomiting And more Techniques to manage these symptoms Hydrate well Eat regular healthy meals Stay active Use relaxation techniques(deep breathing, meditating, yoga) Do Not substitute Alcohol to help with tapering If you have been on opioids for less than two weeks and do not have pain than it is ok to stop all together.  Plan to wean off of opioids This plan should start within one week post op of your joint replacement. Maintain the same interval or time between taking each dose and first decrease the dose.  Cut the total daily intake of opioids by one tablet each  day Next start to increase the time between doses. The last dose that should be eliminated is   the evening dose.   MAKE SURE YOU:  Understand these instructions.  Get help right away if you are not doing well or get worse.    Thank you for letting us be a part of your medical care team.  It is a privilege we respect greatly.  We hope these instructions will help you stay on track for a fast and full recovery!      

## 2021-10-21 NOTE — Brief Op Note (Signed)
10/21/2021  9:29 AM  PATIENT:  Whitney [name]  76 y.o. female  PRE-OPERATIVE DIAGNOSIS:  left knee osteoarthritis end stage  POST-OPERATIVE DIAGNOSIS:  left knee osteoarthritis end stage  PROCEDURE:  Procedure(s): TOTAL KNEE ARTHROPLASTY (Left) Depuy Attune   SURGEON:  Surgeon(s) and Role:    Netta Cedars, MD - Primary  PHYSICIAN ASSISTANT:   ASSISTANTS: Ventura Bruns, PA-C   ANESTHESIA:   regional and spinal  EBL:  50 mL   BLOOD ADMINISTERED:none  DRAINS: none   LOCAL MEDICATIONS USED:  MARCAINE     SPECIMEN:  No Specimen  DISPOSITION OF SPECIMEN:  N/A  COUNTS:  YES  TOURNIQUET:   Total Tourniquet Time Documented: Thigh (Left) - 86 minutes Total: Thigh (Left) - 86 minutes   DICTATION: .Other Dictation: Dictation Number [id number]  PLAN OF CARE: Admit for overnight observation  PATIENT DISPOSITION:  PACU - hemodynamically stable.   Delay start of Pharmacological VTE agent (>24hrs) due to surgical blood loss or risk of bleeding: no

## 2021-10-21 NOTE — Care Plan (Signed)
Ortho Bundle Case Management Note  Patient Details  Name: Whitney [name] MRN: [id number] Date of Birth: 1946-01-11                  L TKA on 10-21-21  DCP: Home with husband  DME: No needs, has a RW  PT: [facility] on 10-24-21   DME Arranged:  N/A DME Agency:     [facility] Arranged:    [place] Agency:     Additional Comments: Please contact me with any questions of if this plan should need to change.  Marianne Sofia, RN,CCM [facility]  [phone] 10/21/2021, 9:45 AM

## 2021-10-21 NOTE — Evaluation (Signed)
Physical Therapy Evaluation Patient Details Name: Whitney [name] MRN: [id number] DOB: 02/08/1945 Today's Date: 10/21/2021  History of Present Illness  Pt s/p L TKR and with hx of CKD, DM, neuropathy, PTSD, Bil RCR, CVA  Clinical Impression  Pt s/p L TKR and presents with decreased L LE strength/ROM and post op pain limiting functional mobility.  Pt hopes to progress to dc home but reports spouse is physically unable to assist (spouse is bil amputee in [facility]) and would benefit from follow up HHPT to maximize IND and safety at home prior to advancing to OP PT.     Recommendations for follow up therapy are one component of a multi-disciplinary discharge planning process, led by the attending physician.  Recommendations may be updated based on patient status, additional functional criteria and insurance authorization.  Follow Up Recommendations Home health PT      Assistance Recommended at Discharge Frequent or constant Supervision/Assistance  Patient can return home with the following  A little help with bathing/dressing/bathroom;A lot of help with walking and/or transfers;Assistance with cooking/housework;Assist for transportation;Help with stairs or ramp for entrance    Equipment Recommendations None recommended by PT  Recommendations for Other Services       Functional Status Assessment Patient has had a recent decline in their functional status and demonstrates the ability to make significant improvements in function in a reasonable and predictable amount of time.     Precautions / Restrictions Precautions Precautions: Fall Restrictions Weight Bearing Restrictions: No      Mobility  Bed Mobility Overal bed mobility: Needs Assistance Bed Mobility: Supine to Sit     Supine to sit: Min assist, +2 for physical assistance, +2 for safety/equipment     General bed mobility comments: Increased time with cues for sequence and use of R LE to self assist.  Physical assist to  manage LEs and bring trunk to upright    Transfers Overall transfer level: Needs assistance Equipment used: Rolling walker (2 wheels) Transfers: Sit to/from Stand Sit to Stand: Min assist, From elevated surface           General transfer comment: cues for LE management and use of UEs to self assist    Ambulation/Gait Ambulation/Gait assistance: Min assist Gait Distance (Feet): 6 Feet Assistive device: Rolling walker (2 wheels) Gait Pattern/deviations: Step-to pattern, Decreased step length - right, Decreased step length - left, Shuffle, Trunk flexed Gait velocity: decr     General Gait Details: increased time with cues for sequence, posture and position from RW.  Distance ltd by Biomedical scientist Rankin (Stroke Patients Only)       Balance Overall balance assessment: Needs assistance Sitting-balance support: No upper extremity supported, Feet supported Sitting balance-Leahy Scale: Good     Standing balance support: Bilateral upper extremity supported Standing balance-Leahy Scale: Poor                               Pertinent Vitals/Pain Pain Assessment Pain Assessment: 0-10 Pain Score: 6  Pain Location: L knee Pain Descriptors / Indicators: Aching, Sore Pain Intervention(s): Limited activity within patient's tolerance, Monitored during session, Premedicated before session, Ice applied    Home Living Family/patient expects to be discharged to:: Private residence Living Arrangements: Spouse/significant other Available Help at Discharge: Family;Available PRN/intermittently Type of Home: House Home Access: Ramped entrance  Home Layout: One level Home Equipment: Shower seat;Grab bars - toilet;Rolling Environmental consultant (2 wheels)      Prior Function Prior Level of Function : Independent/Modified Independent                     Hand Dominance   Dominant Hand: Right    Extremity/Trunk  Assessment   Upper Extremity Assessment Upper Extremity Assessment: Overall WFL for tasks assessed    Lower Extremity Assessment Lower Extremity Assessment: LLE deficits/detail    Cervical / Trunk Assessment Cervical / Trunk Assessment: Normal  Communication   Communication: No difficulties  Cognition Arousal/Alertness: Awake/alert Behavior During Therapy: WFL for tasks assessed/performed Overall Cognitive Status: Within Functional Limits for tasks assessed                                          General Comments      Exercises Total Joint Exercises Ankle Circles/Pumps: AROM, Both, 15 reps, Supine   Assessment/Plan    PT Assessment Patient needs continued PT services  PT Problem List Decreased strength;Decreased range of motion;Decreased activity tolerance;Decreased balance;Decreased mobility;Decreased knowledge of use of DME;Pain       PT Treatment Interventions DME instruction;Gait training;Functional mobility training;Therapeutic activities;Therapeutic exercise;Balance training;Patient/family education    PT Goals (Current goals can be found in the Care Plan section)  Acute Rehab PT Goals Patient Stated Goal: Regain IND PT Goal Formulation: With patient Time For Goal Achievement: 10/28/21 Potential to Achieve Goals: Good    Frequency 7X/week     Co-evaluation               AM-PAC PT "6 Clicks" Mobility  Outcome Measure Help needed turning from your back to your side while in a flat bed without using bedrails?: A Lot Help needed moving from lying on your back to sitting on the side of a flat bed without using bedrails?: A Lot Help needed moving to and from a bed to a chair (including a wheelchair)?: A Lot Help needed standing up from a chair using your arms (e.g., wheelchair or bedside chair)?: A Lot Help needed to walk in hospital room?: Total Help needed climbing 3-5 steps with a railing? : Total 6 Click Score: 10    End of  Session Equipment Utilized During Treatment: Gait belt;Left knee immobilizer Activity Tolerance: Patient tolerated treatment well Patient left: in chair;with call bell/phone within reach;with chair alarm set Nurse Communication: Mobility status PT Visit Diagnosis: Unsteadiness on feet (R26.81);Difficulty in walking, not elsewhere classified (R26.2);Pain Pain - Right/Left: Left Pain - part of body: Knee    Time: 1550-1620 PT Time Calculation (min) (ACUTE ONLY): 30 min   Charges:   PT Evaluation $PT Eval Low Complexity: 1 Low PT Treatments $Therapeutic Activity: 8-22 mins        Debe Coder PT Acute Rehabilitation Services Pager [phone] Office [phone]   Davy Faught 10/21/2021, 5:04 PM

## 2021-10-22 DIAGNOSIS — I10 Essential (primary) hypertension: Secondary | ICD-10-CM | POA: Diagnosis not present

## 2021-10-22 DIAGNOSIS — E7800 Pure hypercholesterolemia, unspecified: Secondary | ICD-10-CM | POA: Diagnosis not present

## 2021-10-22 DIAGNOSIS — E1169 Type 2 diabetes mellitus with other specified complication: Secondary | ICD-10-CM | POA: Diagnosis not present

## 2021-10-22 LAB — CBC
HCT: 33 % — ABNORMAL LOW (ref 36.0–46.0)
Hemoglobin: 9.9 g/dL — ABNORMAL LOW (ref 12.0–15.0)
MCH: 29.8 pg (ref 26.0–34.0)
MCHC: 30 g/dL (ref 30.0–36.0)
MCV: 99.4 fL (ref 80.0–100.0)
Platelets: 144 10*3/uL — ABNORMAL LOW (ref 150–400)
RBC: 3.32 MIL/uL — ABNORMAL LOW (ref 3.87–5.11)
RDW: 13.9 % (ref 11.5–15.5)
WBC: 8.8 10*3/uL (ref 4.0–10.5)
nRBC: 0 % (ref 0.0–0.2)

## 2021-10-22 LAB — BASIC METABOLIC PANEL
Anion gap: 10 (ref 5–15)
BUN: 33 mg/dL — ABNORMAL HIGH (ref 8–23)
CO2: 21 mmol/L — ABNORMAL LOW (ref 22–32)
Calcium: 8.6 mg/dL — ABNORMAL LOW (ref 8.9–10.3)
Chloride: 109 mmol/L (ref 98–111)
Creatinine, Ser: 1.76 mg/dL — ABNORMAL HIGH (ref 0.44–1.00)
GFR, Estimated: 29 mL/min — ABNORMAL LOW (ref >60)
Glucose, Bld: 313 mg/dL — ABNORMAL HIGH (ref 70–99)
Potassium: 4 mmol/L (ref 3.5–5.1)
Sodium: 140 mmol/L (ref 135–145)

## 2021-10-22 MED ORDER — ENOXAPARIN SODIUM 30 MG/0.3ML IJ SOSY
30.0000 mg | PREFILLED_SYRINGE | Freq: Every day | INTRAMUSCULAR | Status: DC
  Administered 2021-10-22 – 2021-10-28 (×7): 30 mg via SUBCUTANEOUS
  Filled 2021-10-22 (×7): qty 0.3

## 2021-10-22 NOTE — Progress Notes (Signed)
Gerlene Burdock notified of need for home health order for pt. Provider will place order as soon as he can.

## 2021-10-22 NOTE — Plan of Care (Signed)
  Problem: Pain Management: Goal: Pain level will decrease with appropriate interventions Outcome: Progressing   Problem: Skin Integrity: Goal: Will show signs of wound healing Outcome: Progressing   Problem: Clinical Measurements: Goal: Diagnostic test results will improve Outcome: Progressing   Problem: Clinical Measurements: Goal: Respiratory complications will improve Outcome: Progressing   Problem: Clinical Measurements: Goal: Cardiovascular complication will be avoided Outcome: Progressing

## 2021-10-22 NOTE — Progress Notes (Signed)
Physical Therapy Treatment Patient Details Name: Whitney [name] MRN: [id number] DOB: 22-Jan-1946 Today's Date: 10/22/2021   History of Present Illness Pt s/p L TKR and with hx of CKD, DM, neuropathy, PTSD, Bil RCR, CVA    PT Comments    Pt continue very cooperative and steady progress with mobility.  Pt hopeful for dc home Monday.   Recommendations for follow up therapy are one component of a multi-disciplinary discharge planning process, led by the attending physician.  Recommendations may be updated based on patient status, additional functional criteria and insurance authorization.  Follow Up Recommendations  Home health PT     Assistance Recommended at Discharge Frequent or constant Supervision/Assistance  Patient can return home with the following A little help with bathing/dressing/bathroom;A lot of help with walking and/or transfers;Assistance with cooking/housework;Assist for transportation;Help with stairs or ramp for entrance   Equipment Recommendations  None recommended by PT    Recommendations for Other Services       Precautions / Restrictions Precautions Precautions: Fall Restrictions Weight Bearing Restrictions: No     Mobility  Bed Mobility Overal bed mobility: Needs Assistance Bed Mobility: Supine to Sit, Sit to Supine     Supine to sit: Min assist Sit to supine: Min assist   General bed mobility comments: Increased time with cues for sequence and use of R LE to self assist.  Physical assist to manage LEs and bring trunk to upright    Transfers Overall transfer level: Needs assistance Equipment used: Rolling walker (2 wheels) Transfers: Sit to/from Stand Sit to Stand: Min assist, From elevated surface           General transfer comment: cues for LE management and use of UEs to self assist; physical assist to bring wt up and fwd and balance in initial standing.    Ambulation/Gait Ambulation/Gait assistance: Min assist Gait Distance  (Feet): 52 Feet (twice) Assistive device: Rolling walker (2 wheels) Gait Pattern/deviations: Step-to pattern, Decreased step length - right, Decreased step length - left, Shuffle, Trunk flexed Gait velocity: decr     General Gait Details: increased time with cues for sequence, posture, and position from RW.  Physical assist for balance and RW management.  Distance ltd by fatigue   Stairs             Wheelchair Mobility    Modified Rankin (Stroke Patients Only)       Balance Overall balance assessment: Needs assistance Sitting-balance support: No upper extremity supported, Feet supported Sitting balance-Leahy Scale: Good     Standing balance support: Bilateral upper extremity supported Standing balance-Leahy Scale: Poor                              Cognition Arousal/Alertness: Awake/alert Behavior During Therapy: WFL for tasks assessed/performed Overall Cognitive Status: Within Functional Limits for tasks assessed                                          Exercises Total Joint Exercises Ankle Circles/Pumps: AROM, Both, 15 reps, Supine Quad Sets: AROM, Both, 10 reps, Supine Heel Slides: AAROM, Left, 15 reps, Supine Straight Leg Raises: AAROM, Left, 10 reps, Supine Goniometric ROM: AAROM L knee -5 - 60    General Comments        Pertinent Vitals/Pain Pain Assessment Pain Assessment: 0-10 Pain Score: 3  Pain  Location: L knee Pain Descriptors / Indicators: Aching, Sore Pain Intervention(s): Limited activity within patient's tolerance, Monitored during session, Premedicated before session    Home Living                          Prior Function            PT Goals (current goals can now be found in the care plan section) Acute Rehab PT Goals Patient Stated Goal: Regain IND PT Goal Formulation: With patient Time For Goal Achievement: 10/28/21 Potential to Achieve Goals: Good Progress towards PT goals: Progressing  toward goals    Frequency    7X/week      PT Plan Current plan remains appropriate    Co-evaluation              AM-PAC PT "6 Clicks" Mobility   Outcome Measure  Help needed turning from your back to your side while in a flat bed without using bedrails?: A Lot Help needed moving from lying on your back to sitting on the side of a flat bed without using bedrails?: A Little Help needed moving to and from a bed to a chair (including a wheelchair)?: A Lot Help needed standing up from a chair using your arms (e.g., wheelchair or bedside chair)?: A Little Help needed to walk in hospital room?: A Little Help needed climbing 3-5 steps with a railing? : Total 6 Click Score: 14    End of Session Equipment Utilized During Treatment: Gait belt;Left knee immobilizer Activity Tolerance: Patient tolerated treatment well Patient left: with call bell/phone within reach;in bed;with family/visitor present Nurse Communication: Mobility status PT Visit Diagnosis: Unsteadiness on feet (R26.81);Difficulty in walking, not elsewhere classified (R26.2);Pain Pain - Right/Left: Left Pain - part of body: Knee     Time: 1342-1415 PT Time Calculation (min) (ACUTE ONLY): 33 min  Charges:  $Gait Training: 23-37 mins $Therapeutic Exercise: 8-22 mins $Therapeutic Activity: 8-22 mins                     Debe Coder PT Acute Rehabilitation Services Pager [phone] Office [phone]    [name] 10/22/2021, 2:23 PM

## 2021-10-22 NOTE — Progress Notes (Signed)
Pt was able to self inject with Lovenox with teaching from RN. Pt did well with this. Family at bedside.

## 2021-10-22 NOTE — TOC Progression Note (Signed)
Transition of Care [facility]) - Progression Note    Patient Details  Name: Whitney [name] MRN: [id number] Date of Birth: [birth year]  [facility] CM/SW Contact  Ross Ludwig, [place] Phone Number: 10/22/2021, 6:06 PM  Clinical Narrative:     Patient wants home with home health.  Patient is an ortho bundle per TOC note from yesterday Lucy left message for Renee from [facility] to review about letting patient have [facility] PT verse outpatient which was prearranged at physician's office.  CSW spoke to [facility] they do not service [place] where patient will be living.  CSW spoke to [place] at [place] about arranging home health PT, she said she spoke to [place], and he left a message with Joseph Art to see if patient can be approved for [facility] PT.  CSW continuing to follow patient's progress throughout discharge planning.     Barriers to Discharge: No Barriers Identified  Expected Discharge Plan and Services                           DME Arranged: N/A DME Agency: NA                   Social Determinants of Health (SDOH) Interventions    Readmission Risk Interventions     No data to display

## 2021-10-22 NOTE — Progress Notes (Signed)
   Subjective:  Patient reports pain as mild to moderate.  Doing well this morning.  No specific complaints.  She is concerned about going home with her double amputee husband as her only Environmental consultant.  She is worried that she may need home health therapy.  She does also tell me that she is not supposed to be taking aspirin currently as her nephrologist had told her to discontinue.  Objective:   VITALS:   Vitals:   10/21/21 2136 10/22/21 0116 10/22/21 0557 10/22/21 1008  BP: (!) 121/55 (!) 111/52 (!) 129/56 (!) 125/58  Pulse: 79 77 72 75  Resp: '18 18 18 18  '$ Temp: 98 F (36.7 C) 97.6 F (36.4 C) 98.1 F (36.7 C) 98.4 F (36.9 C)  TempSrc: Oral Oral Oral Oral  SpO2: 98% 95% 96% 93%  Weight:      Height:        Neurovascular intact Sensation intact distally Intact pulses distally Dorsiflexion/Plantar flexion intact Incision: dressing C/D/I and no drainage Compartment soft   Lab Results  Component Value Date   WBC 8.8 10/22/2021   HGB 9.9 (L) 10/22/2021   HCT 33.0 (L) 10/22/2021   MCV 99.4 10/22/2021   PLT 144 (L) 10/22/2021   BMET    Component Value Date/Time   NA 140 10/22/2021 0355   K 4.0 10/22/2021 0355   CL 109 10/22/2021 0355   CO2 21 (L) 10/22/2021 0355   GLUCOSE 313 (H) 10/22/2021 0355   BUN 33 (H) 10/22/2021 0355   CREATININE 1.76 (H) 10/22/2021 0355   CALCIUM 8.6 (L) 10/22/2021 0355   GFRNONAA 29 (L) 10/22/2021 0355     Assessment/Plan: 1 Day Post-Op   Principal Problem:   H/O total knee replacement, left   Advance diet Up with therapy -Weightbearing as tolerated to the left lower extremity  -Continue inpatient care at this juncture for therapy and pain management.  -Potential discharge home Monday with outpatient therapy versus home health therapy.  We will start the process of approving for home health therapy  -Also will need to discuss with Dr. Veverly Fells his preference for DVT prophylaxis given that the patient is stating that she is been  advised to not take aspirin.  We will place her on Lovenox for now while she is inpatient.   Nicholes Stairs 10/22/2021, 11:19 AM   Geralynn Rile, MD [phone]

## 2021-10-22 NOTE — Progress Notes (Signed)
Physical Therapy Treatment Patient Details Name: Whitney [name] MRN: [id number] DOB: 12-16-1945 Today's Date: 10/22/2021   History of Present Illness Pt s/p L TKR and with hx of CKD, DM, neuropathy, PTSD, Bil RCR, CVA    PT Comments    Pt continues very cooperative and progressing steadily with mobility but fatigues easily and requiring assist for balance - pt reports hx of multiple falls in last 6 months.   Recommendations for follow up therapy are one component of a multi-disciplinary discharge planning process, led by the attending physician.  Recommendations may be updated based on patient status, additional functional criteria and insurance authorization.  Follow Up Recommendations  Home health PT     Assistance Recommended at Discharge Frequent or constant Supervision/Assistance  Patient can return home with the following A little help with bathing/dressing/bathroom;A lot of help with walking and/or transfers;Assistance with cooking/housework;Assist for transportation;Help with stairs or ramp for entrance   Equipment Recommendations  None recommended by PT    Recommendations for Other Services       Precautions / Restrictions Precautions Precautions: Fall Restrictions Weight Bearing Restrictions: No     Mobility  Bed Mobility Overal bed mobility: Needs Assistance Bed Mobility: Supine to Sit     Supine to sit: Min assist     General bed mobility comments: Increased time with cues for sequence and use of R LE to self assist.  Physical assist to manage LEs and bring trunk to upright    Transfers Overall transfer level: Needs assistance Equipment used: Rolling walker (2 wheels) Transfers: Sit to/from Stand Sit to Stand: Min assist, Mod assist, From elevated surface           General transfer comment: cues for LE management and use of UEs to self assist; physical assist to bring wt up and fwd and balance in initial standing.     Ambulation/Gait Ambulation/Gait assistance: Min assist Gait Distance (Feet): 52 Feet Assistive device: Rolling walker (2 wheels) Gait Pattern/deviations: Step-to pattern, Decreased step length - right, Decreased step length - left, Shuffle, Trunk flexed Gait velocity: decr     General Gait Details: increased time with cues for sequence, posture, and position from RW.  Physical assist for balance and RW management.  Distance ltd by fatigue   Stairs             Wheelchair Mobility    Modified Rankin (Stroke Patients Only)       Balance Overall balance assessment: Needs assistance Sitting-balance support: No upper extremity supported, Feet supported Sitting balance-Leahy Scale: Good     Standing balance support: Bilateral upper extremity supported Standing balance-Leahy Scale: Poor                              Cognition Arousal/Alertness: Awake/alert Behavior During Therapy: WFL for tasks assessed/performed Overall Cognitive Status: Within Functional Limits for tasks assessed                                          Exercises Total Joint Exercises Ankle Circles/Pumps: AROM, Both, 15 reps, Supine Quad Sets: AROM, Both, 10 reps, Supine Heel Slides: AAROM, Left, 15 reps, Supine Straight Leg Raises: AAROM, Left, 10 reps, Supine Goniometric ROM: AAROM L knee -5 - 60    General Comments        Pertinent Vitals/Pain Pain Assessment Pain  Assessment: 0-10 Pain Score: 5  Pain Location: L knee Pain Descriptors / Indicators: Aching, Sore Pain Intervention(s): Limited activity within patient's tolerance, Monitored during session, Premedicated before session, Ice applied    Home Living                          Prior Function            PT Goals (current goals can now be found in the care plan section) Acute Rehab PT Goals Patient Stated Goal: Regain IND PT Goal Formulation: With patient Time For Goal Achievement:  10/28/21 Potential to Achieve Goals: Good Progress towards PT goals: Progressing toward goals    Frequency    7X/week      PT Plan Current plan remains appropriate    Co-evaluation              AM-PAC PT "6 Clicks" Mobility   Outcome Measure  Help needed turning from your back to your side while in a flat bed without using bedrails?: A Lot Help needed moving from lying on your back to sitting on the side of a flat bed without using bedrails?: A Lot Help needed moving to and from a bed to a chair (including a wheelchair)?: A Lot Help needed standing up from a chair using your arms (e.g., wheelchair or bedside chair)?: A Lot Help needed to walk in hospital room?: A Little Help needed climbing 3-5 steps with a railing? : Total 6 Click Score: 12    End of Session Equipment Utilized During Treatment: Gait belt;Left knee immobilizer Activity Tolerance: Patient tolerated treatment well Patient left: in chair;with call bell/phone within reach;with chair alarm set Nurse Communication: Mobility status PT Visit Diagnosis: Unsteadiness on feet (R26.81);Difficulty in walking, not elsewhere classified (R26.2);Pain Pain - Right/Left: Left Pain - part of body: Knee     Time: 2536-6440 PT Time Calculation (min) (ACUTE ONLY): 49 min  Charges:  $Gait Training: 8-22 mins $Therapeutic Exercise: 8-22 mins $Therapeutic Activity: 8-22 mins                     Debe Coder PT Acute Rehabilitation Services Pager [phone] Office [phone]    Whitney [name] 10/22/2021, 12:44 PM

## 2021-10-23 LAB — CBC
HCT: 31 % — ABNORMAL LOW (ref 36.0–46.0)
Hemoglobin: 9.6 g/dL — ABNORMAL LOW (ref 12.0–15.0)
MCH: 29.9 pg (ref 26.0–34.0)
MCHC: 31 g/dL (ref 30.0–36.0)
MCV: 96.6 fL (ref 80.0–100.0)
Platelets: 148 10*3/uL — ABNORMAL LOW (ref 150–400)
RBC: 3.21 MIL/uL — ABNORMAL LOW (ref 3.87–5.11)
RDW: 14.3 % (ref 11.5–15.5)
WBC: 8.1 10*3/uL (ref 4.0–10.5)
nRBC: 0 % (ref 0.0–0.2)

## 2021-10-23 LAB — GLUCOSE, CAPILLARY: Glucose-Capillary: 195 mg/dL — ABNORMAL HIGH (ref 70–99)

## 2021-10-23 NOTE — Progress Notes (Signed)
Physical Therapy Treatment Patient Details Name: Whitney [name] MRN: [id number] DOB: 1945/12/13 Today's Date: 10/23/2021   History of Present Illness Pt s/p L TKR and with hx of CKD, DM, neuropathy, PTSD, Bil RCR, CVA    PT Comments    Pt performed therex program with increased time, assistance and cues to gain participation.  Mobility deferred 2* pt lethargy and difficulty processing and following cues.  Will follow.  Recommendations for follow up therapy are one component of a multi-disciplinary discharge planning process, led by the attending physician.  Recommendations may be updated based on patient status, additional functional criteria and insurance authorization.  Follow Up Recommendations  Home health PT     Assistance Recommended at Discharge Frequent or constant Supervision/Assistance  Patient can return home with the following A little help with bathing/dressing/bathroom;A lot of help with walking and/or transfers;Assistance with cooking/housework;Assist for transportation;Help with stairs or ramp for entrance   Equipment Recommendations  None recommended by PT    Recommendations for Other Services       Precautions / Restrictions Precautions Precautions: Fall Restrictions Weight Bearing Restrictions: No Other Position/Activity Restrictions: WBAT     Mobility  Bed Mobility               General bed mobility comments: OOB deferred 2* pt's current cognitive state with difficulty processing and following through with cues    Transfers                        Ambulation/Gait                   Stairs             Wheelchair Mobility    Modified Rankin (Stroke Patients Only)       Balance                                            Cognition Arousal/Alertness: Lethargic, Suspect due to medications Behavior During Therapy: Flat affect Overall Cognitive Status: Impaired/Different from baseline Area of  Impairment: Attention, Following commands                       Following Commands: Follows one step commands inconsistently, Follows one step commands with increased time       General Comments: Slow processing and intermittent follow through        Exercises Total Joint Exercises Ankle Circles/Pumps: Both, 15 reps, Supine, AAROM Quad Sets: Both, Supine, AAROM, 5 reps Heel Slides: AAROM, Left, 15 reps, Supine Straight Leg Raises: AAROM, Left, 10 reps, Supine Goniometric ROM: AAROM L knee -5 - 45    General Comments        Pertinent Vitals/Pain Pain Assessment Pain Assessment: Faces Faces Pain Scale: Hurts little more Pain Location: L knee Pain Descriptors / Indicators: Grimacing Pain Intervention(s): Limited activity within patient's tolerance, Monitored during session, Premedicated before session, Ice applied    Home Living                          Prior Function            PT Goals (current goals can now be found in the care plan section) Acute Rehab PT Goals Patient Stated Goal: Regain IND PT Goal Formulation: With patient Time For Goal  Achievement: 10/28/21 Potential to Achieve Goals: Good Progress towards PT goals: Not progressing toward goals - comment (lethargic - ? meds)    Frequency    7X/week      PT Plan Current plan remains appropriate    Co-evaluation              AM-PAC PT "6 Clicks" Mobility   Outcome Measure  Help needed turning from your back to your side while in a flat bed without using bedrails?: A Lot Help needed moving from lying on your back to sitting on the side of a flat bed without using bedrails?: Total Help needed moving to and from a bed to a chair (including a wheelchair)?: Total Help needed standing up from a chair using your arms (e.g., wheelchair or bedside chair)?: Total Help needed to walk in hospital room?: Total Help needed climbing 3-5 steps with a railing? : Total 6 Click Score: 7     End of Session   Activity Tolerance: Patient limited by lethargy Patient left: in bed;with call bell/phone within reach;with bed alarm set Nurse Communication: Mobility status PT Visit Diagnosis: Unsteadiness on feet (R26.81);Difficulty in walking, not elsewhere classified (R26.2);Pain Pain - Right/Left: Left Pain - part of body: Knee     Time: 1914-7829 PT Time Calculation (min) (ACUTE ONLY): 19 min  Charges:  $Therapeutic Exercise: 8-22 mins                     [place] Pager [phone] Office [phone]    Whitney [name] 10/23/2021, 12:30 PM

## 2021-10-23 NOTE — Plan of Care (Signed)
  Problem: Education: Goal: Knowledge of the prescribed therapeutic regimen will improve Outcome: Progressing   Problem: Pain Management: Goal: Pain level will decrease with appropriate interventions Outcome: Progressing   Problem: Safety: Goal: Ability to remain free from injury will improve Outcome: Progressing   Problem: Skin Integrity: Goal: Risk for impaired skin integrity will decrease Outcome: Progressing

## 2021-10-23 NOTE — Progress Notes (Signed)
Physical Therapy Treatment Patient Details Name: Whitney [name] MRN: [id number] DOB: [birth year] Today's Date: 10/23/2021   History of Present Illness Pt s/p L TKR and with hx of CKD, DM, neuropathy, PTSD, Bil RCR, CVA    PT Comments    Pt level of alertness continues to slowly improve but not yet at baseline and pt continues to struggle with processing cues and following through.  Pt assisted to stand from chair and to ambulate very short distance to return to bed.  RN aware   Recommendations for follow up therapy are one component of a multi-disciplinary discharge planning process, led by the attending physician.  Recommendations may be updated based on patient status, additional functional criteria and insurance authorization.  Follow Up Recommendations  Home health PT     Assistance Recommended at Discharge Frequent or constant Supervision/Assistance  Patient can return home with the following A little help with bathing/dressing/bathroom;A lot of help with walking and/or transfers;Assistance with cooking/housework;Assist for transportation;Help with stairs or ramp for entrance   Equipment Recommendations  None recommended by PT    Recommendations for Other Services       Precautions / Restrictions Precautions Precautions: Fall Restrictions Weight Bearing Restrictions: No Other Position/Activity Restrictions: WBAT     Mobility  Bed Mobility Overal bed mobility: Needs Assistance Bed Mobility: Sit to Supine     Supine to sit: Min assist, Mod assist, +2 for safety/equipment Sit to supine: Mod assist, +2 for physical assistance, +2 for safety/equipment   General bed mobility comments: increased time with cues for sequence, and use of UEs to self assist.  Physical assist to manage L LE, to control trunk and to complete rotation with use of bed pad    Transfers Overall transfer level: Needs assistance Equipment used: Rolling walker (2 wheels) Transfers: Sit to/from  Stand Sit to Stand: Min assist, Mod assist, +2 safety/equipment, From elevated surface           General transfer comment: cues for LE management and use of UEs to self assist; physical assist to bring wt up and fwd and balance in initial standing.    Ambulation/Gait Ambulation/Gait assistance: Min assist, Mod assist, +2 safety/equipment Gait Distance (Feet): 3 Feet Assistive device: Rolling walker (2 wheels) Gait Pattern/deviations: Step-to pattern, Decreased step length - right, Decreased step length - left, Shuffle, Trunk flexed Gait velocity: decr     General Gait Details: increased time with cues for sequence, posture, and position from RW.  Physical assist for balance and RW management.  Distance ltd by fatigue   Stairs             Wheelchair Mobility    Modified Rankin (Stroke Patients Only)       Balance Overall balance assessment: Needs assistance Sitting-balance support: Feet supported, Bilateral upper extremity supported Sitting balance-Leahy Scale: Poor   Postural control: Posterior lean Standing balance support: Bilateral upper extremity supported Standing balance-Leahy Scale: Poor                              Cognition Arousal/Alertness: Lethargic, Suspect due to medications Behavior During Therapy: Flat affect Overall Cognitive Status: Impaired/Different from baseline Area of Impairment: Attention, Following commands                       Following Commands: Follows one step commands with increased time       General Comments: Slow processing  Exercises Total Joint Exercises Ankle Circles/Pumps: Both, 15 reps, Supine, AAROM Quad Sets: Both, Supine, AAROM, 5 reps Heel Slides: AAROM, Left, 15 reps, Supine Straight Leg Raises: AAROM, Left, 10 reps, Supine Goniometric ROM: AAROM L knee -5 - 45    General Comments        Pertinent Vitals/Pain Pain Assessment Pain Assessment: Faces Faces Pain Scale: Hurts  little more Pain Location: L knee Pain Descriptors / Indicators: Grimacing Pain Intervention(s): Limited activity within patient's tolerance, Monitored during session, Ice applied    Home Living                          Prior Function            PT Goals (current goals can now be found in the care plan section) Acute Rehab PT Goals Patient Stated Goal: Regain IND PT Goal Formulation: With patient Time For Goal Achievement: 10/28/21 Potential to Achieve Goals: Good Progress towards PT goals: Progressing toward goals    Frequency    7X/week      PT Plan Current plan remains appropriate    Co-evaluation              AM-PAC PT "6 Clicks" Mobility   Outcome Measure  Help needed turning from your back to your side while in a flat bed without using bedrails?: A Lot Help needed moving from lying on your back to sitting on the side of a flat bed without using bedrails?: A Lot Help needed moving to and from a bed to a chair (including a wheelchair)?: A Lot Help needed standing up from a chair using your arms (e.g., wheelchair or bedside chair)?: A Lot Help needed to walk in hospital room?: Total Help needed climbing 3-5 steps with a railing? : Total 6 Click Score: 10    End of Session Equipment Utilized During Treatment: Gait belt;Left knee immobilizer Activity Tolerance: Patient limited by lethargy Patient left: in bed;with call bell/phone within reach;with bed alarm set Nurse Communication: Mobility status PT Visit Diagnosis: Unsteadiness on feet (R26.81);Difficulty in walking, not elsewhere classified (R26.2);Pain Pain - Right/Left: Left Pain - part of body: Knee     Time: 1410-1433 PT Time Calculation (min) (ACUTE ONLY): 23 min  Charges:  $Gait Training: 8-22 mins $Therapeutic Activity: 8-22 mins                     [place] Pager [phone] Office [phone]    Whitney [name] 10/23/2021, 4:25  PM

## 2021-10-23 NOTE — Plan of Care (Signed)
  Problem: Pain Management: Goal: Pain level will decrease with appropriate interventions Outcome: Progressing   Problem: Skin Integrity: Goal: Will show signs of wound healing Outcome: Progressing   Problem: Clinical Measurements: Goal: Respiratory complications will improve Outcome: Progressing   Problem: Clinical Measurements: Goal: Cardiovascular complication will be avoided Outcome: Progressing

## 2021-10-23 NOTE — TOC Progression Note (Addendum)
Transition of Care [facility]) - Progression Note    Patient Details  Name: Whitney [name] MRN: [id number] Date of Birth: 06-15-1945  [facility] CM/SW Contact  Ross Ludwig, [place] Phone Number: 10/23/2021, 9:36 AM  Clinical Narrative:     CSW received phone call from [place] [facility] [place] they can accept patient for home health PT.  CSW updated patient, she is aware that [place] will call her to set up the first home health visit.   Patient still on oxygen, per [facility], they are trying to ween patient off of oxygen.  CSW asked nurse if she could qualify patient in case she needs oxygen.  CSW updated Olivia Mackie PA and made her aware that patient's home health has been approved and set up.     Barriers to Discharge: No Barriers Identified  Expected Discharge Plan and Services                           DME Arranged: N/A DME Agency: NA                   Social Determinants of Health (SDOH) Interventions    Readmission Risk Interventions     No data to display

## 2021-10-23 NOTE — Progress Notes (Signed)
Physical Therapy Treatment Patient Details Name: Whitney [name] MRN: [id number] DOB: [birth year] Today's Date: 10/23/2021   History of Present Illness Pt s/p L TKR and with hx of CKD, DM, neuropathy, PTSD, Bil RCR, CVA    PT Comments    Pt more alert but not yet at baseline and continues to struggle with processing cues and following through.  Pt assisted to sitting EOB, to stand and to ambulate very short distance to sit up in chair.  RN aware   Recommendations for follow up therapy are one component of a multi-disciplinary discharge planning process, led by the attending physician.  Recommendations may be updated based on patient status, additional functional criteria and insurance authorization.  Follow Up Recommendations  Home health PT     Assistance Recommended at Discharge Frequent or constant Supervision/Assistance  Patient can return home with the following A little help with bathing/dressing/bathroom;A lot of help with walking and/or transfers;Assistance with cooking/housework;Assist for transportation;Help with stairs or ramp for entrance   Equipment Recommendations  None recommended by PT    Recommendations for Other Services       Precautions / Restrictions Precautions Precautions: Fall Restrictions Weight Bearing Restrictions: No Other Position/Activity Restrictions: WBAT     Mobility  Bed Mobility Overal bed mobility: Needs Assistance Bed Mobility: Supine to Sit     Supine to sit: Min assist, Mod assist, +2 for safety/equipment     General bed mobility comments: increased time with cues for sequence, and use of UEs to self assist.  Physical assist to manage L LE, to control trunk and to complete rotation to EOB sitting with use of bed pad    Transfers Overall transfer level: Needs assistance Equipment used: Rolling walker (2 wheels) Transfers: Sit to/from Stand Sit to Stand: Min assist, Mod assist, +2 safety/equipment, From elevated surface            General transfer comment: cues for LE management and use of UEs to self assist; physical assist to bring wt up and fwd and balance in initial standing.    Ambulation/Gait Ambulation/Gait assistance: Min assist, Mod assist, +2 safety/equipment Gait Distance (Feet): 3 Feet Assistive device: Rolling walker (2 wheels) Gait Pattern/deviations: Step-to pattern, Decreased step length - right, Decreased step length - left, Shuffle, Trunk flexed Gait velocity: decr     General Gait Details: increased time with cues for sequence, posture, and position from RW.  Physical assist for balance and RW management.  Distance ltd by fatigue   Stairs             Wheelchair Mobility    Modified Rankin (Stroke Patients Only)       Balance Overall balance assessment: Needs assistance Sitting-balance support: Feet supported, Bilateral upper extremity supported Sitting balance-Leahy Scale: Poor   Postural control: Posterior lean Standing balance support: Bilateral upper extremity supported Standing balance-Leahy Scale: Poor                              Cognition Arousal/Alertness: Lethargic, Suspect due to medications Behavior During Therapy: Flat affect Overall Cognitive Status: Impaired/Different from baseline Area of Impairment: Attention, Following commands                       Following Commands: Follows one step commands with increased time       General Comments: Slow processing        Exercises Total Joint Exercises Ankle Circles/Pumps: Both,  15 reps, Supine, AAROM Quad Sets: Both, Supine, AAROM, 5 reps Heel Slides: AAROM, Left, 15 reps, Supine Straight Leg Raises: AAROM, Left, 10 reps, Supine Goniometric ROM: AAROM L knee -5 - 45    General Comments        Pertinent Vitals/Pain Pain Assessment Pain Assessment: Faces Faces Pain Scale: Hurts little more Pain Location: L knee Pain Descriptors / Indicators: Grimacing Pain  Intervention(s): Limited activity within patient's tolerance, Monitored during session, Premedicated before session, Ice applied    Home Living                          Prior Function            PT Goals (current goals can now be found in the care plan section) Acute Rehab PT Goals Patient Stated Goal: Regain IND PT Goal Formulation: With patient Time For Goal Achievement: 10/28/21 Potential to Achieve Goals: Good Progress towards PT goals: Progressing toward goals    Frequency    7X/week      PT Plan Current plan remains appropriate    Co-evaluation              AM-PAC PT "6 Clicks" Mobility   Outcome Measure  Help needed turning from your back to your side while in a flat bed without using bedrails?: A Lot Help needed moving from lying on your back to sitting on the side of a flat bed without using bedrails?: A Lot Help needed moving to and from a bed to a chair (including a wheelchair)?: A Lot Help needed standing up from a chair using your arms (e.g., wheelchair or bedside chair)?: A Lot Help needed to walk in hospital room?: Total Help needed climbing 3-5 steps with a railing? : Total 6 Click Score: 10    End of Session Equipment Utilized During Treatment: Gait belt;Left knee immobilizer Activity Tolerance: Patient limited by lethargy Patient left: in chair;with call bell/phone within reach;with chair alarm set Nurse Communication: Mobility status PT Visit Diagnosis: Unsteadiness on feet (R26.81);Difficulty in walking, not elsewhere classified (R26.2);Pain Pain - Right/Left: Left Pain - part of body: Knee     Time: 1142-1202 PT Time Calculation (min) (ACUTE ONLY): 20 min  Charges:  $Therapeutic Exercise: 8-22 mins $Therapeutic Activity: 8-22 mins                     Debe Coder PT Acute Rehabilitation Services Pager [phone] Office [phone]    Whitney [name] 10/23/2021, 12:41 PM

## 2021-10-23 NOTE — Progress Notes (Signed)
Whitney [name]  MRN: [id number] DOB/Age: 1945/09/16 76 y.o. [facility] Procedure: Procedure(s) (LRB): TOTAL KNEE ARTHROPLASTY (Left)     Subjective: Painful as expected, still on O2, not on O2 preoperatively.   Vital Signs Temp:  [98.4 F (36.9 C)] 98.4 F (36.9 C) (10/01 0551) Pulse Rate:  [75-80] 77 (10/01 0551) Resp:  [15-18] 15 (10/01 0551) BP: (125-167)/(43-68) 167/68 (10/01 0551) SpO2:  [91 %-93 %] 91 % (10/01 0551)  Lab Results Recent Labs    10/22/21 0355 10/23/21 0410  WBC 8.8 8.1  HGB 9.9* 9.6*  HCT 33.0* 31.0*  PLT 144* 148*   BMET Recent Labs    10/22/21 0355  NA 140  K 4.0  CL 109  CO2 21*  GLUCOSE 313*  BUN 33*  CREATININE 1.76*  CALCIUM 8.6*   No results found for: "INR"   Exam Dressing changed and aqaucel applied, incision clean and dry  NVI        Plan BSIS encouraged and placed within Whitney [name] reach Continue to mobilize Home health needs to be addressed along with ASA as outpatient? By Dr. Harvin Hazel tomorrow Hopeful for DC home tomorrow  Jenetta Loges PA-C  10/23/2021, 9:16 AM Contact # [phone]

## 2021-10-23 NOTE — Progress Notes (Signed)
Pt reported to Rn earlier in afternoon that she has trouble swallowing food and fluids at home. Rn noticed that pt had a difficult time swallowing pills but no apparent signs of aspiration. Pt stated this swallowing issue has led to poor po intake at home at times. Pt remains stable. Rn will continue to monitor.

## 2021-10-24 ENCOUNTER — Encounter (HOSPITAL_COMMUNITY): Payer: Self-pay | Admitting: Orthopedic Surgery

## 2021-10-24 DIAGNOSIS — K219 Gastro-esophageal reflux disease without esophagitis: Secondary | ICD-10-CM | POA: Diagnosis present

## 2021-10-24 DIAGNOSIS — Z881 Allergy status to other antibiotic agents status: Secondary | ICD-10-CM | POA: Diagnosis not present

## 2021-10-24 DIAGNOSIS — N184 Chronic kidney disease, stage 4 (severe): Secondary | ICD-10-CM | POA: Diagnosis present

## 2021-10-24 DIAGNOSIS — M1712 Unilateral primary osteoarthritis, left knee: Secondary | ICD-10-CM | POA: Diagnosis present

## 2021-10-24 DIAGNOSIS — E1140 Type 2 diabetes mellitus with diabetic neuropathy, unspecified: Secondary | ICD-10-CM | POA: Diagnosis present

## 2021-10-24 DIAGNOSIS — Z8673 Personal history of transient ischemic attack (TIA), and cerebral infarction without residual deficits: Secondary | ICD-10-CM | POA: Diagnosis not present

## 2021-10-24 DIAGNOSIS — I129 Hypertensive chronic kidney disease with stage 1 through stage 4 chronic kidney disease, or unspecified chronic kidney disease: Secondary | ICD-10-CM | POA: Diagnosis present

## 2021-10-24 DIAGNOSIS — Z7982 Long term (current) use of aspirin: Secondary | ICD-10-CM | POA: Diagnosis not present

## 2021-10-24 DIAGNOSIS — Z85828 Personal history of other malignant neoplasm of skin: Secondary | ICD-10-CM | POA: Diagnosis not present

## 2021-10-24 DIAGNOSIS — Z79899 Other long term (current) drug therapy: Secondary | ICD-10-CM | POA: Diagnosis not present

## 2021-10-24 DIAGNOSIS — Z794 Long term (current) use of insulin: Secondary | ICD-10-CM | POA: Diagnosis not present

## 2021-10-24 DIAGNOSIS — Z885 Allergy status to narcotic agent status: Secondary | ICD-10-CM | POA: Diagnosis not present

## 2021-10-24 DIAGNOSIS — Z7901 Long term (current) use of anticoagulants: Secondary | ICD-10-CM | POA: Diagnosis not present

## 2021-10-24 DIAGNOSIS — Z751 Person awaiting admission to adequate facility elsewhere: Secondary | ICD-10-CM | POA: Diagnosis not present

## 2021-10-24 DIAGNOSIS — F4310 Post-traumatic stress disorder, unspecified: Secondary | ICD-10-CM | POA: Diagnosis present

## 2021-10-24 DIAGNOSIS — E1122 Type 2 diabetes mellitus with diabetic chronic kidney disease: Secondary | ICD-10-CM | POA: Diagnosis present

## 2021-10-24 NOTE — NC FL2 (Signed)
[place] LEVEL OF CARE SCREENING TOOL     IDENTIFICATION  Patient Name: Whitney [name] Birthdate: [birth year] Sex: female Admission Date (Current Location): 10/21/2021  [facility] and [place] Number:  Herbalist and Address:  [facility],  [place], [place]      Provider Number: [id number]  Attending Physician Name and Address:  Netta Cedars, MD  Relative Name and Phone Number:  [name], [name] [phone]  [phone]  [name], [name]   [phone]    Current Level of Care: Hospital Recommended Level of Care: [place] Prior Approval Number:    Date Approved/Denied:   PASRR Number: Pending  Discharge Plan: SNF    Current Diagnoses: Patient Active Problem List   Diagnosis Date Noted   H/O total knee replacement, left 10/21/2021   RCT (rotator cuff tear) 11/28/2013   Rotator cuff (capsule) sprain 01/31/2013    Orientation RESPIRATION BLADDER Height & Weight     Self, Time, Situation, Place  Normal Continent Weight: 187 lb 15.8 oz (85.3 kg) Height:  '5\' 2"'$  (157.5 cm)  BEHAVIORAL SYMPTOMS/MOOD NEUROLOGICAL BOWEL NUTRITION STATUS      Continent Diet (Regular diet)  AMBULATORY STATUS COMMUNICATION OF NEEDS Skin   Limited Assist Verbally Surgical wounds                       Personal Care Assistance Level of Assistance  Bathing, Feeding, Dressing Bathing Assistance: Limited assistance Feeding assistance: Independent Dressing Assistance: Limited assistance     Functional Limitations Info  Sight, Hearing, Speech Sight Info: Adequate Hearing Info: Adequate Speech Info: Adequate    SPECIAL CARE FACTORS FREQUENCY  PT (By licensed PT), OT (By licensed OT)     PT Frequency: Minimum 5x a week OT Frequency: Minimum 5x a week            Contractures Contractures Info: Not present    Additional Factors Info  Allergies, Code Status, Psychotropic Code Status Info: Full  Code Allergies Info: Talwin (Pentazocine)   Other   Codeine   Erythromycin   Morphine And Related   Percocet (Oxycodone-acetaminophen) Psychotropic Info: buPROPion (WELLBUTRIN XL) 24 hr tablet 150 mg, citalopram (CELEXA) tablet 40 mg, clonazePAM (KLONOPIN) tablet 1 mg,         Current Medications (10/24/2021):  This is the current hospital active medication list Current Facility-Administered Medications  Medication Dose Route Frequency Provider Last Rate Last Admin   0.9 %  sodium chloride infusion   Intravenous Continuous Netta Cedars, MD   Stopped at 10/22/21 0809   acetaminophen (TYLENOL) tablet 325-650 mg  325-650 mg Oral Q6H PRN Netta Cedars, MD       albuterol (PROVENTIL) (2.5 MG/3ML) 0.083% nebulizer solution 2.5 mg  2.5 mg Nebulization Q6H PRN Netta Cedars, MD       atorvastatin (LIPITOR) tablet 40 mg  40 mg Oral Donavan Burnet, MD   40 mg at 10/23/21 2217   bisacodyl (DULCOLAX) suppository 10 mg  10 mg Rectal Daily PRN Netta Cedars, MD       buPROPion (WELLBUTRIN XL) 24 hr tablet 150 mg  150 mg Oral Donavan Burnet, MD   150 mg at 10/23/21 2217   cholecalciferol (VITAMIN D3) 25 MCG (1000 UNIT) tablet 1,000 Units  1,000 Units Oral Donavan Burnet, MD   1,000 Units at 10/23/21 2217   citalopram (CELEXA) tablet 40 mg  40 mg Oral Donavan Burnet, MD   40 mg  at 10/23/21 2216   clonazePAM (KLONOPIN) tablet 1 mg  1 mg Oral Donavan Burnet, MD   1 mg at 10/23/21 2218   cyanocobalamin (VITAMIN B12) tablet 1,000 mcg  1,000 mcg Oral Donavan Burnet, MD   1,000 mcg at 10/23/21 2217   docusate sodium (COLACE) capsule 100 mg  100 mg Oral BID Netta Cedars, MD   100 mg at 10/24/21 0848   donepezil (ARICEPT) tablet 5 mg  5 mg Oral Donavan Burnet, MD   5 mg at 10/23/21 2217   enoxaparin (LOVENOX) injection 30 mg  30 mg Subcutaneous Daily Nicholes Stairs, MD   30 mg at 10/24/21 0848   furosemide (LASIX) tablet 20 mg  20 mg Oral Daily Netta Cedars, MD   20 mg at 10/24/21 0847    gabapentin (NEURONTIN) capsule 300 mg  300 mg Oral BID Netta Cedars, MD   300 mg at 10/24/21 0847   HYDROcodone-acetaminophen (NORCO/VICODIN) 5-325 MG per tablet 1-2 tablet  1-2 tablet Oral Q4H PRN Netta Cedars, MD   1 tablet at 10/24/21 0557   irbesartan (AVAPRO) tablet 300 mg  300 mg Oral Donavan Burnet, MD   300 mg at 10/23/21 2217   loratadine (CLARITIN) tablet 10 mg  10 mg Oral Donavan Burnet, MD   10 mg at 10/23/21 2217   melatonin tablet 10 mg  10 mg Oral Donavan Burnet, MD   10 mg at 10/23/21 2216   menthol-cetylpyridinium (CEPACOL) lozenge 3 mg  1 lozenge Oral PRN Netta Cedars, MD       Or   phenol (CHLORASEPTIC) mouth spray 1 spray  1 spray Mouth/Throat PRN Netta Cedars, MD       methocarbamol (ROBAXIN) tablet 500 mg  500 mg Oral Q6H PRN Netta Cedars, MD   500 mg at 10/24/21 0557   Or   methocarbamol (ROBAXIN) 500 mg in dextrose 5 % 50 mL IVPB  500 mg Intravenous Q6H PRN Netta Cedars, MD   Stopped at 10/21/21 1141   metoCLOPramide (REGLAN) tablet 5-10 mg  5-10 mg Oral Q8H PRN Netta Cedars, MD       Or   metoCLOPramide (REGLAN) injection 5-10 mg  5-10 mg Intravenous Q8H PRN Netta Cedars, MD       morphine (PF) 2 MG/ML injection 0.5-1 mg  0.5-1 mg Intravenous Q2H PRN Netta Cedars, MD   1 mg at 10/21/21 2315   nitroGLYCERIN (NITROSTAT) SL tablet 0.4 mg  0.4 mg Sublingual Q5 min PRN Netta Cedars, MD       ondansetron [facility]) tablet 4 mg  4 mg Oral Q6H PRN Netta Cedars, MD       Or   ondansetron [facility]) injection 4 mg  4 mg Intravenous Q6H PRN Netta Cedars, MD       Oral care mouth rinse  15 mL Mouth Rinse PRN Netta Cedars, MD       pantoprazole (PROTONIX) EC tablet 40 mg  40 mg Oral Donavan Burnet, MD   40 mg at 10/23/21 2217   pioglitazone (ACTOS) tablet 15 mg  15 mg Oral Q breakfast Netta Cedars, MD   15 mg at 10/24/21 0847   polyethylene glycol (MIRALAX / GLYCOLAX) packet 17 g  17 g Oral Daily PRN Netta Cedars, MD       zinc sulfate capsule 220 mg  220  mg Oral Donavan Burnet, MD   220 mg at 10/23/21 2217     Discharge Medications: Please see  discharge summary for a list of discharge medications.  Relevant Imaging Results:  Relevant Lab Results:   Additional Information SSN [id number]  Ross Ludwig, LCSW

## 2021-10-24 NOTE — TOC PASRR Note (Signed)
[place] Note   Patient Details  Name: Whitney [name] Date of Birth: 11-Jul-1945   Transition of Care [facility]) CM/SW Contact:    Ross Ludwig, LCSW Phone Number: 10/24/2021, 4:47 PM  To Whom It May Concern:  Please be advised that this patient will require a short-term nursing home stay - anticipated 30 days or less for rehabilitation and strengthening.   The plan is for return home.

## 2021-10-24 NOTE — Progress Notes (Signed)
Physical Therapy Treatment Patient Details Name: Whitney [name] MRN: [id number] DOB: 1945/09/03 Today's Date: 10/24/2021   History of Present Illness Pt s/p L TKR and with hx of CKD, DM, neuropathy, PTSD, Bil RCR, CVA    PT Comments    Pt progressing very slowly. She is overall mod assist for transfers and short distance amb, requires excessive time to complete the most basic tasks. Pt son present and cannot assist at home, lives out of town. Pt husband cannot physically assist.   Recommend SNF, doubt pt will make enough progress in a reasonable amount of time to return home at mod I level   Recommendations for follow up therapy are one component of a multi-disciplinary discharge planning process, led by the attending physician.  Recommendations may be updated based on patient status, additional functional criteria and insurance authorization.  Follow Up Recommendations  Follow physician's recommendations for discharge plan and follow up therapies;SNF      Assistance Recommended at Discharge Frequent or constant Supervision/Assistance  Patient can return home with the following A little help with bathing/dressing/bathroom;A lot of help with walking and/or transfers;Assistance with cooking/housework;Assist for transportation;Help with stairs or ramp for entrance   Equipment Recommendations  None recommended by PT    Recommendations for Other Services       Precautions / Restrictions Precautions Precautions: Fall Restrictions Weight Bearing Restrictions: No Other Position/Activity Restrictions: WBAT     Mobility  Bed Mobility Overal bed mobility: Needs Assistance Bed Mobility: Supine to Sit     Supine to sit: Mod assist, HOB elevated Sit to supine: Mod assist   General bed mobility comments: increased time with repetitious multi-modal cues for sequence, and use of UEs to self assist.  Physical assist to manage L LE, to control trunk and to complete rotation with use of  bed pad. excessive time required    Transfers Overall transfer level: Needs assistance Equipment used: Rolling walker (2 wheels) Transfers: Sit to/from Stand Sit to Stand: Mod assist           General transfer comment: cues for LE management and use of UEs to self assist; physical assist to bring wt up and fwd and balance in initial standing.    Ambulation/Gait Ambulation/Gait assistance: Min assist, Mod assist Gait Distance (Feet): 6 Feet Assistive device: Rolling walker (2 wheels) Gait Pattern/deviations: Step-to pattern, Decreased step length - right, Decreased step length - left Gait velocity: decr     General Gait Details: increased time with multi-modal cues for sequence, posture, and position from RW.  Physical assist for balance and RW management.   ltd by fatigue   Stairs             Wheelchair Mobility    Modified Rankin (Stroke Patients Only)       Balance   Sitting-balance support: Feet supported, Bilateral upper extremity supported Sitting balance-Leahy Scale: Fair     Standing balance support: No upper extremity supported, During functional activity, Reliant on assistive device for balance Standing balance-Leahy Scale: Poor                              Cognition Arousal/Alertness: Awake/alert Behavior During Therapy: Flat affect Overall Cognitive Status: Impaired/Different from baseline Area of Impairment: Following commands, Attention, Problem solving                   Current Attention Level: Sustained   Following Commands: Follows one step commands  with increased time, Follows multi-step commands inconsistently     Problem Solving: Slow processing, Decreased initiation, Difficulty sequencing, Requires verbal cues, Requires tactile cues General Comments: Slow processing        Exercises Total Joint Exercises Ankle Circles/Pumps: AROM, Both, 10 reps Quad Sets: AROM, Both, 5 reps, Limitations Quad Sets  Limitations: decr effort, decr cognition Heel Slides: PROM, AAROM, Left, 10 reps Hip ABduction/ADduction: AAROM, Left, 10 reps Straight Leg Raises: AROM, Left, 10 reps    General Comments        Pertinent Vitals/Pain Pain Assessment Pain Assessment: Faces Faces Pain Scale: Hurts even more Pain Location: L knee Pain Descriptors / Indicators: Discomfort, Grimacing, Guarding Pain Intervention(s): Limited activity within patient's tolerance, Monitored during session, Premedicated before session, Repositioned    Home Living                          Prior Function            PT Goals (current goals can now be found in the care plan section) Acute Rehab PT Goals Patient Stated Goal: Regain IND PT Goal Formulation: With patient Time For Goal Achievement: 10/28/21 Potential to Achieve Goals: Good Progress towards PT goals: Progressing toward goals    Frequency    7X/week      PT Plan Current plan remains appropriate    Co-evaluation              AM-PAC PT "6 Clicks" Mobility   Outcome Measure  Help needed turning from your back to your side while in a flat bed without using bedrails?: A Lot Help needed moving from lying on your back to sitting on the side of a flat bed without using bedrails?: A Lot Help needed moving to and from a bed to a chair (including a wheelchair)?: A Lot Help needed standing up from a chair using your arms (e.g., wheelchair or bedside chair)?: A Lot Help needed to walk in hospital room?: Total Help needed climbing 3-5 steps with a railing? : Total 6 Click Score: 10    End of Session Equipment Utilized During Treatment: Gait belt Activity Tolerance: Patient limited by fatigue Patient left: with call bell/phone within reach;in bed;with bed alarm set Nurse Communication: Mobility status PT Visit Diagnosis: Unsteadiness on feet (R26.81);Difficulty in walking, not elsewhere classified (R26.2);Pain Pain - Right/Left: Left Pain -  part of body: Knee     Time: 1530-1557 PT Time Calculation (min) (ACUTE ONLY): 27 min  Charges:  $Gait Training: 8-22 mins $Therapeutic Exercise: 8-22 mins                     Baxter Flattery, PT  Acute Rehab Dept [facility]) [phone]  [facility] Weekend Pager [facility] only)  [phone]  10/24/2021    [facility] 10/24/2021, 4:16 PM

## 2021-10-24 NOTE — Plan of Care (Signed)
  Problem: Activity: Goal: Ability to avoid complications of mobility impairment will improve Outcome: Progressing Goal: Range of joint motion will improve Outcome: Progressing   Problem: Pain Management: Goal: Pain level will decrease with appropriate interventions Outcome: Progressing   

## 2021-10-24 NOTE — Progress Notes (Signed)
   Subjective: 3 Days Post-Op Procedure(s) (LRB): TOTAL KNEE ARTHROPLASTY (Left)  Pt struggling to mobilize and ambulate Pain is moderate Denies any new symptoms or issues Discussing d/c plans Patient reports pain as moderate.  Objective:   VITALS:   Vitals:   10/24/21 0552 10/24/21 1308  BP: (!) 169/63 (!) 168/61  Pulse: 70 73  Resp: 18 16  Temp: 99.6 F (37.6 C) 99.1 F (37.3 C)  SpO2: 95% 96%    Left knee incision healing well Nv intact distally  No rashes or edema distally Guarded rom especially flexion Antalgic gait  LABS Recent Labs    10/22/21 0355 10/23/21 0410  HGB 9.9* 9.6*  HCT 33.0* 31.0*  WBC 8.8 8.1  PLT 144* 148*    Recent Labs    10/22/21 0355  NA 140  K 4.0  BUN 33*  CREATININE 1.76*  GLUCOSE 313*     Assessment/Plan: 3 Days Post-Op Procedure(s) (LRB): TOTAL KNEE ARTHROPLASTY (Left) Continue PT/OT Plan for possible d/c tomorrow but not today Pain management Pulmonary toilet Encourage mobilization    Brad Keirsten Matuska PA-C, MPAS [facility] is now [employer] [facility] [address], [place], [place], [place] [address] Phone: [phone] [email or web address] Facebook  [employer]

## 2021-10-24 NOTE — Progress Notes (Signed)
Physical Therapy Treatment Patient Details Name: Whitney [name] MRN: [id number] DOB: [birth year] Today's Date: 10/24/2021   History of Present Illness Pt s/p L TKR and with hx of CKD, DM, neuropathy, PTSD, Bil RCR, CVA    PT Comments    Pt is more alert today, progressing somewhat slowly. 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  Recommendations may be updated based on patient status, additional functional criteria and insurance authorization.  Follow Up Recommendations  Follow physician's recommendations for discharge plan and follow up therapies     Assistance Recommended at Discharge Frequent or constant Supervision/Assistance  Patient can return home with the following A little help with bathing/dressing/bathroom;A lot of help with walking and/or transfers;Assistance with cooking/housework;Assist for transportation;Help with stairs or ramp for entrance   Equipment Recommendations  None recommended by PT    Recommendations for Other Services       Precautions / Restrictions Precautions Precautions: Fall Restrictions Weight Bearing Restrictions: No Other Position/Activity Restrictions: WBAT     Mobility  Bed Mobility Overal bed mobility: Needs Assistance Bed Mobility: Supine to Sit     Supine to sit: Mod assist, HOB elevated     General bed mobility comments: increased time with repetitious multi-modal cues for sequence, and use of UEs to self assist.  Physical assist to manage L LE, to control trunk and to complete rotation with use of bed pad. excessive time required    Transfers Overall transfer level: Needs assistance Equipment used: Rolling walker (2 wheels) Transfers: Sit to/from Stand Sit to Stand: Mod assist            General transfer comment: cues for LE management and use of UEs to self assist; physical assist to bring wt up and fwd and balance in initial standing.    Ambulation/Gait Ambulation/Gait assistance: Min assist, Mod assist Gait Distance (Feet): 18 Feet Assistive device: Rolling walker (2 wheels) Gait Pattern/deviations: Step-to pattern, Decreased step length - right, Decreased step length - left Gait velocity: decr     General Gait Details: increased time with cues for sequence, posture, and position from RW.  Physical assist for balance and RW management.  Distance ltd by fatigue   Stairs             Wheelchair Mobility    Modified Rankin (Stroke Patients Only)       Balance   Sitting-balance support: Feet supported, Bilateral upper extremity supported Sitting balance-Leahy Scale: Fair     Standing balance support: No upper extremity supported, During functional activity, Reliant on assistive device for balance Standing balance-Leahy Scale: Poor                              Cognition Arousal/Alertness: Awake/alert Behavior During Therapy: Flat affect Overall Cognitive Status: Impaired/Different from baseline Area of Impairment: Following commands, Attention, Problem solving                   Current Attention Level: Sustained   Following Commands: Follows one step commands with increased time     Problem Solving: Slow processing, Decreased initiation, Difficulty sequencing, Requires verbal cues, Requires tactile cues General Comments: Slow processing        Exercises Total Joint Exercises  Ankle Circles/Pumps: AROM, Both, 10 reps    General Comments        Pertinent Vitals/Pain Pain Assessment Pain Assessment: Faces Faces Pain Scale: Hurts little more Pain Location: L knee Pain Descriptors / Indicators: Discomfort, Grimacing, Guarding Pain Intervention(s): Limited activity within patient's tolerance, Monitored during  session, Premedicated before session, Repositioned    Home Living                          Prior Function            PT Goals (current goals can now be found in the care plan section) Acute Rehab PT Goals Patient Stated Goal: Regain IND PT Goal Formulation: With patient Time For Goal Achievement: 10/28/21 Potential to Achieve Goals: Good Progress towards PT goals: Progressing toward goals    Frequency    7X/week      PT Plan Current plan remains appropriate    Co-evaluation              AM-PAC PT "6 Clicks" Mobility   Outcome Measure  Help needed turning from your back to your side while in a flat bed without using bedrails?: A Lot Help needed moving from lying on your back to sitting on the side of a flat bed without using bedrails?: A Lot Help needed moving to and from a bed to a chair (including a wheelchair)?: A Lot Help needed standing up from a chair using your arms (e.g., wheelchair or bedside chair)?: A Lot Help needed to walk in hospital room?: Total Help needed climbing 3-5 steps with a railing? : Total 6 Click Score: 10    End of Session Equipment Utilized During Treatment: Gait belt Activity Tolerance: Patient limited by fatigue Patient left: in chair;with call bell/phone within reach;with chair alarm set Nurse Communication: Mobility status PT Visit Diagnosis: Unsteadiness on feet (R26.81);Difficulty in walking, not elsewhere classified (R26.2);Pain Pain - Right/Left: Left Pain - part of body: Knee     Time: 3154-0086 PT Time Calculation (min) (ACUTE ONLY): 22 min  Charges:  $Gait Training: 8-22 mins                     Baxter Flattery, PT  Acute Rehab Dept [facility]) [phone]  [facility] Weekend Pager [facility] only)  [phone]  10/24/2021    [facility] 10/24/2021, 1:19 PM

## 2021-10-24 NOTE — Evaluation (Signed)
Clinical/Bedside Swallow Evaluation Patient Details  Name: Whitney [name] MRN: [id number] Date of Birth: 1946-01-22  Today's Date: 10/24/2021 Time: SLP Start Time (ACUTE ONLY): 46 SLP Stop Time (ACUTE ONLY): 1240 SLP Time Calculation (min) (ACUTE ONLY): 20 min  Past Medical History:  Past Medical History:  Diagnosis Date   Anxiety    Arthritis    Asthma    Cancer ([place])    skin cancer- face-    Chronic kidney disease    CKD   Complication of anesthesia    Concussion    Depression    Diabetes mellitus without complication (HCC)    GERD (gastroesophageal reflux disease)    Hypertension    IBS (irritable bowel syndrome)    diarrhea   MRSA (methicillin resistant Staphylococcus aureus)    after skin cancer removed from face.   Neuropathy    Pneumonia    PONV (postoperative nausea and vomiting)    years ago   PTSD (post-traumatic stress disorder)    Stroke [facility])    "they think a silent one"   Past Surgical History:  Past Surgical History:  Procedure Laterality Date   ABDOMINAL HYSTERECTOMY     CARPAL TUNNEL RELEASE Bilateral    CATARACT EXTRACTION Bilateral    CHOLECYSTECTOMY     DILATION AND CURETTAGE OF UTERUS     LOWER EXTREMITY VENOGRAPHY N/A 01/10/2017   Procedure: LOWER EXTREMITY ASCENDING VENOGRAPHY;  Surgeon: Waynetta Sandy, MD;  Location: [place] [facility];  Service: Cardiovascular;  Laterality: N/A;   OVARIAN CYST SURGERY     PERIPHERAL VASCULAR BALLOON ANGIOPLASTY Right 01/10/2017   Procedure: PERIPHERAL VASCULAR BALLOON ANGIOPLASTY;  Surgeon: Waynetta Sandy, MD;  Location: [place] [facility];  Service: Cardiovascular;  Laterality: Right;   ROTATOR CUFF REPAIR Bilateral    SALPINGOOPHORECTOMY Bilateral    SHOULDER ARTHROSCOPY WITH SUBACROMIAL DECOMPRESSION AND OPEN ROTATOR C Right 01/31/2013   Procedure: RIGHT SHOULDER ARTHROSCOPY WITH SUBACROMIAL DECOMPRESSION AND MINI OPEN ROTATOR CUFF REPAIR, OPEN BICEP TENDODESIS AND OPEN DISTAL  CLAVICLE RESECTION;  Surgeon: Augustin Schooling, MD;  Location: [place];  Service: Orthopedics;  Laterality: Right;   SHOULDER ARTHROSCOPY WITH SUBACROMIAL DECOMPRESSION AND OPEN ROTATOR C Left 11/28/2013   Procedure: LEFT SHOULDER ARTHROSCOPY WITH SUBACROMIAL DECOMPRESSION  BICEP TENODESIS MINI OPEN ROTATOR CUFF REPAIR, OPEN DISTAL CLAVICLE RESECTION ;  Surgeon: Augustin Schooling, MD;  Location: [place];  Service: Orthopedics;  Laterality: Left;   TRIGGER FINGER RELEASE Right    4    TRIGGER FINGER RELEASE Bilateral    thumbs   TUBAL LIGATION     HPI:  Patient is a 76 y.o. female with PMH: anxiety, GERD, HTN, IBS, PTSD, stroke. She presented to the hospital on 10/21/21 for left TKR. She reported to her RN on 10/1 that she has trouble swallowing foods and liquids at home and RN did notice patient having some difficulty swallowing pills. SLP ordered for swallow evaluation.    Assessment / Plan / Recommendation  Clinical Impression  Patient is not currently presenting with clinical s/s of oral or pharyngeal phase dysphagia as per this bedside/clinical swallow evaluation. Per her report of globus sensation, regurgitation of food h/o GERD and presentation of hoarse vocal quality, SLP suspects patient's voice and swallowing difficulties are esophageal in nature. Patient reported that her hoarse voice has been going on for several months. SLP did see an encounter from 2021 to GI with note indicating that an EGD with dilation was done but unfortunately this was performed  at a different hospital (patient reports [facility]) and SLP not able to see the full notes. Patient's dysphagia complaints are not acute and not related to her current hospitalization and so SLP recommending she f/u with her PCP after discharging hospital. She may benefit from outpatient GI and/or outpatient ENT. SLP Visit Diagnosis: Dysphagia, unspecified (R13.10)    Aspiration Risk  No limitations    Diet Recommendation Regular;Thin  liquid   Liquid Administration via: Cup;Straw Medication Administration: Whole meds with liquid Supervision: Patient able to self feed Compensations: Small sips/bites;Slow rate Postural Changes: Seated upright at 90 degrees;Remain upright for at least 30 minutes after po intake    Other  Recommendations Oral Care Recommendations: Oral care BID;Patient independent with oral care    Recommendations for follow up therapy are one component of a multi-disciplinary discharge planning process, led by the attending physician.  Recommendations may be updated based on patient status, additional functional criteria and insurance authorization.  Follow up Recommendations No SLP follow up      Assistance Recommended at Discharge None  Functional Status Assessment Patient has not had a recent decline in their functional status  Frequency and Duration   N/A         Prognosis Prognosis for Safe Diet Advancement: Good      Swallow Study   General Date of Onset: 10/23/21 HPI: Patient is a 76 y.o. female with PMH: anxiety, GERD, HTN, IBS, PTSD, stroke. She presented to the hospital on 10/21/21 for left TKR. She reported to her RN on 10/1 that she has trouble swallowing foods and liquids at home and RN did notice patient having some difficulty swallowing pills. SLP ordered for swallow evaluation. Type of Study: Bedside Swallow Evaluation Previous Swallow Assessment: none found Diet Prior to this Study: Regular;Thin liquids Temperature Spikes Noted: No Respiratory Status: Room air History of Recent Intubation: No Behavior/Cognition: Alert;Cooperative;Pleasant mood Oral Cavity Assessment: Within Functional Limits Oral Care Completed by SLP: No Oral Cavity - Dentition: Adequate natural dentition Vision: Functional for self-feeding Self-Feeding Abilities: Able to feed self Patient Positioning: Upright in chair Baseline Vocal Quality: Hoarse Volitional Cough: Strong Volitional Swallow: Able to  elicit    Oral/Motor/Sensory Function Overall Oral Motor/Sensory Function: Within functional limits   Ice Chips     Thin Liquid Thin Liquid: Within functional limits Presentation: Self Fed;Cup    Nectar Thick Nectar Thick Liquid: Not tested   Honey Thick Honey Thick Liquid: Not tested   Puree Puree: Not tested   Solid     Solid: Not tested     Sonia Baller, MA, CCC-SLP Speech Therapy

## 2021-10-24 NOTE — TOC Progression Note (Addendum)
Transition of Care [facility]) - Progression Note    Patient Details  Name: Whitney [name] MRN: [id number] Date of Birth: 12-Nov-1945  Transition of Care [facility]) CM/SW Contact  Ross Ludwig, [place] Phone Number: 10/24/2021, 4:59 PM  Clinical Narrative:     CSW spoke to patient to discuss SNF recommendation from PT for her.  CSW explained to her that because it was a planned procedure we have to get approval from the ortho team to have her go to SNF and also get insurance to approve her for rehab.    CSW sent a secure text message to Sharee Pimple at [facility] case manager to see if they will approve SNF for rehab.  CSW explained to patient what to expect and the process for working on getting SNF placement arranged.  Patient expressed understanding, and gave CSW permission to begin bed search in [facility].  Patient stated she would like [facility] if possible, CSW informed her they are on the list of SNFs.  Patient is a level 2 [place] screen, a [place] number will have to be produced before she can go to SNF.      Barriers to Discharge: Barriers Level 2 [place], and insurance auth.  Expected Discharge Plan and Services  SNF verse [facility] with [facility].                         DME Arranged: N/A DME Agency: NA                   Social Determinants of Health (SDOH) Interventions    Readmission Risk Interventions     No data to display

## 2021-10-25 NOTE — Progress Notes (Signed)
Physical Therapy Treatment Patient Details Name: Whitney [name] MRN: [id number] DOB: [birth year] Today's Date: 10/25/2021   History of Present Illness Pt s/p L TKR and with hx of CKD, DM, neuropathy, PTSD, Bil RCR, CVA    PT Comments    Pt is progressing, however slow progress overall--POD #4. Pt in bed on purewick on PT arrival (she is not getting up to go to bathroom). Min assist for bed mobility, transfers and continues to require an excessive amount of time to complete functional tasks. Gait velocity indicative of recurrent falls. Pt has no assist home. Recommend SNF to incr mobility and safety for incr independence and safe return home   Recommendations for follow up therapy are one component of a multi-disciplinary discharge planning process, led by the attending physician.  Recommendations may be updated based on patient status, additional functional criteria and insurance authorization.  Follow Up Recommendations  Skilled nursing-short term rehab (<3 hours/day) Can patient physically be transported by private vehicle: No   Assistance Recommended at Discharge Frequent or constant Supervision/Assistance  Patient can return home with the following A little help with bathing/dressing/bathroom;A lot of help with walking and/or transfers;Assistance with cooking/housework;Assist for transportation;Help with stairs or ramp for entrance   Equipment Recommendations  None recommended by PT    Recommendations for Other Services       Precautions / Restrictions Precautions Precautions: Fall;Knee Precaution Booklet Issued: No Restrictions Weight Bearing Restrictions: No Other Position/Activity Restrictions: WBAT     Mobility  Bed Mobility Overal bed mobility: Needs Assistance Bed Mobility: Supine to Sit     Supine to sit: Min assist     General bed mobility comments: incr time and effort, min assist to progress LLE off bed, pt able to complete with use of leg lifter. bed  flat, heavy reliance on bed rail    Transfers Overall transfer level: Needs assistance Equipment used: Rolling walker (2 wheels) Transfers: Sit to/from Stand Sit to Stand: Min assist           General transfer comment: cues for LE management and use of UEs to self assist; light assist to bring wt up and fwd and balance in initial standing.    Ambulation/Gait Ambulation/Gait assistance: Min assist, Min guard Gait Distance (Feet): 55 Feet Assistive device: Rolling walker (2 wheels) Gait Pattern/deviations: Step-to pattern, Decreased step length - right, Decreased step length - left, Wide base of support Gait velocity: .10 Gait velocity interpretation: <1.8 ft/sec, indicate of risk for recurrent falls   General Gait Details: increased time with verbal cues for posture, initial sequence  and position from RW.  intermittent assist to guide and steer RW, pt drifting into rail/wall.   Stairs             Wheelchair Mobility    Modified Rankin (Stroke Patients Only)       Balance   Sitting-balance support: Feet supported, Bilateral upper extremity supported, No upper extremity supported Sitting balance-Leahy Scale: Fair Sitting balance - Comments: difficulty wt shifting/scooting d/t body habitus and core weakness   Standing balance support: No upper extremity supported, During functional activity, Reliant on assistive device for balance Standing balance-Leahy Scale: Fair Standing balance comment: briefly able to maintain static stand without UE support; reliant on device for dynaic activities                            Cognition Arousal/Alertness: Awake/alert Behavior During Therapy: [facility] for tasks assessed/performed  Current Attention Level: Sustained   Following Commands: Follows one step commands with increased time, Follows multi-step commands inconsistently     Problem Solving: Slow processing, Decreased initiation,  Difficulty sequencing, Requires verbal cues, Requires tactile cues          Exercises Total Joint Exercises Ankle Circles/Pumps: AROM, Both, 10 reps Quad Sets: AROM, Both, 5 reps    General Comments        Pertinent Vitals/Pain Pain Assessment Pain Assessment: 0-10 Pain Score: 4  Pain Location: L knee Pain Descriptors / Indicators: Discomfort, Grimacing, Guarding Pain Intervention(s): Limited activity within patient's tolerance, Monitored during session, Repositioned, Ice applied    Home Living                          Prior Function            PT Goals (current goals can now be found in the care plan section) Acute Rehab PT Goals Patient Stated Goal: Regain IND PT Goal Formulation: With patient Time For Goal Achievement: 10/28/21 Potential to Achieve Goals: Good Progress towards PT goals: Progressing toward goals    Frequency    7X/week      PT Plan Current plan remains appropriate    Co-evaluation              AM-PAC PT "6 Clicks" Mobility   Outcome Measure  Help needed turning from your back to your side while in a flat bed without using bedrails?: A Lot Help needed moving from lying on your back to sitting on the side of a flat bed without using bedrails?: A Little Help needed moving to and from a bed to a chair (including a wheelchair)?: A Little Help needed standing up from a chair using your arms (e.g., wheelchair or bedside chair)?: A Little Help needed to walk in hospital room?: A Lot Help needed climbing 3-5 steps with a railing? : A Lot 6 Click Score: 15    End of Session Equipment Utilized During Treatment: Gait belt Activity Tolerance: Patient tolerated treatment well Patient left: with call bell/phone within reach;in chair;with chair alarm set   PT Visit Diagnosis: Unsteadiness on feet (R26.81);Difficulty in walking, not elsewhere classified (R26.2);Pain Pain - Right/Left: Left Pain - part of body: Knee     Time:  3474-2595 PT Time Calculation (min) (ACUTE ONLY): 28 min  Charges:  $Gait Training: 23-37 mins                     Danaya Geddis, PT  Acute Rehab Dept [facility]) [phone]  [facility] Weekend Pager [facility] only)  [phone]  10/25/2021    [facility] 10/25/2021, 10:52 AM

## 2021-10-25 NOTE — Progress Notes (Signed)
Orthopedics Progress Note  Subjective: Patient reports minimal pain this AM. Therapy going better  Objective:  Vitals:   10/24/21 2211 10/25/21 0541  BP: (!) 164/61 (!) 161/59  Pulse: 67 65  Resp:  16  Temp: 98.6 F (37 C) 98.2 F (36.8 C)  SpO2:  98%    General: Awake and alert  Musculoskeletal: Left knee incision benign. Dressing changed. Neg Homans Neurovascularly intact  Lab Results  Component Value Date   WBC 8.1 10/23/2021   HGB 9.6 (L) 10/23/2021   HCT 31.0 (L) 10/23/2021   MCV 96.6 10/23/2021   PLT 148 (L) 10/23/2021       Component Value Date/Time   NA 140 10/22/2021 0355   K 4.0 10/22/2021 0355   CL 109 10/22/2021 0355   CO2 21 (L) 10/22/2021 0355   GLUCOSE 313 (H) 10/22/2021 0355   BUN 33 (H) 10/22/2021 0355   CREATININE 1.76 (H) 10/22/2021 0355   CALCIUM 8.6 (L) 10/22/2021 0355   GFRNONAA 29 (L) 10/22/2021 0355   GFRAA 74 (L) 11/29/2013 0511    No results found for: "INR", "PROTIME"  Assessment/Plan: POD #4 s/p Procedure(s): TOTAL KNEE ARTHROPLASTY Stable this AM. Continue therapy and discharge planning.  Possible short term SNF. FL-2 signed. Leonides Grills will assist with recommendations after therapy assessment today  Doran Heater. Veverly Fells, MD 10/25/2021 7:48 AM

## 2021-10-25 NOTE — Progress Notes (Signed)
Physical Therapy Treatment Patient Details Name: Whitney [name] MRN: [id number] DOB: May 11, 1945 Today's Date: 10/25/2021   History of Present Illness Pt s/p L TKR and with hx of CKD, DM, neuropathy, PTSD, Bil RCR, CVA    PT Comments    Pt progressing slowly. Worked with PT on TKA  exercises with improvement in ROM today with incr time allowed.. Pt verbalizing much difficulty with doing daily activities since her son's as well as her dtr's deaths over the last few years. Continue PT  acute setting   Recommendations for follow up therapy are one component of a multi-disciplinary discharge planning process, led by the attending physician.  Recommendations may be updated based on patient status, additional functional criteria and insurance authorization.  Follow Up Recommendations  Follow physician's recommendations for discharge plan and follow up therapies     Assistance Recommended at Discharge Frequent or constant Supervision/Assistance  Patient can return home with the following A little help with bathing/dressing/bathroom;A lot of help with walking and/or transfers;Assistance with cooking/housework;Assist for transportation;Help with stairs or ramp for entrance   Equipment Recommendations  None recommended by PT    Recommendations for Other Services       Precautions / Restrictions Precautions Precautions: Fall Restrictions Weight Bearing Restrictions: No Other Position/Activity Restrictions: WBAT     Mobility  Bed Mobility Overal bed mobility: Needs Assistance Bed Mobility: Supine to Sit     Supine to sit: Min assist Sit to supine: Mod assist   General bed mobility comments: increased time with repetitious multi-modal cues for sequence, and use of UEs to self assist.  Physical assist to manage L LE, to control trunk and to complete rotation with use of bed pad. excessive time required    Transfers Overall transfer level: Needs assistance Equipment used: Rolling  walker (2 wheels) Transfers: Sit to/from Stand Sit to Stand: Mod assist           General transfer comment: cues for LE management and use of UEs to self assist; physical assist to bring wt up and fwd and balance in initial standing.    Ambulation/Gait Ambulation/Gait assistance: Min assist, Min guard Gait Distance (Feet): 55 Feet Assistive device: Rolling walker (2 wheels) Gait Pattern/deviations: Step-to pattern, Decreased step length - right, Decreased step length - left, Wide base of support Gait velocity: .10 Gait velocity interpretation: <1.8 ft/sec, indicate of risk for recurrent falls   General Gait Details:  (just getting back to bed with nursing staff on PT arrival)   Stairs             Wheelchair Mobility    Modified Rankin (Stroke Patients Only)       Balance   Sitting-balance support: Feet supported, Bilateral upper extremity supported Sitting balance-Leahy Scale: Fair Sitting balance - Comments: difficulty wt shifting/scooting d/t body habitus and core weakness   Standing balance support: No upper extremity supported, During functional activity, Reliant on assistive device for balance Standing balance-Leahy Scale: Poor Standing balance comment: briefly able to maintain static stand without UE support; reliant on device for dynaic activities                            Cognition Arousal/Alertness: Awake/alert Behavior During Therapy: Flat affect Overall Cognitive Status: Impaired/Different from baseline Area of Impairment: Following commands, Attention, Problem solving                   Current Attention Level: Sustained  Following Commands: Follows one step commands with increased time, Follows multi-step commands inconsistently     Problem Solving: Slow processing, Decreased initiation, Difficulty sequencing, Requires verbal cues, Requires tactile cues          Exercises Total Joint Exercises Ankle Circles/Pumps:  AROM, Both, 10 reps Quad Sets: AROM, Both, 5 reps, Limitations Short Arc Quad: AAROM, Left, 10 reps Heel Slides: AAROM, Left, 10 reps, Both, AROM Hip ABduction/ADduction: AAROM, 10 reps, Both, AROM Straight Leg Raises: Left, 10 reps, AAROM Knee Flexion: Other (comment) (passive prolonged  knee flexion stretch L knee) Goniometric ROM: grossly 10 to 45 degrees    General Comments        Pertinent Vitals/Pain Pain Assessment Pain Assessment: 0-10 Pain Score: 4  Faces Pain Scale: Hurts even more Pain Location: L knee Pain Descriptors / Indicators: Discomfort, Grimacing, Guarding Pain Intervention(s): Limited activity within patient's tolerance, Monitored during session, Premedicated before session, Repositioned    Home Living Family/patient expects to be discharged to:: Private residence Living Arrangements: Spouse/significant other Available Help at Discharge: Family;Available PRN/intermittently Type of Home: House Home Access: Ramped entrance       Home Layout: One level Home Equipment: Shower seat;Grab bars - toilet;Rolling Environmental consultant (2 wheels)      Prior Function            PT Goals (current goals can now be found in the care plan section) Acute Rehab PT Goals Patient Stated Goal: Regain IND PT Goal Formulation: With patient Time For Goal Achievement: 10/28/21 Potential to Achieve Goals: Good Progress towards PT goals: Progressing toward goals    Frequency    7X/week      PT Plan Current plan remains appropriate    Co-evaluation              AM-PAC PT "6 Clicks" Mobility   Outcome Measure  Help needed turning from your back to your side while in a flat bed without using bedrails?: A Lot Help needed moving from lying on your back to sitting on the side of a flat bed without using bedrails?: A Lot Help needed moving to and from a bed to a chair (including a wheelchair)?: A Lot Help needed standing up from a chair using your arms (e.g., wheelchair or  bedside chair)?: A Lot Help needed to walk in hospital room?: Total Help needed climbing 3-5 steps with a railing? : Total 6 Click Score: 10    End of Session Equipment Utilized During Treatment: Gait belt Activity Tolerance: Patient limited by fatigue Patient left: with call bell/phone within reach;in bed;with bed alarm set Nurse Communication: Mobility status PT Visit Diagnosis: Unsteadiness on feet (R26.81);Difficulty in walking, not elsewhere classified (R26.2);Pain Pain - Right/Left: Left Pain - part of body: Knee     Time: 1610-9604 PT Time Calculation (min) (ACUTE ONLY): 25 min  Charges:  $Therapeutic Exercise: 23-37 mins                     Baxter Flattery, PT  Acute Rehab Dept [facility]) [phone]  [facility] Weekend Pager [facility] only)  [phone]  10/25/2021    [facility] 10/25/2021, 3:51 PM

## 2021-10-25 NOTE — Plan of Care (Signed)
  Problem: Education: Goal: Knowledge of the prescribed therapeutic regimen will improve Outcome: Progressing   Problem: Activity: Goal: Ability to avoid complications of mobility impairment will improve Outcome: Progressing Goal: Range of joint motion will improve Outcome: Progressing   Problem: Clinical Measurements: Goal: Postoperative complications will be avoided or minimized Outcome: Progressing   Problem: Pain Management: Goal: Pain level will decrease with appropriate interventions Outcome: Progressing   Problem: Skin Integrity: Goal: Will show signs of wound healing Outcome: Progressing   Problem: Health Behavior/Discharge Planning: Goal: Ability to manage health-related needs will improve Outcome: Progressing   Problem: Clinical Measurements: Goal: Ability to maintain clinical measurements within normal limits will improve Outcome: Progressing Goal: Will remain free from infection Outcome: Progressing Goal: Diagnostic test results will improve Outcome: Progressing Goal: Respiratory complications will improve Outcome: Progressing Goal: Cardiovascular complication will be avoided Outcome: Progressing   Problem: Activity: Goal: Risk for activity intolerance will decrease Outcome: Progressing   Problem: Nutrition: Goal: Adequate nutrition will be maintained Outcome: Progressing   Problem: Coping: Goal: Level of anxiety will decrease Outcome: Progressing   Problem: Elimination: Goal: Will not experience complications related to bowel motility Outcome: Progressing Goal: Will not experience complications related to urinary retention Outcome: Progressing   Problem: Pain Managment: Goal: General experience of comfort will improve Outcome: Progressing   Problem: Safety: Goal: Ability to remain free from injury will improve Outcome: Progressing   Problem: Skin Integrity: Goal: Risk for impaired skin integrity will decrease Outcome: Progressing   

## 2021-10-25 NOTE — Evaluation (Signed)
Occupational Therapy Evaluation Patient Details Name: Whitney [name] MRN: [id number] DOB: Aug 30, 1945 Today's Date: 10/25/2021   History of Present Illness Pt s/p L TKR and with hx of CKD, DM, neuropathy, PTSD, Bil RCR, CVA   Clinical Impression   Pt is s/p TKA resulting in the deficits listed below (see OT Problem List).  Pt will benefit from skilled OT to increase their safety and independence with ADL and functional mobility for ADL to facilitate discharge to venue listed below.         Recommendations for follow up therapy are one component of a multi-disciplinary discharge planning process, led by the attending physician.  Recommendations may be updated based on patient status, additional functional criteria and insurance authorization.   Follow Up Recommendations  Skilled nursing-short term rehab (<3 hours/day)    Assistance Recommended at Discharge None  Patient can return home with the following A lot of help with walking and/or transfers;A little help with walking and/or transfers    Functional Status Assessment  Patient has had a recent decline in their functional status and demonstrates the ability to make significant improvements in function in a reasonable and predictable amount of time.  Equipment Recommendations  BSC/3in1    Recommendations for Other Services       Precautions / Restrictions Precautions Precautions: Fall Restrictions Weight Bearing Restrictions: No Other Position/Activity Restrictions: WBAT      Mobility Bed Mobility               General bed mobility comments: pt in chair    Transfers Overall transfer level: Needs assistance Equipment used: Rolling walker (2 wheels) Transfers: Sit to/from Stand, Bed to chair/wheelchair/BSC Sit to Stand: Mod assist Stand pivot transfers: Mod assist         General transfer comment: increased time      Balance   Sitting-balance support: Feet supported, Bilateral upper extremity  supported Sitting balance-Leahy Scale: Fair     Standing balance support: No upper extremity supported, During functional activity, Reliant on assistive device for balance Standing balance-Leahy Scale: Poor Standing balance comment: briefly able to maintain static stand without UE support; reliant on device for dynaic activities                           ADL either performed or assessed with clinical judgement   ADL Overall ADL's : Needs assistance/impaired     Grooming: Sitting;Set up   Upper Body Bathing: Set up;Sitting   Lower Body Bathing: Maximal assistance;Sit to/from stand;Cueing for safety;Cueing for sequencing   Upper Body Dressing : Minimal assistance;Sitting   Lower Body Dressing: Maximal assistance;Sit to/from stand;Cueing for safety;Cueing for sequencing;Cueing for compensatory techniques   Toilet Transfer: Moderate assistance;Stand-pivot;Cueing for safety;Cueing for sequencing   Toileting- Clothing Manipulation and Hygiene: Maximal assistance;Sit to/from stand;Cueing for sequencing;With caregiver independent assisting;Cueing for safety         General ADL Comments: pt will need ST SNF for rehab.  Pt not safe to return home at this time.  Husband unable to A pt     Vision Patient Visual Report: No change from baseline              Pertinent Vitals/Pain Pain Assessment Pain Score: 3  Pain Location: L knee Pain Descriptors / Indicators: Discomfort, Grimacing, Guarding     Hand Dominance Right   Extremity/Trunk Assessment Upper Extremity Assessment Upper Extremity Assessment: Generalized weakness       Cervical / Trunk  Assessment Cervical / Trunk Assessment: Normal   Communication Communication Communication: No difficulties   Cognition Arousal/Alertness: Awake/alert Behavior During Therapy: WFL for tasks assessed/performed                       Current Attention Level: Sustained   Following Commands: Follows multi-step  commands inconsistently     Problem Solving: Slow processing, Decreased initiation, Difficulty sequencing, Requires verbal cues, Requires tactile cues                  Home Living Family/patient expects to be discharged to:: Private residence Living Arrangements: Spouse/significant other Available Help at Discharge: Family;Available PRN/intermittently Type of Home: House Home Access: Ramped entrance     Home Layout: One level     Bathroom Shower/Tub: Occupational psychologist: Handicapped height     Home Equipment: Shower seat;Grab bars - toilet;Rolling Environmental consultant (2 wheels)          Prior Functioning/Environment Prior Level of Function : Independent/Modified Independent                        OT Problem List: Decreased strength;Impaired balance (sitting and/or standing);Decreased activity tolerance;Decreased knowledge of use of DME or AE;Decreased safety awareness;Decreased knowledge of precautions      OT Treatment/Interventions: Self-care/ADL training;Patient/family education;DME and/or AE instruction;Therapeutic activities    OT Goals(Current goals can be found in the care plan section) Acute Rehab OT Goals Patient Stated Goal: get well OT Goal Formulation: With patient Time For Goal Achievement: 11/08/21 ADL Goals Pt Will Perform Lower Body Bathing: with supervision;sit to/from stand Pt Will Perform Lower Body Dressing: with min assist;sit to/from stand Pt Will Transfer to Toilet: with supervision;bedside commode Pt Will Perform Toileting - Clothing Manipulation and hygiene: with supervision;sit to/from stand  OT Frequency: Min 2X/week       AM-PAC OT "6 Clicks" Daily Activity     Outcome Measure Help from another person eating meals?: None Help from another person taking care of personal grooming?: A Little Help from another person toileting, which includes using toliet, bedpan, or urinal?: A Lot Help from another person bathing (including  washing, rinsing, drying)?: A Lot Help from another person to put on and taking off regular upper body clothing?: A Little Help from another person to put on and taking off regular lower body clothing?: A Lot 6 Click Score: 16   End of Session Equipment Utilized During Treatment: Gait belt;Rolling walker (2 wheels) Nurse Communication: Mobility status  Activity Tolerance: Patient limited by fatigue Patient left: in chair  OT Visit Diagnosis: Unsteadiness on feet (R26.81);Muscle weakness (generalized) (M62.81);Other abnormalities of gait and mobility (R26.89);History of falling (Z91.81)                Time: 1340-1403 OT Time Calculation (min): 23 min Charges:  OT General Charges $OT Visit: 1 Visit OT Evaluation $OT Eval Low Complexity: [place], [place]  [phone]     Payton Mccallum D 10/25/2021, 4:01 PM

## 2021-10-25 NOTE — TOC Progression Note (Signed)
Transition of Care [facility]) - Progression Note    Patient Details  Name: Whitney [name] MRN: [id number] Date of Birth: 1946-01-18  [facility] of [facility]) CM/SW Contact  Ross Ludwig, [place] Phone Number: 10/25/2021, 11:40 AM  Clinical Narrative:     Signed FL2 uploaded to [place].  CSW to continue to follow patient's progress throughout discharge planning.     Barriers to Discharge: Barriers Resolved  Expected Discharge Plan and Services                           DME Arranged: N/A DME Agency: NA                   Social Determinants of Health (SDOH) Interventions    Readmission Risk Interventions     No data to display

## 2021-10-25 NOTE — Plan of Care (Signed)
  Problem: Education: Goal: Knowledge of the prescribed therapeutic regimen will improve Outcome: Progressing   Problem: Activity: Goal: Range of joint motion will improve Outcome: Progressing   Problem: Pain Management: Goal: Pain level will decrease with appropriate interventions Outcome: Progressing

## 2021-10-26 NOTE — Progress Notes (Addendum)
   Subjective: 5 Days Post-Op Procedure(s) (LRB): TOTAL KNEE ARTHROPLASTY (Left)  Pt still struggling with ADLs and mobilization Therapy recommending SNF placement Pain remains moderate in the knee Denies any other symptoms Patient reports pain as moderate.  Objective:   VITALS:   Vitals:   10/25/21 0541 10/25/21 1449  BP: (!) 161/59 (!) 155/57  Pulse: 65 72  Resp: 16 16  Temp: 98.2 F (36.8 C) 98.4 F (36.9 C)  SpO2: 98% 97%    Left knee incision healing well Nv intact distally Dressing intact No rashes or edema distally  LABS No results for input(s): "HGB", "HCT", "WBC", "PLT" in the last 72 hours.  No results for input(s): "NA", "K", "BUN", "CREATININE", "GLUCOSE" in the last 72 hours.   Assessment/Plan: 5 Days Post-Op Procedure(s) (LRB): TOTAL KNEE ARTHROPLASTY (Left) Plan for SNF placement today Continue PT/OT Pain management Continue to encourage ambulation and mobilization F/u in the office in 2 weeks  Patient doing better with therapy.  Should only need a couple of days at [facility] then home with family ASAP  Netta Cedars MD     Merla Riches PA-C, [place] is now [facility]  [place] [address] [place], [place], [place] [address] Phone: [phone] [email or web address]  [employer]

## 2021-10-26 NOTE — Discharge Summary (Signed)
In most cases prophylactic antibiotics for Dental procdeures after total joint surgery are not necessary.  Exceptions are as follows:  1. History of prior total joint infection  2. Severely immunocompromised (Organ Transplant, cancer chemotherapy, Rheumatoid biologic meds such as [place])  3. Poorly controlled diabetes (A1C &gt; 8.0, blood glucose over 200)  If you have one of these conditions, contact your surgeon for an antibiotic prescription, prior to your dental procedure. Orthopedic Discharge Summary        Physician Discharge Summary  Patient ID: Whitney [name] MRN: [id number] DOB/AGE: 05-02-1945 76 y.o.  Admit date: 10/21/2021 Discharge date: 10/26/2021   Procedures:  Procedure(s) (LRB): TOTAL KNEE ARTHROPLASTY (Left)  Attending Physician:  Dr. Esmond Plants  Admission Diagnoses:   left knee end stage osteoarthritis  Discharge Diagnoses:  left knee end stage osteoarthritis   Past Medical History:  Diagnosis Date   Anxiety    Arthritis    Asthma    Cancer ([place])    skin cancer- face-    Chronic kidney disease    CKD   Complication of anesthesia    Concussion    Depression    Diabetes mellitus without complication (HCC)    GERD (gastroesophageal reflux disease)    Hypertension    IBS (irritable bowel syndrome)    diarrhea   MRSA (methicillin resistant Staphylococcus aureus)    after skin cancer removed from face.   Neuropathy    Pneumonia    PONV (postoperative nausea and vomiting)    years ago   PTSD (post-traumatic stress disorder)    Stroke [facility])    "they think a silent one"    PCP: Ronita Hipps, MD   Discharged Condition: fair  Hospital Course:  Patient underwent the above stated procedure on 10/21/2021. Patient tolerated the procedure well and brought to the recovery room in good condition and subsequently to the floor. Patient had an uncomplicated hospital course and was stable for discharge. Therapy progressed slow and patient  having issues with ADLs. Therapy recommendation for SNF placement   Disposition: Discharge disposition: 03-Skilled Nursing Facility      with follow up in 2 weeks    Follow-up Information     Netta Cedars, MD. Go on 11/03/2021.   Specialty: Orthopedic Surgery Why: You are scheduled for first post op appointment on Thursday October 12th at 9:30am. Contact information: [address] [place] [address] [phone]         [facility], [place]. Go on 10/24/2021.   Why: You are scheduled for physical therapy evaluation on Monday October 2nd at [facility] information: [place] [id number] [phone]         [facility], [place] Follow up.   Specialty: Home Health Services Why: [facility] will call you to set up the first visit for home health. Contact information: [address] [place] [address] [phone]                 Dental Antibiotics:  In most cases prophylactic antibiotics for Dental procdeures after total joint surgery are not necessary.  Exceptions are as follows:  1. History of prior total joint infection  2. Severely immunocompromised (Organ Transplant, cancer chemotherapy, Rheumatoid biologic meds such as [place])  3. Poorly controlled diabetes (A1C &gt; 8.0, blood glucose over 200)  If you have one of these conditions, contact your surgeon for an antibiotic prescription, prior to your dental procedure.  Discharge Instructions  Call MD / Call 911   Complete by: As directed    If you experience chest pain or shortness of breath, CALL 911 and be transported to the hospital emergency room.  If you develope a fever above 101 F, pus (white drainage) or increased drainage or redness at the wound, or calf pain, call your surgeon's office.   Constipation Prevention   Complete by: As directed    Drink plenty of fluids.  Prune juice may be helpful.  You may use a stool softener, such as Colace  (over the counter) 100 mg twice a day.  Use MiraLax (over the counter) for constipation as needed.   Diet - low sodium heart healthy   Complete by: As directed    Increase activity slowly as tolerated   Complete by: As directed    Post-operative opioid taper instructions:   Complete by: As directed    POST-OPERATIVE OPIOID TAPER INSTRUCTIONS: It is important to wean off of your opioid medication as soon as possible. If you do not need pain medication after your surgery it is ok to stop day one. Opioids include: Codeine, Hydrocodone(Norco, Vicodin), Oxycodone(Percocet, oxycontin) and hydromorphone amongst others.  Long term and even short term use of opiods can cause: Increased pain response Dependence Constipation Depression Respiratory depression And more.  Withdrawal symptoms can include Flu like symptoms Nausea, vomiting And more Techniques to manage these symptoms Hydrate well Eat regular healthy meals Stay active Use relaxation techniques(deep breathing, meditating, yoga) Do Not substitute Alcohol to help with tapering If you have been on opioids for less than two weeks and do not have pain than it is ok to stop all together.  Plan to wean off of opioids This plan should start within one week post op of your joint replacement. Maintain the same interval or time between taking each dose and first decrease the dose.  Cut the total daily intake of opioids by one tablet each day Next start to increase the time between doses. The last dose that should be eliminated is the evening dose.          Allergies as of 10/26/2021       Reactions   Talwin [pentazocine] Nausea Only, Other (See Comments)   Confusion   Other    Blood Product Refusal    Codeine Nausea And Vomiting, Other (See Comments)   disorientation   Erythromycin Hives   Morphine And Related Rash   Blisters, pt states she can take this med in small doses    Percocet [oxycodone-acetaminophen] Itching         Medication List     TAKE these medications    acetaminophen 650 MG CR tablet Commonly known as: TYLENOL Take 650 mg by mouth every 8 (eight) hours as needed for pain.   albuterol 108 (90 Base) MCG/ACT inhaler Commonly known as: VENTOLIN HFA Inhale 2 puffs into the lungs every 6 (six) hours as needed for wheezing or shortness of breath.   atorvastatin 40 MG tablet Commonly known as: LIPITOR Take 40 mg by mouth at bedtime.   buPROPion 150 MG 24 hr tablet Commonly known as: WELLBUTRIN XL Take 150 mg by mouth daily.   cetirizine 10 MG tablet Commonly known as: ZYRTEC Take 10 mg by mouth daily.   citalopram 40 MG tablet Commonly known as: CELEXA Take 40 mg by mouth daily.   clonazePAM 1 MG tablet Commonly known as: KLONOPIN Take 1 mg by mouth 2 (two) times daily.   CO  Q-10 PO Take 1 capsule by mouth daily.   cyanocobalamin 1000 MCG tablet Commonly known as: VITAMIN B12 Take 1,000 mcg by mouth daily.   donepezil 5 MG tablet Commonly known as: ARICEPT Take 5 mg by mouth at bedtime.   furosemide 20 MG tablet Commonly known as: LASIX Take 20 mg by mouth daily.   gabapentin 300 MG capsule Commonly known as: NEURONTIN Take 300 mg by mouth in the morning and at bedtime.   HAIR/SKIN/NAILS PO Take 1 tablet by mouth daily.   HYDROcodone-acetaminophen 5-325 MG tablet Commonly known as: Norco Take 1 tablet by mouth every 6 (six) hours as needed for moderate pain or severe pain.   Melatonin 10 MG Tabs Take 10 mg by mouth at bedtime.   methocarbamol 500 MG tablet Commonly known as: Robaxin Take 1 tablet (500 mg total) by mouth every 8 (eight) hours as needed for muscle spasms. What changed:  when to take this reasons to take this   nitroGLYCERIN 0.4 MG SL tablet Commonly known as: NITROSTAT Place 0.4 mg under the tongue every 5 (five) minutes as needed for chest pain.   OIL BASE EX Apply 1 application topically 4 (four) times daily as needed (for pain.).  CBD OIL   olmesartan 40 MG tablet Commonly known as: BENICAR Take 40 mg by mouth daily.   omeprazole 40 MG capsule Commonly known as: PRILOSEC Take 40 mg by mouth daily.   ondansetron 4 MG tablet Commonly known as: Zofran Take 1 tablet (4 mg total) by mouth every 8 (eight) hours as needed for nausea, vomiting or refractory nausea / vomiting.   pioglitazone 15 MG tablet Commonly known as: ACTOS Take 15 mg by mouth daily.   Trulicity 3 CB/7.6EG Sopn Generic drug: Dulaglutide Inject 3 mg into the skin every Wednesday.   Vitamin D3 250 MCG (10000 UT) Tabs Take 1,000 Units by mouth daily.   zinc gluconate 50 MG tablet Take 50 mg by mouth daily.          Signed: Ventura Bruns 10/26/2021, 10:39 AM  [facility] is now [facility] [address], [place], [place], [place] [address] Phone: [place]

## 2021-10-26 NOTE — Progress Notes (Addendum)
Physical Therapy Treatment Patient Details Name: Whitney [name] MRN: [id number] DOB: Aug 06, 1945 Today's Date: 10/26/2021   History of Present Illness Pt s/p L TKR and with hx of CKD, DM, neuropathy, PTSD, Bil RCR, CVA    PT Comments    Diane is progressing with PT slowly. Incr gait distance and slightly incr gait velocity this am.  Pt reports to PT that she got halfway OOB (her legs off the bed) last night and could not get back into bed, stting she struggle for hours however did not think to press the call light and request assistance. Will continue PT in acute setting  Recommendations for follow up therapy are one component of a multi-disciplinary discharge planning process, led by the attending physician.  Recommendations may be updated based on patient status, additional functional criteria and insurance authorization.  Follow Up Recommendations  Follow physician's recommendations for discharge plan and follow up therapies     Assistance Recommended at Discharge Frequent or constant Supervision/Assistance  Patient can return home with the following A little help with bathing/dressing/bathroom;A lot of help with walking and/or transfers;Assistance with cooking/housework;Assist for transportation;Help with stairs or ramp for entrance   Equipment Recommendations  None recommended by PT    Recommendations for Other Services       Precautions / Restrictions Precautions Precautions: Fall Restrictions Weight Bearing Restrictions: No Other Position/Activity Restrictions: WBAT     Mobility  Bed Mobility Overal bed mobility: Needs Assistance Bed Mobility: Supine to Sit     Supine to sit: Min assist     General bed mobility comments: increased time with verbal cues for sequence, and use of UEs to self assist, reliance on rails.  Physical assist to manage L LE    Transfers Overall transfer level: Needs assistance Equipment used: Rolling walker (2 wheels) Transfers: Sit  to/from Stand Sit to Stand: Min assist           General transfer comment: cues for LE management and use of UEs to self assist; min assist to bring wt up and fwd and steady in initial standing.    Ambulation/Gait Ambulation/Gait assistance: Min guard, Min assist    77'        General Gait Details:  cues for posture, sequence, RW position   Stairs             Wheelchair Mobility    Modified Rankin (Stroke Patients Only)       Balance   Sitting-balance support: Feet supported, Bilateral upper extremity supported Sitting balance-Leahy Scale: Fair     Standing balance support: No upper extremity supported, During functional activity, Reliant on assistive device for balance Standing balance-Leahy Scale: Poor                              Cognition Arousal/Alertness: Awake/alert Behavior During Therapy: Flat affect Overall Cognitive Status: Impaired/Different from baseline Area of Impairment: Following commands, Attention, Problem solving                   Current Attention Level: Sustained, Selective   Following Commands: Follows one step commands with increased time, Follows multi-step commands inconsistently     Problem Solving: Slow processing, Decreased initiation, Requires verbal cues          Exercises Total Joint Exercises Ankle Circles/Pumps: AROM, Both, 10 reps Knee Flexion:  (passive prolonged  knee flexion stretch L knee)    General Comments  Pertinent Vitals/Pain Pain Assessment Pain Assessment: Faces Faces Pain Scale: Hurts even more Pain Location: L knee Pain Descriptors / Indicators: Discomfort, Grimacing, Guarding Pain Intervention(s): Limited activity within patient's tolerance, Monitored during session, Premedicated before session, Repositioned    Home Living                          Prior Function            PT Goals (current goals can now be found in the care plan section) Acute  Rehab PT Goals Patient Stated Goal: Regain IND PT Goal Formulation: With patient Time For Goal Achievement: 10/28/21 Potential to Achieve Goals: Good Progress towards PT goals: Progressing toward goals    Frequency    7X/week      PT Plan Current plan remains appropriate    Co-evaluation              AM-PAC PT "6 Clicks" Mobility   Outcome Measure  Help needed turning from your back to your side while in a flat bed without using bedrails?: A Lot Help needed moving from lying on your back to sitting on the side of a flat bed without using bedrails?: A Lot Help needed moving to and from a bed to a chair (including a wheelchair)?: A Lot Help needed standing up from a chair using your arms (e.g., wheelchair or bedside chair)?: A Lot Help needed to walk in hospital room?: Total Help needed climbing 3-5 steps with a railing? : Total 6 Click Score: 10    End of Session Equipment Utilized During Treatment: Gait belt Activity Tolerance: Patient limited by fatigue Patient left: with call bell/phone within reach;in bed;with bed alarm set Nurse Communication: Mobility status PT Visit Diagnosis: Unsteadiness on feet (R26.81);Difficulty in walking, not elsewhere classified (R26.2);Pain Pain - Right/Left: Left Pain - part of body: Knee     Time: 1207-1226 PT Time Calculation (min) (ACUTE ONLY): 19 min  Charges:  $Gait Training: 8-22 mins                     Baxter Flattery, PT  Acute Rehab Dept [facility]) [phone]  [facility] Weekend Pager [facility] only)  [phone]  10/26/2021    Kenyon Ana 10/26/2021, 2:12 PM

## 2021-10-26 NOTE — Progress Notes (Signed)
10/26/21 1600  PT Visit Information  Last PT Received On 10/26/21  Assistance Needed +1  Progressing toward goals, improving knee ROM; Pt continues to require an excessive amount of time to complete basic tasks.  Incontinent of urine in bed; bed change completed by NT; 2 person assist to complete peri-care/brief change Recommend SNF as pt does not have any assistance at home, she is at risk for falls; pt also  managed household tasks prior to surgery as her husband is not able to perform these duties.  History of Present Illness Pt s/p L TKR and with hx of CKD, DM, neuropathy, PTSD, Bil RCR, CVA  Subjective Data  Patient Stated Goal Regain IND  Precautions  Precautions Fall  Restrictions  Weight Bearing Restrictions No  Other Position/Activity Restrictions WBAT  Pain Assessment  Pain Assessment Faces  Faces Pain Scale 6  Pain Location L knee  Pain Descriptors / Indicators Discomfort;Grimacing;Guarding  Pain Intervention(s) Limited activity within patient's tolerance;Monitored during session;Premedicated before session;Repositioned  Cognition  Arousal/Alertness Awake/alert  Behavior During Therapy Flat affect  Overall Cognitive Status Impaired/Different from baseline  Area of Impairment Following commands;Attention;Problem solving  Current Attention Level Selective  Following Commands Follows one step commands with increased time;Follows multi-step commands inconsistently  Problem Solving Slow processing;Decreased initiation;Requires verbal cues;Difficulty sequencing  General Comments word finding problems  Bed Mobility  Overal bed mobility Needs Assistance  Bed Mobility Supine to Sit;Sit to Supine  Supine to sit Min assist  Sit to supine Mod assist  General bed mobility comments increased time with verbal cues for sequence, and use of UEs to self assist, reliance on rails.  Physical assist to manage L LE off bed, assist lifting bil LEs on to bed  Transfers  Overall transfer  level Needs assistance  Equipment used Rolling walker (2 wheels)  Transfers Sit to/from Stand  Sit to Stand Min assist;Min guard  General transfer comment cues for LE management and use of UEs to self assist; min/guard to min   assist to bring wt up and fwd and steady in initial standing. repeated STS d/t incontinence/peri-care  Ambulation/Gait  Ambulation/Gait assistance Min guard;Min assist  Gait Distance (Feet) 40 Feet  Assistive device Rolling walker (2 wheels)  General Gait Details increased time with verbal cues for L knee flexion during swing phase, heel strike, posture   and position from RW.  Gait velocity decr  Balance  Sitting-balance support Feet supported;Bilateral upper extremity supported  Sitting balance-Leahy Scale Fair  Standing balance support Single extremity supported  Standing balance-Leahy Scale Fair  Standing balance comment briefly able to maintain static stand without UE support; reliant on at least unilateral UE support  for dynamic activities  Total Joint Exercises  Ankle Circles/Pumps AROM;Both;10 reps  Quad Sets AROM;Both;10 reps  Heel Slides AAROM;Left;10 reps  Straight Leg Raises AAROM;Left;10 reps  PT - End of Session  Equipment Utilized During Treatment Gait belt  Activity Tolerance Patient limited by fatigue  Patient left with call bell/phone within reach;in bed;with bed alarm set  Nurse Communication Mobility status   PT - Assessment/Plan  PT Plan Current plan remains appropriate  PT Visit Diagnosis Unsteadiness on feet (R26.81);Difficulty in walking, not elsewhere classified (R26.2);Pain  Pain - Right/Left Left  Pain - part of body Knee  PT Frequency (ACUTE ONLY) 7X/week  Follow Up Recommendations Follow physician's recommendations for discharge plan and follow up therapies  Assistance recommended at discharge Frequent or constant Supervision/Assistance  Patient can return home with the following A  little help with bathing/dressing/bathroom;A  lot of help with walking and/or transfers;Assistance with cooking/housework;Assist for transportation;Help with stairs or ramp for entrance  PT equipment None recommended by PT  AM-PAC PT "6 Clicks" Mobility Outcome Measure (Version 2)  Help needed turning from your back to your side while in a flat bed without using bedrails? 2  Help needed moving from lying on your back to sitting on the side of a flat bed without using bedrails? 2  Help needed moving to and from a bed to a chair (including a wheelchair)? 2  Help needed standing up from a chair using your arms (e.g., wheelchair or bedside chair)? 2  Help needed to walk in hospital room? 2  Help needed climbing 3-5 steps with a railing?  1  6 Click Score 11  Consider Recommendation of Discharge To: CIR/SNF/LTACH  Progressive Mobility  What is the highest level of mobility based on the progressive mobility assessment? Level 4 (Walks with assist in room) - Balance while marching in place and cannot step forward and back - Complete  Mobility Referral No  Activity Ambulated with assistance in hallway  PT Goal Progression  Progress towards PT goals Progressing toward goals  Acute Rehab PT Goals  PT Goal Formulation With patient  Time For Goal Achievement 10/28/21  Potential to Achieve Goals Good  PT Time Calculation  PT Start Time (ACUTE ONLY) 1521  PT Stop Time (ACUTE ONLY) 1602  PT Time Calculation (min) (ACUTE ONLY) 41 min  PT General Charges  $$ ACUTE PT VISIT 1 Visit  PT Treatments  $Gait Training 23-37 mins  $Therapeutic Exercise 8-22 mins

## 2021-10-27 NOTE — Progress Notes (Signed)
Physical Therapy Treatment Patient Details Name: Whitney [name] MRN: [id number] DOB: [birth year] Today's Date: 10/27/2021   History of Present Illness Pt s/p L TKR and with hx of CKD, DM, neuropathy, PTSD, Bil RCR, CVA    PT Comments    Pt continues very cooperative and progressing steadily with mobility.  Pt progressing distance ambulated but continues to struggle with bed mobility to exit and enter bed.   Recommendations for follow up therapy are one component of a multi-disciplinary discharge planning process, led by the attending physician.  Recommendations may be updated based on patient status, additional functional criteria and insurance authorization.  Follow Up Recommendations  Follow physician's recommendations for discharge plan and follow up therapies Can patient physically be transported by private vehicle: No   Assistance Recommended at Discharge Frequent or constant Supervision/Assistance  Patient can return home with the following A little help with bathing/dressing/bathroom;A lot of help with walking and/or transfers;Assistance with cooking/housework;Assist for transportation;Help with stairs or ramp for entrance   Equipment Recommendations  None recommended by PT    Recommendations for Other Services       Precautions / Restrictions Precautions Precautions: Fall;Knee Precaution Booklet Issued: No Restrictions Weight Bearing Restrictions: No LLE Weight Bearing: Weight bearing as tolerated Other Position/Activity Restrictions: WBAT     Mobility  Bed Mobility Overal bed mobility: Needs Assistance Bed Mobility: Sit to Supine, Supine to Sit     Supine to sit: Min assist Sit to supine: Min assist, Mod assist   General bed mobility comments: Increased time with cues for sequence, assist with L LE OOB and bil LEs into bed    Transfers Overall transfer level: Needs assistance Equipment used: Rolling walker (2 wheels) Transfers: Sit to/from Stand Sit to  Stand: Min guard           General transfer comment: Steady assist with cues for LE management and use of UEs to self assist    Ambulation/Gait Ambulation/Gait assistance: Min guard Gait Distance (Feet): 55 Feet (twice) Assistive device: Rolling walker (2 wheels) Gait Pattern/deviations: Decreased step length - right, Decreased step length - left, Wide base of support, Step-to pattern, Step-through pattern Gait velocity: decr     General Gait Details: increased time with verbal cues for L knee flexion during swing phase, heel strike, posture   and position from RW.   Stairs             Wheelchair Mobility    Modified Rankin (Stroke Patients Only)       Balance Overall balance assessment: Needs assistance Sitting-balance support: Feet supported, Bilateral upper extremity supported Sitting balance-Leahy Scale: Good     Standing balance support: Single extremity supported Standing balance-Leahy Scale: Poor Standing balance comment: briefly able to maintain static stand without UE support; reliant on at least unilateral UE support  for dynamic activities                            Cognition Arousal/Alertness: Awake/alert Behavior During Therapy: WFL for tasks assessed/performed Overall Cognitive Status: Within Functional Limits for tasks assessed                                 General Comments: pleasant, able to follow 1-2 step commands        Exercises Total Joint Exercises Ankle Circles/Pumps: AROM, Both, 20 reps, Supine Quad Sets: AROM, Both, 10 reps Heel Slides:  AAROM, Left, 20 reps, Supine Hip ABduction/ADduction: AAROM, 10 reps, Both, AROM Straight Leg Raises: AAROM, Left, 15 reps, Supine Goniometric ROM: AAROM L knee -8 - 50    General Comments        Pertinent Vitals/Pain Pain Assessment Pain Assessment: 0-10 Pain Score: 5  Pain Location: L knee Pain Descriptors / Indicators: Grimacing, Aching, Sore Pain  Intervention(s): Limited activity within patient's tolerance, Monitored during session, Premedicated before session, Ice applied    Home Living                          Prior Function            PT Goals (current goals can now be found in the care plan section) Acute Rehab PT Goals Patient Stated Goal: Regain IND PT Goal Formulation: With patient Time For Goal Achievement: 10/28/21 Potential to Achieve Goals: Good Progress towards PT goals: Progressing toward goals    Frequency    7X/week      PT Plan Current plan remains appropriate    Co-evaluation              AM-PAC PT "6 Clicks" Mobility   Outcome Measure  Help needed turning from your back to your side while in a flat bed without using bedrails?: A Lot Help needed moving from lying on your back to sitting on the side of a flat bed without using bedrails?: A Lot Help needed moving to and from a bed to a chair (including a wheelchair)?: A Lot Help needed standing up from a chair using your arms (e.g., wheelchair or bedside chair)?: A Little Help needed to walk in hospital room?: A Little Help needed climbing 3-5 steps with a railing? : A Lot 6 Click Score: 14    End of Session Equipment Utilized During Treatment: Gait belt Activity Tolerance: Patient tolerated treatment well;Patient limited by fatigue Patient left: in bed;with call bell/phone within reach;with bed alarm set Nurse Communication: Mobility status PT Visit Diagnosis: Unsteadiness on feet (R26.81);Difficulty in walking, not elsewhere classified (R26.2);Pain Pain - Right/Left: Left Pain - part of body: Knee     Time: 3559-7416 PT Time Calculation (min) (ACUTE ONLY): 33 min  Charges:  $Gait Training: 23-37 mins $Therapeutic Exercise: 23-37 mins                     Debe Coder PT Acute Rehabilitation Services Pager [phone] Office [phone]    Whitney [name] 10/27/2021, 3:51 PM

## 2021-10-27 NOTE — Progress Notes (Signed)
Orthopedics Progress Note  Subjective: Patient reports doing well with walking today.  Still needs assistance dressing  Objective:  Vitals:   10/27/21 0440 10/27/21 0912  BP: (!) 137/59 (!) 142/55  Pulse:  63  Resp:  17  Temp:  98.2 F (36.8 C)  SpO2:  93%    General: Awake and alert  Musculoskeletal: left knee with Aquacel in place. Good quad set and no pain with gentle AROM. Neg Homans Neurovascularly intact  Lab Results  Component Value Date   WBC 8.1 10/23/2021   HGB 9.6 (L) 10/23/2021   HCT 31.0 (L) 10/23/2021   MCV 96.6 10/23/2021   PLT 148 (L) 10/23/2021       Component Value Date/Time   NA 140 10/22/2021 0355   K 4.0 10/22/2021 0355   CL 109 10/22/2021 0355   CO2 21 (L) 10/22/2021 0355   GLUCOSE 313 (H) 10/22/2021 0355   BUN 33 (H) 10/22/2021 0355   CREATININE 1.76 (H) 10/22/2021 0355   CALCIUM 8.6 (L) 10/22/2021 0355   GFRNONAA 29 (L) 10/22/2021 0355   GFRAA 74 (L) 11/29/2013 0511    No results found for: "INR", "PROTIME"  Assessment/Plan: POD #6 s/p Procedure(s): TOTAL KNEE ARTHROPLASTY Stable from ortho standpoint. Patient still doing her therapy. She is progressing with PT and OT. Still needing assistance. PT note not in today yet.  Awaiting insurance auth for bed at [facility]. She is getting closer to being independent with ADLs and mobility  Doran Heater. Veverly Fells, MD 10/27/2021 12:16 PM

## 2021-10-27 NOTE — Progress Notes (Signed)
Physical Therapy Treatment Patient Details Name: Whitney [name] MRN: [id number] DOB: 06/20/1945 Today's Date: 10/27/2021   History of Present Illness Pt s/p L TKR and with hx of CKD, DM, neuropathy, PTSD, Bil RCR, CVA    PT Comments    Pt very cooperative and progressing steadily with mobility but requiring increased time and multiple rest breaks for task completion.  This am, pt up to ambulate increased distance in hall and with noted improvement in L knee ROM.  Recommendations for follow up therapy are one component of a multi-disciplinary discharge planning process, led by the attending physician.  Recommendations may be updated based on patient status, additional functional criteria and insurance authorization.  Follow Up Recommendations  Follow physician's recommendations for discharge plan and follow up therapies Can patient physically be transported by private vehicle: No   Assistance Recommended at Discharge Frequent or constant Supervision/Assistance  Patient can return home with the following A little help with bathing/dressing/bathroom;A lot of help with walking and/or transfers;Assistance with cooking/housework;Assist for transportation;Help with stairs or ramp for entrance   Equipment Recommendations  None recommended by PT    Recommendations for Other Services       Precautions / Restrictions Precautions Precautions: Fall Precaution Booklet Issued: No Restrictions Weight Bearing Restrictions: No LLE Weight Bearing: Weight bearing as tolerated Other Position/Activity Restrictions: WBAT     Mobility  Bed Mobility               General bed mobility comments: Up in chair with OT    Transfers Overall transfer level: Needs assistance Equipment used: Rolling walker (2 wheels) Transfers: Sit to/from Stand Sit to Stand: Min guard           General transfer comment: Steady assist withj cues for LE management and use of UEs to self assist     Ambulation/Gait Ambulation/Gait assistance: Min assist, Min guard Gait Distance (Feet): 45 Feet (twice) Assistive device: Rolling walker (2 wheels) Gait Pattern/deviations: Step-to pattern, Decreased step length - right, Decreased step length - left, Wide base of support Gait velocity: decr     General Gait Details: increased time with verbal cues for L knee flexion during swing phase, heel strike, posture   and position from RW.   Stairs             Wheelchair Mobility    Modified Rankin (Stroke Patients Only)       Balance Overall balance assessment: Needs assistance Sitting-balance support: Feet supported, Bilateral upper extremity supported Sitting balance-Leahy Scale: Good     Standing balance support: Single extremity supported Standing balance-Leahy Scale: Poor                              Cognition Arousal/Alertness: Awake/alert Behavior During Therapy: Flat affect Overall Cognitive Status: Within Functional Limits for tasks assessed                                 General Comments: pleasant, able to follow 1-2 step commands        Exercises Total Joint Exercises Ankle Circles/Pumps: AROM, Both, 20 reps, Supine Quad Sets: AROM, Both, 10 reps Heel Slides: AAROM, Left, 20 reps, Supine Hip ABduction/ADduction: AAROM, 10 reps, Both, AROM Straight Leg Raises: AAROM, Left, 15 reps, Supine Goniometric ROM: AAROM L knee -8 - 50    General Comments  Pertinent Vitals/Pain Pain Assessment Pain Assessment: 0-10 Pain Score: 4  Pain Location: L knee Pain Descriptors / Indicators: Grimacing, Aching, Sore Pain Intervention(s): Limited activity within patient's tolerance, Monitored during session, Premedicated before session, Ice applied    Home Living                          Prior Function            PT Goals (current goals can now be found in the care plan section) Acute Rehab PT Goals Patient Stated  Goal: Regain IND PT Goal Formulation: With patient Time For Goal Achievement: 10/28/21 Potential to Achieve Goals: Good Progress towards PT goals: Progressing toward goals    Frequency    7X/week      PT Plan Current plan remains appropriate    Co-evaluation              AM-PAC PT "6 Clicks" Mobility   Outcome Measure  Help needed turning from your back to your side while in a flat bed without using bedrails?: A Lot Help needed moving from lying on your back to sitting on the side of a flat bed without using bedrails?: A Lot Help needed moving to and from a bed to a chair (including a wheelchair)?: A Lot Help needed standing up from a chair using your arms (e.g., wheelchair or bedside chair)?: A Little Help needed to walk in hospital room?: A Little Help needed climbing 3-5 steps with a railing? : A Lot 6 Click Score: 14    End of Session Equipment Utilized During Treatment: Gait belt Activity Tolerance: Patient tolerated treatment well;Patient limited by fatigue Patient left: in chair;with call bell/phone within reach;with chair alarm set Nurse Communication: Mobility status PT Visit Diagnosis: Unsteadiness on feet (R26.81);Difficulty in walking, not elsewhere classified (R26.2);Pain Pain - Right/Left: Left Pain - part of body: Knee     Time: 1001-1100 PT Time Calculation (min) (ACUTE ONLY): 59 min  Charges:  $Gait Training: 23-37 mins $Therapeutic Exercise: 23-37 mins                     Debe Coder PT Acute Rehabilitation Services Pager [phone] Office [phone]    Ikey Omary 10/27/2021, 2:54 PM

## 2021-10-27 NOTE — Progress Notes (Signed)
Orthopedic Tech Progress Note Patient Details:  Whitney [name] 1945-02-12 [id number]  Patient ID: Whitney [name], female   DOB: 1945-06-25, 76 y.o.   MRN: [id number]  Whitney [name] 10/27/2021, 4:27 PM Cpm applied

## 2021-10-27 NOTE — Progress Notes (Signed)
Orthopedic Tech Progress Note Patient Details:  Whitney [name] Jul 04, 1945 [id number]  Patient ID: Whitney [name], female   DOB: Jun 10, 1945, 76 y.o.   MRN: [id number]  Whitney [name] 10/27/2021, 6:22 PM Cpm removed

## 2021-10-27 NOTE — Care Management Important Message (Signed)
Important Message  Patient Details IM Letter given to the Patient. Name: Whitney [name] MRN: [id number] Date of Birth: 12-30-1945   Medicare Important Message Given:  Yes     Marcia, [place] 10/27/2021, 2:36 PM

## 2021-10-27 NOTE — Plan of Care (Signed)
  Problem: Education: Goal: Knowledge of the prescribed therapeutic regimen will improve Outcome: Progressing   Problem: Activity: Goal: Ability to avoid complications of mobility impairment will improve Outcome: Progressing Goal: Range of joint motion will improve Outcome: Progressing   Problem: Clinical Measurements: Goal: Postoperative complications will be avoided or minimized Outcome: Progressing   Problem: Pain Management: Goal: Pain level will decrease with appropriate interventions Outcome: Progressing   Problem: Skin Integrity: Goal: Will show signs of wound healing Outcome: Progressing   Problem: Clinical Measurements: Goal: Ability to maintain clinical measurements within normal limits will improve Outcome: Progressing Goal: Will remain free from infection Outcome: Progressing Goal: Diagnostic test results will improve Outcome: Progressing Goal: Respiratory complications will improve Outcome: Progressing Goal: Cardiovascular complication will be avoided Outcome: Progressing   Problem: Activity: Goal: Risk for activity intolerance will decrease Outcome: Progressing   Problem: Nutrition: Goal: Adequate nutrition will be maintained Outcome: Progressing   Problem: Coping: Goal: Level of anxiety will decrease Outcome: Progressing   Problem: Pain Managment: Goal: General experience of comfort will improve Outcome: Progressing   Problem: Safety: Goal: Ability to remain free from injury will improve Outcome: Progressing   Problem: Skin Integrity: Goal: Risk for impaired skin integrity will decrease Outcome: Progressing

## 2021-10-27 NOTE — Progress Notes (Signed)
Occupational Therapy Treatment Patient Details Name: Whitney [name] MRN: [id number] DOB: 1945/11/08 Today's Date: 10/27/2021   History of present illness Pt s/p L TKR and with hx of CKD, DM, neuropathy, PTSD, Bil RCR, CVA   OT comments  Patient was motivated to participate in the therapy session & she is progressing towards her OT goals. She required min guard assist to stand using a RW, max assist for lower body dressing, and mod assist for toileting at bathroom level. She further performed grooming in standing at the sink, with no significant loss of balance noted. She required occasional cues for sequencing and general safety during functional activities. Continue OT plan of care.    Recommendations for follow up therapy are one component of a multi-disciplinary discharge planning process, led by the attending physician.  Recommendations may be updated based on patient status, additional functional criteria and insurance authorization.    Follow Up Recommendations  Skilled nursing-short term rehab (<3 hours/day)    Assistance Recommended at Discharge Intermittent Supervision/Assistance  Patient can return home with the following  A little help with walking and/or transfers;A little help with bathing/dressing/bathroom;Assistance with cooking/housework   Equipment Recommendations  BSC/3in1       Precautions / Restrictions Restrictions LLE Weight Bearing: Weight bearing as tolerated Other Position/Activity Restrictions: WBAT       Mobility Bed Mobility Overal bed mobility: Needs Assistance Bed Mobility: Supine to Sit     Supine to sit: Min assist     General bed mobility comments: assist required for L LE management, HOB elevated    Transfers Overall transfer level: Needs assistance Equipment used: Rolling walker (2 wheels) Transfers: Sit to/from Stand Sit to Stand: Min guard              ADL either performed or assessed with clinical judgement   ADL Overall  ADL's : Needs assistance/impaired Eating/Feeding: Independent Eating/Feeding Details (indicate cue type and reason): based on clinical judgement Grooming: Wash/dry face;Oral care;Wash/dry hands;Brushing hair;Min guard;Standing Grooming Details (indicate cue type and reason): She performed grooming at sink level. She required min verbal cues to step closer to the sink while performing tasks, for added safety and support of sink as needed.             Lower Body Dressing: Maximal assistance Lower Body Dressing Details (indicate cue type and reason): She required increased assist for donning her socks seated EOB. Toilet Transfer: Min guard;Cueing for safety;Comfort height toilet;Ambulation;Rolling walker (2 wheels) Toilet Transfer Details (indicate cue type and reason): She was instructed to step back closer to the toilet, prior to sitting, as well as to reach for toilet rail. Toileting- Clothing Manipulation and Hygiene: Cueing for compensatory techniques;Moderate assistance Toileting - Clothing Manipulation Details (indicate cue type and reason): She performed seated hygiene with SBA after urinating. Once, in standing, she required assist to pull her underwear up over her hips with verbal cues provided to hold onto the RW with 1 upper extremity.             Cognition Arousal/Alertness: Awake/alert            General Comments: pleasant, able to follow 1-2 step commands                   Pertinent Vitals/ Pain       Pain Assessment Pain Assessment: No/denies pain         Frequency  Min 2X/week        Progress Toward Goals  OT Goals(current goals can now be found in the care plan section)  Progress towards OT goals: Progressing toward goals  Acute Rehab OT Goals OT Goal Formulation: With patient Time For Goal Achievement: 11/08/21  Plan      AM-PAC OT "6 Clicks" Daily Activity     Outcome Measure   Help from another person eating meals?: None Help from  another person taking care of personal grooming?: A Little Help from another person toileting, which includes using toliet, bedpan, or urinal?: A Lot Help from another person bathing (including washing, rinsing, drying)?: A Lot Help from another person to put on and taking off regular upper body clothing?: None Help from another person to put on and taking off regular lower body clothing?: A Lot 6 Click Score: 17    End of Session Equipment Utilized During Treatment: Rolling walker (2 wheels)  OT Visit Diagnosis: Unsteadiness on feet (R26.81);Muscle weakness (generalized) (M62.81)   Activity Tolerance Patient tolerated treatment well   Patient Left in chair;with call bell/phone within reach   Nurse Communication  (Nurse cleared the pt for participation in the session)        Time: [phone] OT Time Calculation (min): 29 min  Charges: OT General Charges $OT Visit: 1 Visit OT Treatments $Self Care/Home Management : 8-22 mins $Therapeutic Activity: 8-22 mins    Leota Sauers, OTR/L 10/27/2021, 10:13 AM

## 2021-10-28 DIAGNOSIS — R262 Difficulty in walking, not elsewhere classified: Secondary | ICD-10-CM | POA: Diagnosis not present

## 2021-10-28 DIAGNOSIS — F32A Depression, unspecified: Secondary | ICD-10-CM | POA: Diagnosis not present

## 2021-10-28 DIAGNOSIS — M6281 Muscle weakness (generalized): Secondary | ICD-10-CM | POA: Diagnosis not present

## 2021-10-28 DIAGNOSIS — K219 Gastro-esophageal reflux disease without esophagitis: Secondary | ICD-10-CM | POA: Diagnosis not present

## 2021-10-28 DIAGNOSIS — D649 Anemia, unspecified: Secondary | ICD-10-CM | POA: Diagnosis not present

## 2021-10-28 DIAGNOSIS — F419 Anxiety disorder, unspecified: Secondary | ICD-10-CM | POA: Diagnosis not present

## 2021-10-28 DIAGNOSIS — D849 Immunodeficiency, unspecified: Secondary | ICD-10-CM | POA: Diagnosis not present

## 2021-10-28 DIAGNOSIS — G8929 Other chronic pain: Secondary | ICD-10-CM | POA: Diagnosis not present

## 2021-10-28 DIAGNOSIS — K580 Irritable bowel syndrome with diarrhea: Secondary | ICD-10-CM | POA: Diagnosis not present

## 2021-10-28 DIAGNOSIS — F4310 Post-traumatic stress disorder, unspecified: Secondary | ICD-10-CM | POA: Diagnosis not present

## 2021-10-28 DIAGNOSIS — N189 Chronic kidney disease, unspecified: Secondary | ICD-10-CM | POA: Diagnosis not present

## 2021-10-28 DIAGNOSIS — R2689 Other abnormalities of gait and mobility: Secondary | ICD-10-CM | POA: Diagnosis not present

## 2021-10-28 DIAGNOSIS — Z471 Aftercare following joint replacement surgery: Secondary | ICD-10-CM | POA: Diagnosis not present

## 2021-10-28 DIAGNOSIS — Z96652 Presence of left artificial knee joint: Secondary | ICD-10-CM | POA: Diagnosis not present

## 2021-10-28 DIAGNOSIS — E119 Type 2 diabetes mellitus without complications: Secondary | ICD-10-CM | POA: Diagnosis not present

## 2021-10-28 DIAGNOSIS — M1712 Unilateral primary osteoarthritis, left knee: Secondary | ICD-10-CM | POA: Diagnosis not present

## 2021-10-28 DIAGNOSIS — Z7401 Bed confinement status: Secondary | ICD-10-CM | POA: Diagnosis not present

## 2021-10-28 DIAGNOSIS — G8918 Other acute postprocedural pain: Secondary | ICD-10-CM | POA: Diagnosis not present

## 2021-10-28 DIAGNOSIS — I10 Essential (primary) hypertension: Secondary | ICD-10-CM | POA: Diagnosis not present

## 2021-10-28 DIAGNOSIS — Z4789 Encounter for other orthopedic aftercare: Secondary | ICD-10-CM | POA: Diagnosis not present

## 2021-10-28 MED ORDER — HYDROCODONE-ACETAMINOPHEN 5-325 MG PO TABS: 1.0000 | ORAL_TABLET | Freq: Four times a day (QID) | ORAL | 0 refills | Status: DC | PRN

## 2021-10-28 NOTE — Plan of Care (Signed)
  Problem: Education: Goal: Knowledge of the prescribed therapeutic regimen will improve 10/28/2021 1659 by Keturah Shavers, RN Outcome: Adequate for Discharge  Problem: Activity: Goal: Ability to avoid complications of mobility impairment will improve 10/28/2021 1659 by Keturah Shavers, RN Outcome: Adequate for Discharge

## 2021-10-28 NOTE — TOC Progression Note (Addendum)
Transition of Care [facility]) - Progression Note    Patient Details  Name: Whitney [name] MRN: [id number] Date of Birth: 12-03-1945  Transition of [facility]) CM/SW Contact  Lennart Pall, LCSW Phone Number: 10/28/2021, 9:27 AM  Clinical Narrative:    Received call from [place] with [employer] noting that pt's ins coverage for SNF is under review and will need a peer-to-peer call from Dr. Veverly Fells to Dr. Lynder Parents at [phone].  Have alerted MD and PA.  Pt has accepted SNF bed at [facility], however, await ins auth still.    Barriers to Discharge: Barriers Resolved  Expected Discharge Plan and Services           Expected Discharge Date: 10/28/21               DME Arranged: N/A DME Agency: NA                   Social Determinants of Health (SDOH) Interventions    Readmission Risk Interventions     No data to display

## 2021-10-28 NOTE — Progress Notes (Signed)
Physical Therapy Treatment Patient Details Name: Whitney [name] MRN: [id number] DOB: Sep 26, 1945 Today's Date: 10/28/2021   History of Present Illness Pt s/p L TKR and with hx of CKD, DM, neuropathy, PTSD, Bil RCR, CVA    PT Comments    Pt continues very cooperative and progressing steadily with mobility with noted increasing distance walked and decreased level of assist for all activities.  Pt would benefit from ST- SNF level rehab as she needs to be completely IND/MOD I with all tasks including bathing/dressing in order to return  home - spouse is bil amputee with hx of multiple falls from [facility].  Recommendations for follow up therapy are one component of a multi-disciplinary discharge planning process, led by the attending physician.  Recommendations may be updated based on patient status, additional functional criteria and insurance authorization.  Follow Up Recommendations  Follow physician's recommendations for discharge plan and follow up therapies Can patient physically be transported by private vehicle: No   Assistance Recommended at Discharge Frequent or constant Supervision/Assistance  Patient can return home with the following A little help with bathing/dressing/bathroom;A lot of help with walking and/or transfers;Assistance with cooking/housework;Assist for transportation;Help with stairs or ramp for entrance   Equipment Recommendations  None recommended by PT    Recommendations for Other Services       Precautions / Restrictions Precautions Precautions: Fall;Knee Precaution Booklet Issued: No Restrictions Weight Bearing Restrictions: No LLE Weight Bearing: Weight bearing as tolerated     Mobility  Bed Mobility Overal bed mobility: Needs Assistance Bed Mobility: Supine to Sit     Supine to sit: Min guard     General bed mobility comments: Increased time with cues for sequence and use of R LE to self assist    Transfers Overall transfer level: Needs  assistance Equipment used: Rolling walker (2 wheels) Transfers: Sit to/from Stand Sit to Stand: Min guard           General transfer comment: Steady assist with cues for LE management and use of UEs to self assist    Ambulation/Gait Ambulation/Gait assistance: Min guard Gait Distance (Feet): 65 Feet (twice) Assistive device: Rolling walker (2 wheels) Gait Pattern/deviations: Decreased step length - right, Decreased step length - left, Wide base of support, Step-to pattern, Step-through pattern Gait velocity: decr     General Gait Details: increased time with verbal cues for L knee flexion during swing phase, heel strike, posture   and position from RW.   Stairs             Wheelchair Mobility    Modified Rankin (Stroke Patients Only)       Balance Overall balance assessment: Needs assistance Sitting-balance support: Feet supported, Bilateral upper extremity supported Sitting balance-Leahy Scale: Good     Standing balance support: Single extremity supported Standing balance-Leahy Scale: Poor Standing balance comment: briefly able to maintain static stand without UE support; reliant on at least unilateral UE support  for dynamic activities                            Cognition Arousal/Alertness: Awake/alert Behavior During Therapy: [facility] for tasks assessed/performed                                            Exercises Total Joint Exercises Ankle Circles/Pumps: AROM, Both, 20 reps, Supine Quad  Sets: AROM, Both, 10 reps Heel Slides: AAROM, Left, 20 reps, Supine Hip ABduction/ADduction: AAROM, 10 reps, Both, AROM Straight Leg Raises: AAROM, Left, 15 reps, Supine Goniometric ROM: AAROM L knee -5 - 55    General Comments        Pertinent Vitals/Pain Pain Assessment Pain Assessment: 0-10 Pain Score: 5  Pain Location: L knee Pain Descriptors / Indicators: Grimacing, Aching, Sore Pain Intervention(s): Premedicated before  session, Monitored during session, Limited activity within patient's tolerance, Ice applied    Home Living                          Prior Function            PT Goals (current goals can now be found in the care plan section) Acute Rehab PT Goals Patient Stated Goal: Regain IND PT Goal Formulation: With patient Time For Goal Achievement: 10/28/21 Potential to Achieve Goals: Good Progress towards PT goals: Progressing toward goals    Frequency    7X/week      PT Plan Current plan remains appropriate    Co-evaluation              AM-PAC PT "6 Clicks" Mobility   Outcome Measure  Help needed turning from your back to your side while in a flat bed without using bedrails?: A Lot Help needed moving from lying on your back to sitting on the side of a flat bed without using bedrails?: A Little Help needed moving to and from a bed to a chair (including a wheelchair)?: A Little Help needed standing up from a chair using your arms (e.g., wheelchair or bedside chair)?: A Little Help needed to walk in hospital room?: A Little Help needed climbing 3-5 steps with a railing? : A Lot 6 Click Score: 16    End of Session Equipment Utilized During Treatment: Gait belt Activity Tolerance: Patient tolerated treatment well;Patient limited by fatigue Patient left: in chair;with call bell/phone within reach;with chair alarm set Nurse Communication: Mobility status PT Visit Diagnosis: Unsteadiness on feet (R26.81);Difficulty in walking, not elsewhere classified (R26.2);Pain Pain - Right/Left: Left Pain - part of body: Knee     Time: 3536-1443 PT Time Calculation (min) (ACUTE ONLY): 41 min  Charges:  $Gait Training: 23-37 mins $Therapeutic Exercise: 8-22 mins                     Debe Coder PT Acute Rehabilitation Services Pager [phone] Office [phone]    Whitney [name] 10/28/2021, 1:06 PM

## 2021-10-28 NOTE — Progress Notes (Signed)
Physical Therapy Treatment Patient Details Name: Whitney [name] MRN: [id number] DOB: 1945/04/17 Today's Date: 10/28/2021   History of Present Illness Pt s/p L TKR and with hx of CKD, DM, neuropathy, PTSD, Bil RCR, CVA    PT Comments    Pt in good spirits and continues steady progress with mobility with noted increasing activity tolerance and decreasing level of assist for most tasks.   Recommendations for follow up therapy are one component of a multi-disciplinary discharge planning process, led by the attending physician.  Recommendations may be updated based on patient status, additional functional criteria and insurance authorization.  Follow Up Recommendations  Follow physician's recommendations for discharge plan and follow up therapies Can patient physically be transported by private vehicle: No   Assistance Recommended at Discharge Frequent or constant Supervision/Assistance  Patient can return home with the following A little help with bathing/dressing/bathroom;A lot of help with walking and/or transfers;Assistance with cooking/housework;Assist for transportation;Help with stairs or ramp for entrance   Equipment Recommendations  None recommended by PT    Recommendations for Other Services       Precautions / Restrictions Precautions Precautions: Fall;Knee Precaution Booklet Issued: No Restrictions Weight Bearing Restrictions: No LLE Weight Bearing: Weight bearing as tolerated     Mobility  Bed Mobility Overal bed mobility: Needs Assistance Bed Mobility: Supine to Sit     Supine to sit: Min guard     General bed mobility comments: Pt in chair on arrival and in bathroom at session end    Transfers Overall transfer level: Needs assistance Equipment used: Rolling walker (2 wheels) Transfers: Sit to/from Stand Sit to Stand: Min guard           General transfer comment: Steady assist with cues for LE management and use of UEs to self assist     Ambulation/Gait Ambulation/Gait assistance: Min guard Gait Distance (Feet): 75 Feet (twice) Assistive device: Rolling walker (2 wheels) Gait Pattern/deviations: Decreased step length - right, Decreased step length - left, Wide base of support, Step-to pattern, Step-through pattern Gait velocity: decr     General Gait Details: increased time with verbal cues for L knee flexion during swing phase, heel strike, posture   and position from RW.   Stairs             Wheelchair Mobility    Modified Rankin (Stroke Patients Only)       Balance Overall balance assessment: Needs assistance Sitting-balance support: Feet supported, Bilateral upper extremity supported Sitting balance-Leahy Scale: Good     Standing balance support: No upper extremity supported Standing balance-Leahy Scale: Fair Standing balance comment: briefly able to maintain static stand without UE support; reliant on at least unilateral UE support  for dynamic activities                            Cognition Arousal/Alertness: Awake/alert Behavior During Therapy: WFL for tasks assessed/performed Overall Cognitive Status: Within Functional Limits for tasks assessed                                          Exercises Total Joint Exercises Ankle Circles/Pumps: AROM, Both, 20 reps, Supine Quad Sets: AROM, Both, 10 reps Heel Slides: AAROM, Left, 20 reps, Supine Hip ABduction/ADduction: AAROM, 10 reps, Both, AROM Straight Leg Raises: AAROM, Left, 15 reps, Supine Goniometric ROM: AAROM L knee -5 -  55    General Comments        Pertinent Vitals/Pain Pain Assessment Pain Assessment: 0-10 Pain Score: 6  Pain Location: L knee Pain Descriptors / Indicators: Grimacing, Aching, Sore Pain Intervention(s): Limited activity within patient's tolerance, Monitored during session, Premedicated before session    Home Living                          Prior Function             PT Goals (current goals can now be found in the care plan section) Acute Rehab PT Goals Patient Stated Goal: Regain IND PT Goal Formulation: With patient Time For Goal Achievement: 10/28/21 Potential to Achieve Goals: Good Progress towards PT goals: Progressing toward goals    Frequency    7X/week      PT Plan Current plan remains appropriate    Co-evaluation              AM-PAC PT "6 Clicks" Mobility   Outcome Measure  Help needed turning from your back to your side while in a flat bed without using bedrails?: A Lot Help needed moving from lying on your back to sitting on the side of a flat bed without using bedrails?: A Little Help needed moving to and from a bed to a chair (including a wheelchair)?: A Little Help needed standing up from a chair using your arms (e.g., wheelchair or bedside chair)?: A Little Help needed to walk in hospital room?: A Little Help needed climbing 3-5 steps with a railing? : A Lot 6 Click Score: 16    End of Session Equipment Utilized During Treatment: Gait belt Activity Tolerance: Patient tolerated treatment well;Patient limited by fatigue Patient left: Other (comment) (bathroom) Nurse Communication: Mobility status PT Visit Diagnosis: Unsteadiness on feet (R26.81);Difficulty in walking, not elsewhere classified (R26.2);Pain Pain - Right/Left: Left Pain - part of body: Knee     Time: 1343-1410 PT Time Calculation (min) (ACUTE ONLY): 27 min  Charges:  $Gait Training: 23-37 mins $Therapeutic Exercise: 8-22 mins                     Debe Coder PT Acute Rehabilitation Services Pager [phone] Office [phone]    Jocee Kissick 10/28/2021, 2:43 PM

## 2021-10-28 NOTE — Progress Notes (Signed)
Orthopedics Progress Note  Subjective: Patient continues to improve with ambulation and pain is tolerable  Objective:  Vitals:   10/27/21 2208 10/28/21 0426  BP: (!) 163/71 (!) 158/62  Pulse: 70 69  Resp: 18 18  Temp: 98.3 F (36.8 C) 97.7 F (36.5 C)  SpO2: 94% 94%    General: Awake and alert  Musculoskeletal: Left knee incision CDI. Dressing removed. Neg Homans Neurovascularly intact  Lab Results  Component Value Date   WBC 8.1 10/23/2021   HGB 9.6 (L) 10/23/2021   HCT 31.0 (L) 10/23/2021   MCV 96.6 10/23/2021   PLT 148 (L) 10/23/2021       Component Value Date/Time   NA 140 10/22/2021 0355   K 4.0 10/22/2021 0355   CL 109 10/22/2021 0355   CO2 21 (L) 10/22/2021 0355   GLUCOSE 313 (H) 10/22/2021 0355   BUN 33 (H) 10/22/2021 0355   CREATININE 1.76 (H) 10/22/2021 0355   CALCIUM 8.6 (L) 10/22/2021 0355   GFRNONAA 29 (L) 10/22/2021 0355   GFRAA 74 (L) 11/29/2013 0511    No results found for: "INR", "PROTIME"  Assessment/Plan: POD #7 s/p Procedure(s): TOTAL KNEE ARTHROPLASTY D/C remains pending due to insurance. (Bundle) Working with therapy to either discharge to short term rehab or Home if she can clear PT (She has limited help at home - husband is w/c bound and son is in [place]) Follow up with me in the office in two weeks  Doran Heater. Veverly Fells, MD 10/28/2021 6:14 AM

## 2021-10-28 NOTE — Discharge Summary (Signed)
In most cases prophylactic antibiotics for Dental procdeures after total joint surgery are not necessary.  Exceptions are as follows:  1. History of prior total joint infection  2. Severely immunocompromised (Organ Transplant, cancer chemotherapy, Rheumatoid biologic meds such as [place])  3. Poorly controlled diabetes (A1C &gt; 8.0, blood glucose over 200)  If you have one of these conditions, contact your surgeon for an antibiotic prescription, prior to your dental procedure. Orthopedic Discharge Summary        Physician Discharge Summary  Patient ID: Whitney [name] MRN: [id number] DOB/AGE: 06-20-1945 76 y.o.  Admit date: 10/21/2021 Discharge date: 10/28/2021   Procedures:  Procedure(s) (LRB): TOTAL KNEE ARTHROPLASTY (Left)  Attending Physician:  Dr. Esmond Plants  Admission Diagnoses:   Left knee OA end stage  Discharge Diagnoses:  same   Past Medical History:  Diagnosis Date   Anxiety    Arthritis    Asthma    Cancer ([place])    skin cancer- face-    Chronic kidney disease    CKD   Complication of anesthesia    Concussion    Depression    Diabetes mellitus without complication ([place])    GERD (gastroesophageal reflux disease)    Hypertension    IBS (irritable bowel syndrome)    diarrhea   MRSA (methicillin resistant Staphylococcus aureus)    after skin cancer removed from face.   Neuropathy    Pneumonia    PONV (postoperative nausea and vomiting)    years ago   PTSD (post-traumatic stress disorder)    Stroke [facility])    "they think a silent one"    PCP: Ronita Hipps, MD   Discharged Condition: good  Hospital Course:  Patient underwent the above stated procedure on 10/21/2021. Patient tolerated the procedure well and brought to the recovery room in good condition and subsequently to the floor. Patient had an uncomplicated hospital course and was stable for discharge.   Disposition: Discharge disposition: 03-Skilled Nursing Facility       with follow up in 2 weeks    Contact information for follow-up providers     Netta Cedars, MD. Go on 11/03/2021.   Specialty: Orthopedic Surgery Why: You are scheduled for first post op appointment on Thursday October 12th at 9:30am. Contact information: [address] [place] [address] [phone]         [facility] [place]. Go on 10/24/2021.   Why: You are scheduled for physical therapy evaluation on Monday October 2nd at [facility] information: [place] [id number] [phone]         [facility], [place] Follow up.   Specialty: Home Health Services Why: [facility] will call you to set up the first visit for home health. Contact information: [place] [address] [phone]              Contact information for after-discharge care     Destination     [facility] .   Service: Skilled Nursing Contact information: [place] [phone]                     Dental Antibiotics:  In most cases prophylactic antibiotics for Dental procdeures after total joint surgery are not necessary.  Exceptions are as follows:  1. History of prior total joint infection  2. Severely immunocompromised (Organ Transplant, cancer chemotherapy, Rheumatoid biologic meds such as [place]  3. Poorly controlled diabetes (A1C &gt; 8.0, blood glucose over 200)  If you have one of these conditions, contact your surgeon for an antibiotic prescription, prior to your dental procedure.  Discharge Instructions     Call MD / Call 911   Complete by: As directed    If you experience chest pain or shortness of breath, CALL 911 and be transported to the hospital emergency room.  If you develope a fever above 101 F, pus (white drainage) or increased drainage or redness at the wound, or calf pain, call your surgeon's office.   Call MD / Call 911    Complete by: As directed    If you experience chest pain or shortness of breath, CALL 911 and be transported to the hospital emergency room.  If you develope a fever above 101 F, pus (white drainage) or increased drainage or redness at the wound, or calf pain, call your surgeon's office.   Constipation Prevention   Complete by: As directed    Drink plenty of fluids.  Prune juice may be helpful.  You may use a stool softener, such as Colace (over the counter) 100 mg twice a day.  Use MiraLax (over the counter) for constipation as needed.   Constipation Prevention   Complete by: As directed    Drink plenty of fluids.  Prune juice may be helpful.  You may use a stool softener, such as Colace (over the counter) 100 mg twice a day.  Use MiraLax (over the counter) for constipation as needed.   Diet - low sodium heart healthy   Complete by: As directed    Diet - low sodium heart healthy   Complete by: As directed    Increase activity slowly as tolerated   Complete by: As directed    Increase activity slowly as tolerated   Complete by: As directed    Post-operative opioid taper instructions:   Complete by: As directed    POST-OPERATIVE OPIOID TAPER INSTRUCTIONS: It is important to wean off of your opioid medication as soon as possible. If you do not need pain medication after your surgery it is ok to stop day one. Opioids include: Codeine, Hydrocodone(Norco, Vicodin), Oxycodone(Percocet, oxycontin) and hydromorphone amongst others.  Long term and even short term use of opiods can cause: Increased pain response Dependence Constipation Depression Respiratory depression And more.  Withdrawal symptoms can include Flu like symptoms Nausea, vomiting And more Techniques to manage these symptoms Hydrate well Eat regular healthy meals Stay active Use relaxation techniques(deep breathing, meditating, yoga) Do Not substitute Alcohol to help with tapering If you have been on opioids for less than  two weeks and do not have pain than it is ok to stop all together.  Plan to wean off of opioids This plan should start within one week post op of your joint replacement. Maintain the same interval or time between taking each dose and first decrease the dose.  Cut the total daily intake of opioids by one tablet each day Next start to increase the time between doses. The last dose that should be eliminated is the evening dose.      Post-operative opioid taper instructions:   Complete by: As directed    POST-OPERATIVE OPIOID TAPER INSTRUCTIONS: It is important to wean off of your opioid medication as soon as possible. If you do not need pain medication after your surgery it is ok to stop day one. Opioids include: Codeine, Hydrocodone(Norco, Vicodin), Oxycodone(Percocet, oxycontin) and hydromorphone amongst others.  Long term and even short term use of opiods can cause: Increased pain response Dependence Constipation Depression Respiratory depression And more.  Withdrawal symptoms can include Flu like symptoms Nausea, vomiting And more Techniques to manage these symptoms Hydrate well Eat regular healthy meals Stay active Use relaxation techniques(deep breathing, meditating, yoga) Do Not substitute Alcohol to help with tapering If you have been on opioids for less than two weeks and do not have pain than it is ok to stop all together.  Plan to wean off of opioids This plan should start within one week post op of your joint replacement. Maintain the same interval or time between taking each dose and first decrease the dose.  Cut the total daily intake of opioids by one tablet each day Next start to increase the time between doses. The last dose that should be eliminated is the evening dose.          Allergies as of 10/28/2021       Reactions   Talwin [pentazocine] Nausea Only, Other (See Comments)   Confusion   Other    Blood Product Refusal    Codeine Nausea And  Vomiting, Other (See Comments)   disorientation   Erythromycin Hives   Morphine And Related Rash   Blisters, pt states she can take this med in small doses    Percocet [oxycodone-acetaminophen] Itching        Medication List     TAKE these medications    acetaminophen 650 MG CR tablet Commonly known as: TYLENOL Take 650 mg by mouth every 8 (eight) hours as needed for pain.   albuterol 108 (90 Base) MCG/ACT inhaler Commonly known as: VENTOLIN HFA Inhale 2 puffs into the lungs every 6 (six) hours as needed for wheezing or shortness of breath.   atorvastatin 40 MG tablet Commonly known as: LIPITOR Take 40 mg by mouth at bedtime.   buPROPion 150 MG 24 hr tablet Commonly known as: WELLBUTRIN XL Take 150 mg by mouth daily.   cetirizine 10 MG tablet Commonly known as: ZYRTEC Take 10 mg by mouth daily.   citalopram 40 MG tablet Commonly known as: CELEXA Take 40 mg by mouth daily.   clonazePAM 1 MG tablet Commonly known as: KLONOPIN Take 1 mg by mouth 2 (two) times daily.   CO Q-10 PO Take 1 capsule by mouth daily.   cyanocobalamin 1000 MCG tablet Commonly known as: VITAMIN B12 Take 1,000 mcg by mouth daily.   donepezil 5 MG tablet Commonly known as: ARICEPT Take 5 mg by mouth at bedtime.   furosemide 20 MG tablet Commonly known as: LASIX Take 20 mg by mouth daily.   gabapentin 300 MG capsule Commonly known as: NEURONTIN Take 300 mg by mouth in the morning and at bedtime.   HAIR/SKIN/NAILS PO Take 1 tablet by mouth daily.   HYDROcodone-acetaminophen 5-325 MG tablet Commonly known as: Norco Take 1 tablet by mouth every 6 (six) hours as needed for moderate pain or severe pain.   Melatonin 10 MG Tabs Take 10 mg by mouth at bedtime.   methocarbamol 500 MG tablet Commonly known as: Robaxin Take 1 tablet (500 mg total) by mouth every 8 (eight) hours as needed for muscle spasms. What changed:  when to take this reasons to take this   nitroGLYCERIN 0.4 MG  SL tablet Commonly known as: NITROSTAT Place 0.4 mg under the tongue every 5 (five) minutes as needed for chest pain.   OIL BASE EX Apply 1 application topically  4 (four) times daily as needed (for pain.). CBD OIL   olmesartan 40 MG tablet Commonly known as: BENICAR Take 40 mg by mouth daily.   omeprazole 40 MG capsule Commonly known as: PRILOSEC Take 40 mg by mouth daily.   ondansetron 4 MG tablet Commonly known as: Zofran Take 1 tablet (4 mg total) by mouth every 8 (eight) hours as needed for nausea, vomiting or refractory nausea / vomiting.   pioglitazone 15 MG tablet Commonly known as: ACTOS Take 15 mg by mouth daily.   Trulicity 3 HM/0.9OB Sopn Generic drug: Dulaglutide Inject 3 mg into the skin every Wednesday.   Vitamin D3 250 MCG (10000 UT) Tabs Take 1,000 Units by mouth daily.   zinc gluconate 50 MG tablet Take 50 mg by mouth daily.          Signed: Augustin Schooling 10/28/2021, 6:17 AM  [facility] is now [employer] [facility] [address] [place], [place], [place] [address] Phone: [place]

## 2021-10-28 NOTE — Progress Notes (Signed)
Report given to [facility] at [place] at [facility]. All questions were answered. Pt and her family aware of transfer. CM called [facility]. Pt not in distress, VSS.

## 2021-10-31 ENCOUNTER — Encounter (HOSPITAL_COMMUNITY): Payer: Self-pay | Admitting: Orthopedic Surgery

## 2021-10-31 NOTE — Anesthesia Postprocedure Evaluation (Signed)
Anesthesia Post Note  Patient: Whitney [name]  Procedure(s) Performed: TOTAL KNEE ARTHROPLASTY (Left: Knee)     Patient location during evaluation: PACU Anesthesia Type: Regional, MAC and Spinal Level of consciousness: awake and alert Pain management: pain level controlled Vital Signs Assessment: post-procedure vital signs reviewed and stable Respiratory status: spontaneous breathing, nonlabored ventilation and respiratory function stable Cardiovascular status: stable and blood pressure returned to baseline Postop Assessment: no apparent nausea or vomiting Anesthetic complications: no   No notable events documented.  Last Vitals:  Vitals:   10/28/21 0426 10/28/21 1319  BP: (!) 158/62 (!) 145/60  Pulse: 69 71  Resp: 18 18  Temp: 36.5 C 37 C  SpO2: 94% 94%    Last Pain:  Vitals:   10/28/21 1406  TempSrc:   PainSc: 2                  [name]

## 2021-11-01 DIAGNOSIS — Z96652 Presence of left artificial knee joint: Secondary | ICD-10-CM | POA: Diagnosis not present

## 2021-11-01 DIAGNOSIS — R262 Difficulty in walking, not elsewhere classified: Secondary | ICD-10-CM | POA: Diagnosis not present

## 2021-11-01 DIAGNOSIS — D649 Anemia, unspecified: Secondary | ICD-10-CM | POA: Diagnosis not present

## 2021-11-01 DIAGNOSIS — G8918 Other acute postprocedural pain: Secondary | ICD-10-CM | POA: Diagnosis not present

## 2021-11-03 DIAGNOSIS — Z4789 Encounter for other orthopedic aftercare: Secondary | ICD-10-CM | POA: Diagnosis not present

## 2021-11-14 DIAGNOSIS — M6281 Muscle weakness (generalized): Secondary | ICD-10-CM | POA: Diagnosis not present

## 2021-11-14 DIAGNOSIS — Z96652 Presence of left artificial knee joint: Secondary | ICD-10-CM | POA: Diagnosis not present

## 2021-11-14 DIAGNOSIS — M1712 Unilateral primary osteoarthritis, left knee: Secondary | ICD-10-CM | POA: Diagnosis not present

## 2021-11-14 DIAGNOSIS — M25562 Pain in left knee: Secondary | ICD-10-CM | POA: Diagnosis not present

## 2021-11-16 DIAGNOSIS — Z6833 Body mass index (BMI) 33.0-33.9, adult: Secondary | ICD-10-CM | POA: Diagnosis not present

## 2021-11-16 DIAGNOSIS — M6281 Muscle weakness (generalized): Secondary | ICD-10-CM | POA: Diagnosis not present

## 2021-11-16 DIAGNOSIS — Z96652 Presence of left artificial knee joint: Secondary | ICD-10-CM | POA: Diagnosis not present

## 2021-11-16 DIAGNOSIS — M25562 Pain in left knee: Secondary | ICD-10-CM | POA: Diagnosis not present

## 2021-11-16 DIAGNOSIS — R2689 Other abnormalities of gait and mobility: Secondary | ICD-10-CM | POA: Diagnosis not present

## 2021-11-18 DIAGNOSIS — M6281 Muscle weakness (generalized): Secondary | ICD-10-CM | POA: Diagnosis not present

## 2021-11-18 DIAGNOSIS — R2689 Other abnormalities of gait and mobility: Secondary | ICD-10-CM | POA: Diagnosis not present

## 2021-11-18 DIAGNOSIS — Z96652 Presence of left artificial knee joint: Secondary | ICD-10-CM | POA: Diagnosis not present

## 2021-11-18 DIAGNOSIS — M25562 Pain in left knee: Secondary | ICD-10-CM | POA: Diagnosis not present

## 2021-11-21 DIAGNOSIS — Z96652 Presence of left artificial knee joint: Secondary | ICD-10-CM | POA: Diagnosis not present

## 2021-11-21 DIAGNOSIS — R2689 Other abnormalities of gait and mobility: Secondary | ICD-10-CM | POA: Diagnosis not present

## 2021-11-21 DIAGNOSIS — M6281 Muscle weakness (generalized): Secondary | ICD-10-CM | POA: Diagnosis not present

## 2021-11-21 DIAGNOSIS — M25562 Pain in left knee: Secondary | ICD-10-CM | POA: Diagnosis not present

## 2021-11-23 DIAGNOSIS — R2689 Other abnormalities of gait and mobility: Secondary | ICD-10-CM | POA: Diagnosis not present

## 2021-11-23 DIAGNOSIS — Z96652 Presence of left artificial knee joint: Secondary | ICD-10-CM | POA: Diagnosis not present

## 2021-11-23 DIAGNOSIS — M6281 Muscle weakness (generalized): Secondary | ICD-10-CM | POA: Diagnosis not present

## 2021-11-23 DIAGNOSIS — M25562 Pain in left knee: Secondary | ICD-10-CM | POA: Diagnosis not present

## 2021-11-25 DIAGNOSIS — M6281 Muscle weakness (generalized): Secondary | ICD-10-CM | POA: Diagnosis not present

## 2021-11-25 DIAGNOSIS — M25562 Pain in left knee: Secondary | ICD-10-CM | POA: Diagnosis not present

## 2021-11-25 DIAGNOSIS — Z96652 Presence of left artificial knee joint: Secondary | ICD-10-CM | POA: Diagnosis not present

## 2021-11-25 DIAGNOSIS — R2689 Other abnormalities of gait and mobility: Secondary | ICD-10-CM | POA: Diagnosis not present

## 2021-11-28 DIAGNOSIS — M6281 Muscle weakness (generalized): Secondary | ICD-10-CM | POA: Diagnosis not present

## 2021-11-28 DIAGNOSIS — R2689 Other abnormalities of gait and mobility: Secondary | ICD-10-CM | POA: Diagnosis not present

## 2021-11-28 DIAGNOSIS — Z96652 Presence of left artificial knee joint: Secondary | ICD-10-CM | POA: Diagnosis not present

## 2021-11-28 DIAGNOSIS — M25562 Pain in left knee: Secondary | ICD-10-CM | POA: Diagnosis not present

## 2021-11-30 DIAGNOSIS — M6281 Muscle weakness (generalized): Secondary | ICD-10-CM | POA: Diagnosis not present

## 2021-11-30 DIAGNOSIS — M25562 Pain in left knee: Secondary | ICD-10-CM | POA: Diagnosis not present

## 2021-11-30 DIAGNOSIS — Z96652 Presence of left artificial knee joint: Secondary | ICD-10-CM | POA: Diagnosis not present

## 2021-11-30 DIAGNOSIS — R2689 Other abnormalities of gait and mobility: Secondary | ICD-10-CM | POA: Diagnosis not present

## 2021-12-02 DIAGNOSIS — R2689 Other abnormalities of gait and mobility: Secondary | ICD-10-CM | POA: Diagnosis not present

## 2021-12-02 DIAGNOSIS — M25562 Pain in left knee: Secondary | ICD-10-CM | POA: Diagnosis not present

## 2021-12-02 DIAGNOSIS — M6281 Muscle weakness (generalized): Secondary | ICD-10-CM | POA: Diagnosis not present

## 2021-12-02 DIAGNOSIS — Z96652 Presence of left artificial knee joint: Secondary | ICD-10-CM | POA: Diagnosis not present

## 2021-12-08 DIAGNOSIS — Z96652 Presence of left artificial knee joint: Secondary | ICD-10-CM | POA: Diagnosis not present

## 2021-12-08 DIAGNOSIS — M6281 Muscle weakness (generalized): Secondary | ICD-10-CM | POA: Diagnosis not present

## 2021-12-08 DIAGNOSIS — M25562 Pain in left knee: Secondary | ICD-10-CM | POA: Diagnosis not present

## 2021-12-08 DIAGNOSIS — R2689 Other abnormalities of gait and mobility: Secondary | ICD-10-CM | POA: Diagnosis not present

## 2021-12-13 DIAGNOSIS — M25562 Pain in left knee: Secondary | ICD-10-CM | POA: Diagnosis not present

## 2021-12-13 DIAGNOSIS — Z96652 Presence of left artificial knee joint: Secondary | ICD-10-CM | POA: Diagnosis not present

## 2021-12-13 DIAGNOSIS — M6281 Muscle weakness (generalized): Secondary | ICD-10-CM | POA: Diagnosis not present

## 2021-12-13 DIAGNOSIS — R2689 Other abnormalities of gait and mobility: Secondary | ICD-10-CM | POA: Diagnosis not present

## 2021-12-20 DIAGNOSIS — M6281 Muscle weakness (generalized): Secondary | ICD-10-CM | POA: Diagnosis not present

## 2021-12-20 DIAGNOSIS — R2689 Other abnormalities of gait and mobility: Secondary | ICD-10-CM | POA: Diagnosis not present

## 2021-12-20 DIAGNOSIS — Z96652 Presence of left artificial knee joint: Secondary | ICD-10-CM | POA: Diagnosis not present

## 2021-12-20 DIAGNOSIS — M25562 Pain in left knee: Secondary | ICD-10-CM | POA: Diagnosis not present

## 2021-12-20 DIAGNOSIS — N1832 Chronic kidney disease, stage 3b: Secondary | ICD-10-CM | POA: Diagnosis not present

## 2021-12-22 DIAGNOSIS — I10 Essential (primary) hypertension: Secondary | ICD-10-CM | POA: Diagnosis not present

## 2021-12-22 DIAGNOSIS — E1169 Type 2 diabetes mellitus with other specified complication: Secondary | ICD-10-CM | POA: Diagnosis not present

## 2021-12-22 DIAGNOSIS — E7800 Pure hypercholesterolemia, unspecified: Secondary | ICD-10-CM | POA: Diagnosis not present

## 2021-12-23 DIAGNOSIS — Z96652 Presence of left artificial knee joint: Secondary | ICD-10-CM | POA: Diagnosis not present

## 2021-12-23 DIAGNOSIS — M6281 Muscle weakness (generalized): Secondary | ICD-10-CM | POA: Diagnosis not present

## 2021-12-23 DIAGNOSIS — R2689 Other abnormalities of gait and mobility: Secondary | ICD-10-CM | POA: Diagnosis not present

## 2021-12-23 DIAGNOSIS — M25562 Pain in left knee: Secondary | ICD-10-CM | POA: Diagnosis not present

## 2021-12-27 DIAGNOSIS — M6281 Muscle weakness (generalized): Secondary | ICD-10-CM | POA: Diagnosis not present

## 2021-12-27 DIAGNOSIS — R2689 Other abnormalities of gait and mobility: Secondary | ICD-10-CM | POA: Diagnosis not present

## 2021-12-27 DIAGNOSIS — M25562 Pain in left knee: Secondary | ICD-10-CM | POA: Diagnosis not present

## 2021-12-27 DIAGNOSIS — Z96652 Presence of left artificial knee joint: Secondary | ICD-10-CM | POA: Diagnosis not present

## 2021-12-30 DIAGNOSIS — R2689 Other abnormalities of gait and mobility: Secondary | ICD-10-CM | POA: Diagnosis not present

## 2021-12-30 DIAGNOSIS — Z96652 Presence of left artificial knee joint: Secondary | ICD-10-CM | POA: Diagnosis not present

## 2021-12-30 DIAGNOSIS — M6281 Muscle weakness (generalized): Secondary | ICD-10-CM | POA: Diagnosis not present

## 2021-12-30 DIAGNOSIS — M25562 Pain in left knee: Secondary | ICD-10-CM | POA: Diagnosis not present

## 2022-01-03 DIAGNOSIS — R2689 Other abnormalities of gait and mobility: Secondary | ICD-10-CM | POA: Diagnosis not present

## 2022-01-03 DIAGNOSIS — Z96652 Presence of left artificial knee joint: Secondary | ICD-10-CM | POA: Diagnosis not present

## 2022-01-03 DIAGNOSIS — M25562 Pain in left knee: Secondary | ICD-10-CM | POA: Diagnosis not present

## 2022-01-03 DIAGNOSIS — M6281 Muscle weakness (generalized): Secondary | ICD-10-CM | POA: Diagnosis not present

## 2022-01-06 DIAGNOSIS — M6281 Muscle weakness (generalized): Secondary | ICD-10-CM | POA: Diagnosis not present

## 2022-01-06 DIAGNOSIS — R2689 Other abnormalities of gait and mobility: Secondary | ICD-10-CM | POA: Diagnosis not present

## 2022-01-06 DIAGNOSIS — Z96652 Presence of left artificial knee joint: Secondary | ICD-10-CM | POA: Diagnosis not present

## 2022-01-06 DIAGNOSIS — M25562 Pain in left knee: Secondary | ICD-10-CM | POA: Diagnosis not present

## 2022-01-09 DIAGNOSIS — M6281 Muscle weakness (generalized): Secondary | ICD-10-CM | POA: Diagnosis not present

## 2022-01-09 DIAGNOSIS — Z96652 Presence of left artificial knee joint: Secondary | ICD-10-CM | POA: Diagnosis not present

## 2022-01-09 DIAGNOSIS — R2689 Other abnormalities of gait and mobility: Secondary | ICD-10-CM | POA: Diagnosis not present

## 2022-01-09 DIAGNOSIS — M25562 Pain in left knee: Secondary | ICD-10-CM | POA: Diagnosis not present

## 2022-01-30 DIAGNOSIS — E1169 Type 2 diabetes mellitus with other specified complication: Secondary | ICD-10-CM | POA: Diagnosis not present

## 2022-02-22 DIAGNOSIS — E1169 Type 2 diabetes mellitus with other specified complication: Secondary | ICD-10-CM | POA: Diagnosis not present

## 2022-02-22 DIAGNOSIS — E7800 Pure hypercholesterolemia, unspecified: Secondary | ICD-10-CM | POA: Diagnosis not present

## 2022-02-22 DIAGNOSIS — I10 Essential (primary) hypertension: Secondary | ICD-10-CM | POA: Diagnosis not present

## 2022-03-23 DIAGNOSIS — E7800 Pure hypercholesterolemia, unspecified: Secondary | ICD-10-CM | POA: Diagnosis not present

## 2022-03-23 DIAGNOSIS — E1169 Type 2 diabetes mellitus with other specified complication: Secondary | ICD-10-CM | POA: Diagnosis not present

## 2022-03-23 DIAGNOSIS — I10 Essential (primary) hypertension: Secondary | ICD-10-CM | POA: Diagnosis not present

## 2022-03-23 DIAGNOSIS — N1832 Chronic kidney disease, stage 3b: Secondary | ICD-10-CM | POA: Diagnosis not present

## 2022-03-28 DIAGNOSIS — D631 Anemia in chronic kidney disease: Secondary | ICD-10-CM | POA: Diagnosis not present

## 2022-03-28 DIAGNOSIS — Z7985 Long-term (current) use of injectable non-insulin antidiabetic drugs: Secondary | ICD-10-CM | POA: Diagnosis not present

## 2022-03-28 DIAGNOSIS — N1832 Chronic kidney disease, stage 3b: Secondary | ICD-10-CM | POA: Diagnosis not present

## 2022-03-28 DIAGNOSIS — E559 Vitamin D deficiency, unspecified: Secondary | ICD-10-CM | POA: Diagnosis not present

## 2022-03-28 DIAGNOSIS — I129 Hypertensive chronic kidney disease with stage 1 through stage 4 chronic kidney disease, or unspecified chronic kidney disease: Secondary | ICD-10-CM | POA: Diagnosis not present

## 2022-03-28 DIAGNOSIS — E1122 Type 2 diabetes mellitus with diabetic chronic kidney disease: Secondary | ICD-10-CM | POA: Diagnosis not present

## 2022-05-09 DIAGNOSIS — Z96652 Presence of left artificial knee joint: Secondary | ICD-10-CM | POA: Diagnosis not present

## 2022-05-09 DIAGNOSIS — M25561 Pain in right knee: Secondary | ICD-10-CM | POA: Diagnosis not present

## 2022-06-06 DIAGNOSIS — M13861 Other specified arthritis, right knee: Secondary | ICD-10-CM | POA: Diagnosis not present

## 2022-06-23 DIAGNOSIS — E7800 Pure hypercholesterolemia, unspecified: Secondary | ICD-10-CM | POA: Diagnosis not present

## 2022-06-23 DIAGNOSIS — E1169 Type 2 diabetes mellitus with other specified complication: Secondary | ICD-10-CM | POA: Diagnosis not present

## 2022-07-17 DIAGNOSIS — E119 Type 2 diabetes mellitus without complications: Secondary | ICD-10-CM | POA: Diagnosis not present

## 2022-07-17 DIAGNOSIS — H524 Presbyopia: Secondary | ICD-10-CM | POA: Diagnosis not present

## 2022-08-09 DIAGNOSIS — M25561 Pain in right knee: Secondary | ICD-10-CM | POA: Diagnosis not present

## 2022-08-09 DIAGNOSIS — Z6836 Body mass index (BMI) 36.0-36.9, adult: Secondary | ICD-10-CM | POA: Diagnosis not present

## 2022-08-31 DIAGNOSIS — E1169 Type 2 diabetes mellitus with other specified complication: Secondary | ICD-10-CM | POA: Diagnosis not present

## 2022-08-31 DIAGNOSIS — E7800 Pure hypercholesterolemia, unspecified: Secondary | ICD-10-CM | POA: Diagnosis not present

## 2022-09-05 DIAGNOSIS — Z1331 Encounter for screening for depression: Secondary | ICD-10-CM | POA: Diagnosis not present

## 2022-09-05 DIAGNOSIS — R2689 Other abnormalities of gait and mobility: Secondary | ICD-10-CM | POA: Diagnosis not present

## 2022-09-05 DIAGNOSIS — Z1339 Encounter for screening examination for other mental health and behavioral disorders: Secondary | ICD-10-CM | POA: Diagnosis not present

## 2022-09-05 DIAGNOSIS — Z0000 Encounter for general adult medical examination without abnormal findings: Secondary | ICD-10-CM | POA: Diagnosis not present

## 2022-09-05 DIAGNOSIS — E7800 Pure hypercholesterolemia, unspecified: Secondary | ICD-10-CM | POA: Diagnosis not present

## 2022-09-05 DIAGNOSIS — N1832 Chronic kidney disease, stage 3b: Secondary | ICD-10-CM | POA: Diagnosis not present

## 2022-09-05 DIAGNOSIS — E1169 Type 2 diabetes mellitus with other specified complication: Secondary | ICD-10-CM | POA: Diagnosis not present

## 2022-09-05 DIAGNOSIS — Z6834 Body mass index (BMI) 34.0-34.9, adult: Secondary | ICD-10-CM | POA: Diagnosis not present

## 2022-09-07 NOTE — H&P (Signed)
Patient's anticipated LOS is less than 2 midnights, meeting these requirements: - Younger than 52 - Lives within 1 hour of care - Has a competent adult at home to recover with post-op recover - NO history of  - Chronic pain requiring opiods  - Diabetes  - Coronary Artery Disease  - Heart failure  - Heart attack  - Stroke  - DVT/VTE  - Cardiac arrhythmia  - Respiratory Failure/COPD  - Renal failure  - Anemia  - Advanced Liver disease     Whitney [name] is an 77 y.o. female.    Chief Complaint: right knee pain  HPI: Pt is a 77 y.o. female complaining of right knee pain for multiple years. Pain had continually increased since the beginning. X-rays in the clinic show end-stage arthritic changes of the right knee. Pt has tried various conservative treatments which have failed to alleviate their symptoms, including injections and therapy. Various options are discussed with the patient. Risks, benefits and expectations were discussed with the patient. Patient understand the risks, benefits and expectations and wishes to proceed with surgery.   PCP:  Marylen Ponto, MD  D/C Plans: Home  PMH: Past Medical History:  Diagnosis Date   Anxiety    Arthritis    Asthma    Cancer ([facility])    skin cancer- face-    Chronic kidney disease    CKD   Complication of anesthesia    Concussion    Depression    Diabetes mellitus without complication ([facility])    GERD (gastroesophageal reflux disease)    Hypertension    IBS (irritable bowel syndrome)    diarrhea   MRSA (methicillin resistant Staphylococcus aureus)    after skin cancer removed from face.   Neuropathy    Pneumonia    PONV (postoperative nausea and vomiting)    years ago   PTSD (post-traumatic stress disorder)    Stroke [facility])    "they think a silent one"    PSH: Past Surgical History:  Procedure Laterality Date   ABDOMINAL HYSTERECTOMY     CARPAL TUNNEL RELEASE Bilateral    CATARACT EXTRACTION Bilateral     CHOLECYSTECTOMY     DILATION AND CURETTAGE OF UTERUS     LOWER EXTREMITY VENOGRAPHY N/A 01/10/2017   Procedure: LOWER EXTREMITY ASCENDING VENOGRAPHY;  Surgeon: Maeola Harman, MD;  Location: [facility] INVASIVE CV LAB;  Service: Cardiovascular;  Laterality: N/A;   OVARIAN CYST SURGERY     PERIPHERAL VASCULAR BALLOON ANGIOPLASTY Right 01/10/2017   Procedure: PERIPHERAL VASCULAR BALLOON ANGIOPLASTY;  Surgeon: Maeola Harman, MD;  Location: [facility] INVASIVE CV LAB;  Service: Cardiovascular;  Laterality: Right;   ROTATOR CUFF REPAIR Bilateral    SALPINGOOPHORECTOMY Bilateral    SHOULDER ARTHROSCOPY WITH SUBACROMIAL DECOMPRESSION AND OPEN ROTATOR C Right 01/31/2013   Procedure: RIGHT SHOULDER ARTHROSCOPY WITH SUBACROMIAL DECOMPRESSION AND MINI OPEN ROTATOR CUFF REPAIR, OPEN BICEP TENDODESIS AND OPEN DISTAL CLAVICLE RESECTION;  Surgeon: Verlee Rossetti, MD;  Location: [facility] OR;  Service: Orthopedics;  Laterality: Right;   SHOULDER ARTHROSCOPY WITH SUBACROMIAL DECOMPRESSION AND OPEN ROTATOR C Left 11/28/2013   Procedure: LEFT SHOULDER ARTHROSCOPY WITH SUBACROMIAL DECOMPRESSION  BICEP TENODESIS MINI OPEN ROTATOR CUFF REPAIR, OPEN DISTAL CLAVICLE RESECTION ;  Surgeon: Verlee Rossetti, MD;  Location: [facility] OR;  Service: Orthopedics;  Laterality: Left;   TOTAL KNEE ARTHROPLASTY Left 10/21/2021   Procedure: TOTAL KNEE ARTHROPLASTY;  Surgeon: Beverely Low, MD;  Location: [facility] ORS;  Service: Orthopedics;  Laterality: Left;   TRIGGER  FINGER RELEASE Right    4    TRIGGER FINGER RELEASE Bilateral    thumbs   TUBAL LIGATION      Social History:  reports that she has never smoked. She has never used smokeless tobacco. She reports that she does not drink alcohol and does not use drugs. BMI: Estimated body mass index is 34.38 kg/m as calculated from the following:   Height as of 10/21/21: 5\' 2"  (1.575 m).   Weight as of 10/21/21: 85.3 kg.  No results found for: "ALBUMIN" Diabetes: Patient does not have a  diagnosis of diabetes. Lab Results  Component Value Date   HGBA1C 6.6 (H) 10/14/2021     Smoking Status:   reports that she has never smoked. She has never used smokeless tobacco.    Allergies:  Allergies  Allergen Reactions   Talwin [Pentazocine] Nausea Only and Other (See Comments)    Confusion   Other     Blood Product Refusal    Codeine Nausea And Vomiting and Other (See Comments)    disorientation   Erythromycin Hives   Morphine And Codeine Rash    Blisters, pt states she can take this med in small doses    Percocet [Oxycodone-Acetaminophen] Itching    Medications: No current facility-administered medications for this encounter.   Current Outpatient Medications  Medication Sig Dispense Refill   acetaminophen (TYLENOL) 650 MG CR tablet Take 650 mg by mouth every 8 (eight) hours as needed for pain.     albuterol (PROVENTIL HFA;VENTOLIN HFA) 108 (90 BASE) MCG/ACT inhaler Inhale 2 puffs into the lungs every 6 (six) hours as needed for wheezing or shortness of breath.      atorvastatin (LIPITOR) 40 MG tablet Take 40 mg by mouth at bedtime.     Biotin w/ Vitamins C & E (HAIR/SKIN/NAILS PO) Take 1 tablet by mouth daily.     buPROPion (WELLBUTRIN XL) 150 MG 24 hr tablet Take 150 mg by mouth daily.     cetirizine (ZYRTEC) 10 MG tablet Take 10 mg by mouth daily.     Cholecalciferol (VITAMIN D3) 10000 units TABS Take 1,000 Units by mouth daily.     citalopram (CELEXA) 40 MG tablet Take 40 mg by mouth daily.     clonazePAM (KLONOPIN) 1 MG tablet Take 1 mg by mouth 2 (two) times daily.     Coenzyme Q10 (CO Q-10 PO) Take 1 capsule by mouth daily.     cyanocobalamin (VITAMIN B12) 1000 MCG tablet Take 1,000 mcg by mouth daily.     donepezil (ARICEPT) 5 MG tablet Take 5 mg by mouth at bedtime.     Dulaglutide (TRULICITY) 3 MG/0.5ML SOPN Inject 3 mg into the skin every Wednesday.     furosemide (LASIX) 20 MG tablet Take 20 mg by mouth daily.     gabapentin (NEURONTIN) 300 MG capsule  Take 300 mg by mouth in the morning and at bedtime.     HYDROcodone-acetaminophen (NORCO) 5-325 MG tablet Take 1 tablet by mouth every 6 (six) hours as needed for moderate pain or severe pain. 30 tablet 0   Melatonin 10 MG TABS Take 10 mg by mouth at bedtime.     methocarbamol (ROBAXIN) 500 MG tablet Take 1 tablet (500 mg total) by mouth every 8 (eight) hours as needed for muscle spasms. 60 tablet 1   nitroGLYCERIN (NITROSTAT) 0.4 MG SL tablet Place 0.4 mg under the tongue every 5 (five) minutes as needed for chest pain.  OIL BASE EX Apply 1 application topically 4 (four) times daily as needed (for pain.). CBD OIL     olmesartan (BENICAR) 40 MG tablet Take 40 mg by mouth daily.     omeprazole (PRILOSEC) 40 MG capsule Take 40 mg by mouth daily.     ondansetron (ZOFRAN) 4 MG tablet Take 1 tablet (4 mg total) by mouth every 8 (eight) hours as needed for nausea, vomiting or refractory nausea / vomiting. 30 tablet 1   pioglitazone (ACTOS) 15 MG tablet Take 15 mg by mouth daily.     zinc gluconate 50 MG tablet Take 50 mg by mouth daily.      No results found for this or any previous visit (from the past 48 hour(s)). No results found.  ROS: Pain with rom of the right lower extremity  Physical Exam: Alert and oriented 77 y.o. female in no acute distress Cranial nerves 2-12 intact Cervical spine: full rom with no tenderness, nv intact distally Chest: active breath sounds bilaterally, no wheeze rhonchi or rales Heart: regular rate and rhythm, no murmur Abd: non tender non distended with active bowel sounds Hip is stable with rom  Right knee painful rom with crepitus Nv intact distally No rashes or edema   Assessment/Plan Assessment: right knee end stage osteoarthritis  Plan:  Patient will undergo a right total knee by Dr. Ranell Patrick at [place] Risks benefits and expectations were discussed with the patient. Patient understand risks, benefits and expectations and wishes to  proceed. Preoperative templating of the joint replacement has been completed, documented, and submitted to the Operating Room personnel in order to optimize intra-operative equipment management.   Alphonsa Overall PA-C, MPAS [facility] is now [employer] [address], [address], [place], [place] [address] Phone: [phone] [email or web address] Facebook  [employer]

## 2022-09-14 DIAGNOSIS — E559 Vitamin D deficiency, unspecified: Secondary | ICD-10-CM | POA: Diagnosis not present

## 2022-09-14 DIAGNOSIS — I10 Essential (primary) hypertension: Secondary | ICD-10-CM | POA: Diagnosis not present

## 2022-09-14 DIAGNOSIS — Z7985 Long-term (current) use of injectable non-insulin antidiabetic drugs: Secondary | ICD-10-CM | POA: Diagnosis not present

## 2022-09-14 DIAGNOSIS — E1165 Type 2 diabetes mellitus with hyperglycemia: Secondary | ICD-10-CM | POA: Diagnosis not present

## 2022-09-22 DIAGNOSIS — N1832 Chronic kidney disease, stage 3b: Secondary | ICD-10-CM | POA: Diagnosis not present

## 2022-09-22 DIAGNOSIS — E1122 Type 2 diabetes mellitus with diabetic chronic kidney disease: Secondary | ICD-10-CM | POA: Diagnosis not present

## 2022-09-22 NOTE — Patient Instructions (Signed)
SURGICAL WAITING ROOM VISITATION  Patients having surgery or a procedure may have no more than 2 support people in the waiting area - these visitors may rotate.    Children under the age of 53 must have an adult with them who is not the patient.  Due to an increase in RSV and influenza rates and associated hospitalizations, children ages 62 and under may not visit patients in [facility] hospitals.  If the patient needs to stay at the hospital during part of their recovery, the visitor guidelines for inpatient rooms apply. Pre-op nurse will coordinate an appropriate time for 1 support person to accompany patient in pre-op.  This support person may not rotate.    Please refer to the [facility] website for the visitor guidelines for Inpatients (after your surgery is over and you are in a regular room).       Your procedure is scheduled on: 10/06/22   Report to [facility] Main Entrance    Report to admitting at 10 AM   Call this number if you have problems the morning of surgery [phone]   Do not eat food :After Midnight.   After Midnight you may have the following liquids until 9:30 AM DAY OF SURGERY  Water Non-Citrus Juices (without pulp, NO RED-Apple, White grape, White cranberry) Black Coffee (NO MILK/CREAM OR CREAMERS, sugar ok)  Clear Tea (NO MILK/CREAM OR CREAMERS, sugar ok) regular and decaf                             Plain Jell-O (NO RED)                                           Fruit ices (not with fruit pulp, NO RED)                                     Popsicles (NO RED)                                                               Sports drinks like Gatorade (NO RED)                The day of surgery:  Drink ONE (1) Pre-Surgery G2 at 9:30 AM the morning of surgery. Drink in one sitting. Do not sip.  This drink was given to you during your hospital  pre-op appointment visit. Nothing else to drink after completing the  Pre-Surgery  G2.     Oral  Hygiene is also important to reduce your risk of infection.                                    Remember - BRUSH YOUR TEETH THE MORNING OF SURGERY WITH YOUR REGULAR TOOTHPASTE  DENTURES WILL BE REMOVED PRIOR TO SURGERY PLEASE DO NOT APPLY "Poly grip" OR ADHESIVES!!!   Stop all vitamins and herbal supplements 7 days before surgery.   Take these medicines the morning of surgery:  Citaprolam, clonazepam, Gabapentin, Omeprazole  DO NOT TAKE ANY ORAL DIABETIC MEDICATIONS DAY OF YOUR SURGERY Do not take Actos the day of surgery. Do not take Trulicity for 7 days prior to surgery. The last day it should be taken is 9/             You may not have any metal on your body including hair pins, jewelry, and body piercing             Do not wear make-up, lotions, powders, perfumes or deodorant  Do not wear nail polish including gel and S&S, artificial/acrylic nails, or any other type of covering on natural nails including finger and toenails. If you have artificial nails, gel coating, etc. that needs to be removed by a nail salon please have this removed prior to surgery or surgery may need to be canceled/ delayed if the surgeon/ anesthesia feels like they are unable to be safely monitored.   Do not shave  48 hours prior to surgery.    Do not bring valuables to the hospital. [facility] IS NOT             RESPONSIBLE   FOR VALUABLES.   Contacts, glasses, dentures or bridgework may not be worn into surgery.   Bring small overnight bag day of surgery.   DO NOT BRING YOUR HOME MEDICATIONS TO THE HOSPITAL. PHARMACY WILL DISPENSE MEDICATIONS LISTED ON YOUR MEDICATION LIST TO YOU DURING YOUR ADMISSION IN THE HOSPITAL!    Patients discharged on the day of surgery will not be allowed to drive home.  Someone NEEDS to stay with you for the first 24 hours after anesthesia.   Special Instructions: Bring a copy of your healthcare power of attorney and living will documents the day of surgery if you haven't  scanned them before.              Please read over the following fact sheets you were given: IF YOU HAVE QUESTIONS ABOUT YOUR PRE-OP INSTRUCTIONS PLEASE CALL [phone] [facility]   If you received a COVID test during your pre-op visit  it is requested that you wear a mask when out in public, stay away from anyone that may not be feeling well and notify your surgeon if you develop symptoms. If you test positive for Covid or have been in contact with anyone that has tested positive in the last 10 days please notify you surgeon.      Pre-operative 5 CHG Bath Instructions   You can play a key role in reducing the risk of infection after surgery. Your skin needs to be as free of germs as possible. You can reduce the number of germs on your skin by washing with CHG (chlorhexidine gluconate) soap before surgery. CHG is an antiseptic soap that kills germs and continues to kill germs even after washing.   DO NOT use if you have an allergy to chlorhexidine/CHG or antibacterial soaps. If your skin becomes reddened or irritated, stop using the CHG and notify one of our RNs at [phone].   Please shower with the CHG soap starting 4 days before surgery using the following schedule:     Please keep in mind the following:  DO NOT shave, including legs and underarms, starting the day of your first shower.   You may shave your face at any point before/day of surgery.  Place clean sheets on your bed the day you start using CHG soap. Use a clean washcloth (not used since being  washed) for each shower. DO NOT sleep with pets once you start using the CHG.   CHG Shower Instructions:  If you choose to wash your hair and private area, wash first with your normal shampoo/soap.  After you use shampoo/soap, rinse your hair and body thoroughly to remove shampoo/soap residue.  Turn the water OFF and apply about 3 tablespoons (45 ml) of CHG soap to a CLEAN washcloth.  Apply CHG soap ONLY FROM YOUR NECK DOWN TO YOUR  TOES (washing for 3-5 minutes)  DO NOT use CHG soap on face, private areas, open wounds, or sores.  Pay special attention to the area where your surgery is being performed.  If you are having back surgery, having someone wash your back for you may be helpful. Wait 2 minutes after CHG soap is applied, then you may rinse off the CHG soap.  Pat dry with a clean towel  Put on clean clothes/pajamas   If you choose to wear lotion, please use ONLY the CHG-compatible lotions on the back of this paper.     Additional instructions for the day of surgery: DO NOT APPLY any lotions, deodorants, cologne, or perfumes.   Put on clean/comfortable clothes.  Brush your teeth.  Ask your nurse before applying any prescription medications to the skin.      CHG Compatible Lotions   Aveeno Moisturizing lotion  Cetaphil Moisturizing Cream  Cetaphil Moisturizing Lotion  Clairol Herbal Essence Moisturizing Lotion, Dry Skin  Clairol Herbal Essence Moisturizing Lotion, Extra Dry Skin  Clairol Herbal Essence Moisturizing Lotion, Normal Skin  Curel Age Defying Therapeutic Moisturizing Lotion with Alpha Hydroxy  Curel Extreme Care Body Lotion  Curel Soothing Hands Moisturizing Hand Lotion  Curel Therapeutic Moisturizing Cream, Fragrance-Free  Curel Therapeutic Moisturizing Lotion, Fragrance-Free  Curel Therapeutic Moisturizing Lotion, Original Formula  Eucerin Daily Replenishing Lotion  Eucerin Dry Skin Therapy Plus Alpha Hydroxy Crme  Eucerin Dry Skin Therapy Plus Alpha Hydroxy Lotion  Eucerin Original Crme  Eucerin Original Lotion  Eucerin Plus Crme Eucerin Plus Lotion  Eucerin TriLipid Replenishing Lotion  Keri Anti-Bacterial Hand Lotion  Keri Deep Conditioning Original Lotion Dry Skin Formula Softly Scented  Keri Deep Conditioning Original Lotion, Fragrance Free Sensitive Skin Formula  Keri Lotion Fast Absorbing Fragrance Free Sensitive Skin Formula  Keri Lotion Fast Absorbing Softly Scented Dry  Skin Formula  Keri Original Lotion  Keri Skin Renewal Lotion Keri Silky Smooth Lotion  Keri Silky Smooth Sensitive Skin Lotion  Nivea Body Creamy Conditioning Oil  Nivea Body Extra Enriched Lotion  Nivea Body Original Lotion  Nivea Body Sheer Moisturizing Lotion Nivea Crme  Nivea Skin Firming Lotion  NutraDerm 30 Skin Lotion  NutraDerm Skin Lotion  NutraDerm Therapeutic Skin Cream  NutraDerm Therapeutic Skin Lotion  ProShield Protective Hand Cream  Incentive Spirometer (Watch this video at home: [email or web address])  An incentive spirometer is a tool that can help keep your lungs clear and active. This tool measures how well you are filling your lungs with each breath. Taking long deep breaths may help reverse or decrease the chance of developing breathing (pulmonary) problems (especially infection) following: A long period of time when you are unable to move or be active. BEFORE THE PROCEDURE  If the spirometer includes an indicator to show your best effort, your nurse or respiratory therapist will set it to a desired goal. If possible, sit up straight or lean slightly forward. Try not to slouch. Hold the incentive spirometer in an upright position. INSTRUCTIONS FOR USE  Sit on  the edge of your bed if possible, or sit up as far as you can in bed or on a chair. Hold the incentive spirometer in an upright position. Breathe out normally. Place the mouthpiece in your mouth and seal your lips tightly around it. Breathe in slowly and as deeply as possible, raising the piston or the ball toward the top of the column. Hold your breath for 3-5 seconds or for as long as possible. Allow the piston or ball to fall to the bottom of the column. Remove the mouthpiece from your mouth and breathe out normally. Rest for a few seconds and repeat Steps 1 through 7 at least 10 times every 1-2 hours when you are awake. Take your time and take a few normal breaths between deep  breaths. The spirometer may include an indicator to show your best effort. Use the indicator as a goal to work toward during each repetition. After each set of 10 deep breaths, practice coughing to be sure your lungs are clear. If you have an incision (the cut made at the time of surgery), support your incision when coughing by placing a pillow or rolled up towels firmly against it. Once you are able to get out of bed, walk around indoors and cough well. You may stop using the incentive spirometer when instructed by your caregiver.  RISKS AND COMPLICATIONS Take your time so you do not get dizzy or light-headed. If you are in pain, you may need to take or ask for pain medication before doing incentive spirometry. It is harder to take a deep breath if you are having pain. AFTER USE Rest and breathe slowly and easily. It can be helpful to keep track of a log of your progress. Your caregiver can provide you with a simple table to help with this. If you are using the spirometer at home, follow these instructions: SEEK MEDICAL CARE IF:  You are having difficultly using the spirometer. You have trouble using the spirometer as often as instructed. Your pain medication is not giving enough relief while using the spirometer. You develop fever of 100.5 F (38.1 C) or higher. SEEK IMMEDIATE MEDICAL CARE IF:  You cough up bloody sputum that had not been present before. You develop fever of 102 F (38.9 C) or greater. You develop worsening pain at or near the incision site. MAKE SURE YOU:  Understand these instructions. Will watch your condition. Will get help right away if you are not doing well or get worse. Document Released: 05/22/2006 Document Revised: 04/03/2011 Document Reviewed: 07/23/2006 [facility] Patient Information 2014 [place], [place].

## 2022-09-22 NOTE — Progress Notes (Addendum)
COVID Vaccine received:  []  No [x]  Yes Date of any COVID positive Test in last 90 days: No PCP - Juleen China MD Cardiologist - Dr. Norman Herrlich  Chest x-ray -  EKG -  10/14/21 Epic Stress Test -  ECHO -  Cardiac Cath -   Bowel Prep - [x]  No  []   Yes ______  Pacemaker / ICD device [x]  No []  Yes   Spinal Cord Stimulator:[x]  No []  Yes       History of Sleep Apnea? [x]  No []  Yes   CPAP used?- [x]  No []  Yes    Does the patient monitor blood sugar?          []  No [x]  Yes  []  N/A  Patient has: []  NO Hx DM   []  Pre-DM                 []  DM1  [x]   DM2 Does patient have a [employer] or Dexacom? []  No [x]  Yes   Fasting Blood Sugar Ranges- 150's Checks Blood Sugar ___2__ times a day  GLP1 agonist / usual dose - Trulicity- Instructed pt. To take last dose before surgery on 09/27/22. She verbalized understanding. GLP1 instructions:  SGLT-2 inhibitors / usual dose - no SGLT-2 instructions:   Blood Thinner / Instructions:no Aspirin Instructions:no  Comments:   Activity level: Patient is able  to climb a flight of stairs without difficulty; [x]  No CP  [x]  No SOB, but would have _knee pain__   Patient can perform ADLs without assistance.   Anesthesia review: DM, CKD, HTN, L fascicular block  Patient denies shortness of breath, fever, cough and chest pain at PAT appointment.  Patient verbalized understanding and agreement to the Pre-Surgical Instructions that were given to them at this PAT appointment. Patient was also educated of the need to review these PAT instructions again prior to his/her surgery.I reviewed the appropriate phone numbers to call if they have any and questions or concerns.

## 2022-09-26 ENCOUNTER — Encounter (HOSPITAL_COMMUNITY): Payer: Self-pay

## 2022-09-26 ENCOUNTER — Encounter (HOSPITAL_COMMUNITY)
Admission: RE | Admit: 2022-09-26 | Discharge: 2022-09-26 | Disposition: A | Discharge status: Home / Self Care | Payer: PPO | Source: Ambulatory Visit | Attending: Orthopedic Surgery | Admitting: Orthopedic Surgery

## 2022-09-26 ENCOUNTER — Other Ambulatory Visit: Payer: Self-pay

## 2022-09-26 VITALS — BP 155/76 | HR 79 | Temp 99.1°F | Resp 16 | Ht 62.0 in | Wt 189.0 lb

## 2022-09-26 DIAGNOSIS — Z01812 Encounter for preprocedural laboratory examination: Secondary | ICD-10-CM | POA: Insufficient documentation

## 2022-09-26 DIAGNOSIS — E119 Type 2 diabetes mellitus without complications: Secondary | ICD-10-CM

## 2022-09-26 DIAGNOSIS — I10 Essential (primary) hypertension: Secondary | ICD-10-CM

## 2022-09-26 DIAGNOSIS — Z01818 Encounter for other preprocedural examination: Secondary | ICD-10-CM

## 2022-09-26 HISTORY — DX: Myoneural disorder, unspecified: G70.9

## 2022-09-26 LAB — CBC
HCT: 42.6 % (ref 36.0–46.0)
Hemoglobin: 13.5 g/dL (ref 12.0–15.0)
MCH: 29.3 pg (ref 26.0–34.0)
MCHC: 31.7 g/dL (ref 30.0–36.0)
MCV: 92.4 fL (ref 80.0–100.0)
Platelets: 206 10*3/uL (ref 150–400)
RBC: 4.61 MIL/uL (ref 3.87–5.11)
RDW: 13.1 % (ref 11.5–15.5)
WBC: 6.4 10*3/uL (ref 4.0–10.5)
nRBC: 0 % (ref 0.0–0.2)

## 2022-09-26 LAB — BASIC METABOLIC PANEL
Anion gap: 11 (ref 5–15)
BUN: 15 mg/dL (ref 8–23)
CO2: 23 mmol/L (ref 22–32)
Calcium: 9.2 mg/dL (ref 8.9–10.3)
Chloride: 103 mmol/L (ref 98–111)
Creatinine, Ser: 1.22 mg/dL — ABNORMAL HIGH (ref 0.44–1.00)
GFR, Estimated: 46 mL/min — ABNORMAL LOW (ref >60)
Glucose, Bld: 180 mg/dL — ABNORMAL HIGH (ref 70–99)
Potassium: 4.2 mmol/L (ref 3.5–5.1)
Sodium: 137 mmol/L (ref 135–145)

## 2022-09-26 LAB — HEMOGLOBIN A1C
Hgb A1c MFr Bld: 7.1 % — ABNORMAL HIGH (ref 4.8–5.6)
Mean Plasma Glucose: 157.07 mg/dL

## 2022-09-26 LAB — SURGICAL PCR SCREEN
MRSA, PCR: NEGATIVE
Staphylococcus aureus: NEGATIVE

## 2022-09-26 LAB — NO BLOOD PRODUCTS

## 2022-09-27 NOTE — Progress Notes (Signed)
Pt. Signed blood  products refusal paper.

## 2022-09-28 DIAGNOSIS — E1122 Type 2 diabetes mellitus with diabetic chronic kidney disease: Secondary | ICD-10-CM | POA: Diagnosis not present

## 2022-09-28 DIAGNOSIS — E559 Vitamin D deficiency, unspecified: Secondary | ICD-10-CM | POA: Diagnosis not present

## 2022-09-28 DIAGNOSIS — N1832 Chronic kidney disease, stage 3b: Secondary | ICD-10-CM | POA: Diagnosis not present

## 2022-09-28 DIAGNOSIS — I129 Hypertensive chronic kidney disease with stage 1 through stage 4 chronic kidney disease, or unspecified chronic kidney disease: Secondary | ICD-10-CM | POA: Diagnosis not present

## 2022-09-28 DIAGNOSIS — M898X9 Other specified disorders of bone, unspecified site: Secondary | ICD-10-CM | POA: Diagnosis not present

## 2022-09-28 DIAGNOSIS — D631 Anemia in chronic kidney disease: Secondary | ICD-10-CM | POA: Diagnosis not present

## 2022-10-05 NOTE — Anesthesia Preprocedure Evaluation (Addendum)
Anesthesia Evaluation  Patient identified by MRN, date of birth, ID band Patient awake    Reviewed: Allergy & Precautions, NPO status , Patient's Chart, lab work & pertinent test results  History of Anesthesia Complications (+) PONV and history of anesthetic complications  Airway Mallampati: III  TM Distance: >3 FB Neck ROM: Full    Dental no notable dental hx. (+) Teeth Intact, Dental Advisory Given   Pulmonary asthma    Pulmonary exam normal breath sounds clear to auscultation       Cardiovascular hypertension, Normal cardiovascular exam Rhythm:Regular Rate:Normal     Neuro/Psych  PSYCHIATRIC DISORDERS Anxiety Depression    negative neurological ROS     GI/Hepatic Neg liver ROS,GERD  ,,  Endo/Other  diabetes, Type 2, Oral Hypoglycemic Agents    Renal/GU Renal InsufficiencyRenal diseaseLab Results      Component                Value               Date                      NA                       137                 09/26/2022                CL                       103                 09/26/2022                K                        4.2                 09/26/2022                CO2                      23                  09/26/2022                BUN                      15                  09/26/2022                CREATININE               1.22 (H)            09/26/2022                GFRNONAA                 46 (L)              09/26/2022                CALCIUM                  9.2  09/26/2022                GLUCOSE                  180 (H)             09/26/2022             negative genitourinary   Musculoskeletal  (+) Arthritis ,    Abdominal   Peds  Hematology  (+) REFUSES BLOOD PRODUCTS (no albumin)  Anesthesia Other Findings   Reproductive/Obstetrics                             Anesthesia Physical Anesthesia Plan  ASA: 3  Anesthesia Plan: Spinal and  Regional   Post-op Pain Management: Regional block* and Tylenol PO (pre-op)*   Induction:   PONV Risk Score and Plan: 3 and Treatment may vary due to age or medical condition, Propofol infusion and Ondansetron  Airway Management Planned: Natural Airway  Additional Equipment:   Intra-op Plan:   Post-operative Plan:   Informed Consent: I have reviewed the patients History and Physical, chart, labs and discussed the procedure including the risks, benefits and alternatives for the proposed anesthesia with the patient or authorized representative who has indicated his/her understanding and acceptance.     Dental advisory given  Plan Discussed with: CRNA  Anesthesia Plan Comments:        Anesthesia Quick Evaluation

## 2022-10-05 NOTE — Progress Notes (Signed)
1815 Patient notified about surgery time change for tomorrow, 10-06-22 , arrival 0800, NPO pass midnight and stopping liquids by 0730.  She plans to follow the new instructions.

## 2022-10-06 ENCOUNTER — Encounter (HOSPITAL_COMMUNITY)
Admission: RE | Disposition: A | Discharge status: Skilled Nursing Facility | Payer: Self-pay | Source: Home / Self Care | Attending: Orthopedic Surgery

## 2022-10-06 ENCOUNTER — Inpatient Hospital Stay (HOSPITAL_COMMUNITY): Payer: PPO | Admitting: Physician Assistant

## 2022-10-06 ENCOUNTER — Inpatient Hospital Stay (HOSPITAL_COMMUNITY): Payer: PPO | Admitting: Anesthesiology

## 2022-10-06 ENCOUNTER — Inpatient Hospital Stay (HOSPITAL_COMMUNITY)
Admission: RE | Admit: 2022-10-06 | Discharge: 2022-10-11 | DRG: 470 | Disposition: A | Discharge status: Skilled Nursing Facility | Payer: PPO | Attending: Orthopedic Surgery | Admitting: Orthopedic Surgery

## 2022-10-06 ENCOUNTER — Other Ambulatory Visit: Payer: Self-pay

## 2022-10-06 ENCOUNTER — Encounter (HOSPITAL_COMMUNITY): Payer: Self-pay | Admitting: Orthopedic Surgery

## 2022-10-06 DIAGNOSIS — Z8673 Personal history of transient ischemic attack (TIA), and cerebral infarction without residual deficits: Secondary | ICD-10-CM | POA: Diagnosis not present

## 2022-10-06 DIAGNOSIS — Z7985 Long-term (current) use of injectable non-insulin antidiabetic drugs: Secondary | ICD-10-CM | POA: Diagnosis not present

## 2022-10-06 DIAGNOSIS — G8918 Other acute postprocedural pain: Secondary | ICD-10-CM | POA: Diagnosis not present

## 2022-10-06 DIAGNOSIS — Z751 Person awaiting admission to adequate facility elsewhere: Secondary | ICD-10-CM | POA: Diagnosis not present

## 2022-10-06 DIAGNOSIS — Z7401 Bed confinement status: Secondary | ICD-10-CM | POA: Diagnosis not present

## 2022-10-06 DIAGNOSIS — Z8614 Personal history of Methicillin resistant Staphylococcus aureus infection: Secondary | ICD-10-CM | POA: Diagnosis not present

## 2022-10-06 DIAGNOSIS — Z79899 Other long term (current) drug therapy: Secondary | ICD-10-CM | POA: Diagnosis not present

## 2022-10-06 DIAGNOSIS — F4310 Post-traumatic stress disorder, unspecified: Secondary | ICD-10-CM | POA: Diagnosis not present

## 2022-10-06 DIAGNOSIS — E119 Type 2 diabetes mellitus without complications: Secondary | ICD-10-CM | POA: Diagnosis not present

## 2022-10-06 DIAGNOSIS — E1140 Type 2 diabetes mellitus with diabetic neuropathy, unspecified: Secondary | ICD-10-CM | POA: Diagnosis present

## 2022-10-06 DIAGNOSIS — Z881 Allergy status to other antibiotic agents status: Secondary | ICD-10-CM | POA: Diagnosis not present

## 2022-10-06 DIAGNOSIS — Z9079 Acquired absence of other genital organ(s): Secondary | ICD-10-CM

## 2022-10-06 DIAGNOSIS — K589 Irritable bowel syndrome without diarrhea: Secondary | ICD-10-CM | POA: Diagnosis not present

## 2022-10-06 DIAGNOSIS — M25761 Osteophyte, right knee: Secondary | ICD-10-CM | POA: Diagnosis present

## 2022-10-06 DIAGNOSIS — G5783 Other specified mononeuropathies of bilateral lower limbs: Secondary | ICD-10-CM | POA: Diagnosis not present

## 2022-10-06 DIAGNOSIS — Z96652 Presence of left artificial knee joint: Secondary | ICD-10-CM | POA: Diagnosis not present

## 2022-10-06 DIAGNOSIS — Z471 Aftercare following joint replacement surgery: Secondary | ICD-10-CM | POA: Diagnosis not present

## 2022-10-06 DIAGNOSIS — E1165 Type 2 diabetes mellitus with hyperglycemia: Secondary | ICD-10-CM | POA: Diagnosis not present

## 2022-10-06 DIAGNOSIS — R2681 Unsteadiness on feet: Secondary | ICD-10-CM | POA: Diagnosis not present

## 2022-10-06 DIAGNOSIS — Z90710 Acquired absence of both cervix and uterus: Secondary | ICD-10-CM

## 2022-10-06 DIAGNOSIS — Z90722 Acquired absence of ovaries, bilateral: Secondary | ICD-10-CM

## 2022-10-06 DIAGNOSIS — J45909 Unspecified asthma, uncomplicated: Secondary | ICD-10-CM

## 2022-10-06 DIAGNOSIS — I10 Essential (primary) hypertension: Secondary | ICD-10-CM | POA: Diagnosis not present

## 2022-10-06 DIAGNOSIS — F419 Anxiety disorder, unspecified: Secondary | ICD-10-CM | POA: Diagnosis not present

## 2022-10-06 DIAGNOSIS — Z96651 Presence of right artificial knee joint: Principal | ICD-10-CM

## 2022-10-06 DIAGNOSIS — K219 Gastro-esophageal reflux disease without esophagitis: Secondary | ICD-10-CM | POA: Diagnosis not present

## 2022-10-06 DIAGNOSIS — Z888 Allergy status to other drugs, medicaments and biological substances status: Secondary | ICD-10-CM

## 2022-10-06 DIAGNOSIS — F418 Other specified anxiety disorders: Secondary | ICD-10-CM | POA: Diagnosis not present

## 2022-10-06 DIAGNOSIS — R2689 Other abnormalities of gait and mobility: Secondary | ICD-10-CM | POA: Diagnosis not present

## 2022-10-06 DIAGNOSIS — F329 Major depressive disorder, single episode, unspecified: Secondary | ICD-10-CM | POA: Diagnosis not present

## 2022-10-06 DIAGNOSIS — M6281 Muscle weakness (generalized): Secondary | ICD-10-CM | POA: Diagnosis not present

## 2022-10-06 DIAGNOSIS — M810 Age-related osteoporosis without current pathological fracture: Secondary | ICD-10-CM | POA: Diagnosis not present

## 2022-10-06 DIAGNOSIS — M1711 Unilateral primary osteoarthritis, right knee: Secondary | ICD-10-CM

## 2022-10-06 DIAGNOSIS — F29 Unspecified psychosis not due to a substance or known physiological condition: Secondary | ICD-10-CM | POA: Diagnosis not present

## 2022-10-06 DIAGNOSIS — Z9049 Acquired absence of other specified parts of digestive tract: Secondary | ICD-10-CM

## 2022-10-06 DIAGNOSIS — D8481 Immunodeficiency due to conditions classified elsewhere: Secondary | ICD-10-CM | POA: Diagnosis not present

## 2022-10-06 DIAGNOSIS — Z85828 Personal history of other malignant neoplasm of skin: Secondary | ICD-10-CM

## 2022-10-06 DIAGNOSIS — Z885 Allergy status to narcotic agent status: Secondary | ICD-10-CM

## 2022-10-06 DIAGNOSIS — N183 Chronic kidney disease, stage 3 unspecified: Secondary | ICD-10-CM | POA: Diagnosis not present

## 2022-10-06 HISTORY — PX: TOTAL KNEE ARTHROPLASTY: SHX125

## 2022-10-06 LAB — GLUCOSE, CAPILLARY
Glucose-Capillary: 281 mg/dL — ABNORMAL HIGH (ref 70–99)
Glucose-Capillary: 215 mg/dL — ABNORMAL HIGH (ref 70–99)

## 2022-10-06 SURGERY — ARTHROPLASTY, KNEE, TOTAL
Anesthesia: Regional | Site: Knee | Laterality: Right

## 2022-10-06 MED ORDER — SODIUM CHLORIDE (PF) 0.9 % IJ SOLN
INTRAMUSCULAR | Status: AC
  Filled 2022-10-06: qty 50

## 2022-10-06 MED ORDER — LACTATED RINGERS IV SOLN: INTRAVENOUS | Status: DC

## 2022-10-06 MED ORDER — 0.9 % SODIUM CHLORIDE (POUR BTL) OPTIME
TOPICAL | Status: DC | PRN
  Administered 2022-10-06: 1000 mL

## 2022-10-06 MED ORDER — BUPIVACAINE LIPOSOME 1.3 % IJ SUSP
INTRAMUSCULAR | Status: AC
  Filled 2022-10-06: qty 20

## 2022-10-06 MED ORDER — DULAGLUTIDE 3 MG/0.5ML ~~LOC~~ SOAJ: 3.0000 mg | SUBCUTANEOUS | Status: DC

## 2022-10-06 MED ORDER — FENTANYL CITRATE (PF) 100 MCG/2ML IJ SOLN
INTRAMUSCULAR | Status: AC
  Filled 2022-10-06: qty 2

## 2022-10-06 MED ORDER — ALBUTEROL SULFATE HFA 108 (90 BASE) MCG/ACT IN AERS: 2.0000 | INHALATION_SPRAY | Freq: Four times a day (QID) | RESPIRATORY_TRACT | Status: DC | PRN

## 2022-10-06 MED ORDER — CO Q-10 200 MG PO CAPS: 200.0000 mg | ORAL_CAPSULE | Freq: Every day | ORAL | Status: DC

## 2022-10-06 MED ORDER — DOCUSATE SODIUM 100 MG PO CAPS
100.0000 mg | ORAL_CAPSULE | Freq: Two times a day (BID) | ORAL | Status: DC
  Administered 2022-10-06 – 2022-10-11 (×10): 100 mg via ORAL
  Filled 2022-10-06 (×10): qty 1

## 2022-10-06 MED ORDER — FUROSEMIDE 20 MG PO TABS
20.0000 mg | ORAL_TABLET | Freq: Every day | ORAL | Status: DC
  Administered 2022-10-06 – 2022-10-11 (×6): 20 mg via ORAL
  Filled 2022-10-06 (×6): qty 1

## 2022-10-06 MED ORDER — ORAL CARE MOUTH RINSE: 15.0000 mL | Freq: Once | OROMUCOSAL | Status: AC

## 2022-10-06 MED ORDER — MENTHOL 3 MG MT LOZG: 1.0000 | LOZENGE | OROMUCOSAL | Status: DC | PRN

## 2022-10-06 MED ORDER — ONDANSETRON HCL 4 MG PO TABS: 4.0000 mg | ORAL_TABLET | Freq: Four times a day (QID) | ORAL | Status: DC | PRN

## 2022-10-06 MED ORDER — SODIUM CHLORIDE 0.9 % IV SOLN: INTRAVENOUS | Status: DC

## 2022-10-06 MED ORDER — PROPOFOL 1000 MG/100ML IV EMUL
INTRAVENOUS | Status: AC
  Filled 2022-10-06: qty 100

## 2022-10-06 MED ORDER — ALBUTEROL SULFATE (2.5 MG/3ML) 0.083% IN NEBU: 2.5000 mg | INHALATION_SOLUTION | Freq: Four times a day (QID) | RESPIRATORY_TRACT | Status: DC | PRN

## 2022-10-06 MED ORDER — DEXAMETHASONE SODIUM PHOSPHATE 10 MG/ML IJ SOLN
INTRAMUSCULAR | Status: DC | PRN
  Administered 2022-10-06: 10 mg
  Administered 2022-10-06: 5 mg

## 2022-10-06 MED ORDER — MIDAZOLAM HCL 2 MG/2ML IJ SOLN
INTRAMUSCULAR | Status: AC
  Filled 2022-10-06: qty 2

## 2022-10-06 MED ORDER — ROPIVACAINE HCL 5 MG/ML IJ SOLN
INTRAMUSCULAR | Status: DC | PRN
Start: 2022-10-06 — End: 2022-10-06
  Administered 2022-10-06: 20 mL via PERINEURAL

## 2022-10-06 MED ORDER — INSULIN ASPART 100 UNIT/ML IJ SOLN
0.0000 [IU] | INTRAMUSCULAR | Status: DC | PRN
  Administered 2022-10-06: 4 [IU] via SUBCUTANEOUS

## 2022-10-06 MED ORDER — ACETAMINOPHEN 500 MG PO TABS
1000.0000 mg | ORAL_TABLET | Freq: Once | ORAL | Status: AC
  Administered 2022-10-06: 1000 mg via ORAL
  Filled 2022-10-06: qty 2

## 2022-10-06 MED ORDER — MIDAZOLAM HCL 5 MG/5ML IJ SOLN
INTRAMUSCULAR | Status: DC | PRN
  Administered 2022-10-06: 1 mg via INTRAVENOUS

## 2022-10-06 MED ORDER — TRANEXAMIC ACID-NACL 1000-0.7 MG/100ML-% IV SOLN
1000.0000 mg | INTRAVENOUS | Status: AC
  Administered 2022-10-06: 1000 mg via INTRAVENOUS
  Filled 2022-10-06: qty 100

## 2022-10-06 MED ORDER — METHOCARBAMOL 500 MG IVPB - SIMPLE MED
INTRAVENOUS | Status: AC
  Filled 2022-10-06: qty 55

## 2022-10-06 MED ORDER — ONDANSETRON HCL 4 MG/2ML IJ SOLN
INTRAMUSCULAR | Status: AC
  Filled 2022-10-06: qty 2

## 2022-10-06 MED ORDER — IRBESARTAN 150 MG PO TABS
300.0000 mg | ORAL_TABLET | Freq: Every day | ORAL | Status: DC
  Administered 2022-10-06 – 2022-10-11 (×6): 300 mg via ORAL
  Filled 2022-10-06 (×6): qty 2

## 2022-10-06 MED ORDER — ACETAMINOPHEN 325 MG PO TABS: 325.0000 mg | ORAL_TABLET | Freq: Four times a day (QID) | ORAL | Status: DC | PRN

## 2022-10-06 MED ORDER — BUPIVACAINE LIPOSOME 1.3 % IJ SUSP: 20.0000 mL | Freq: Once | INTRAMUSCULAR | Status: DC

## 2022-10-06 MED ORDER — ONDANSETRON HCL 4 MG PO TABS: 4.0000 mg | ORAL_TABLET | Freq: Three times a day (TID) | ORAL | 1 refills | Status: AC | PRN

## 2022-10-06 MED ORDER — FENTANYL CITRATE (PF) 100 MCG/2ML IJ SOLN
INTRAMUSCULAR | Status: DC | PRN
  Administered 2022-10-06: 50 ug via INTRAVENOUS
  Administered 2022-10-06: 25 ug via INTRAVENOUS

## 2022-10-06 MED ORDER — BUPIVACAINE IN DEXTROSE 0.75-8.25 % IT SOLN
INTRATHECAL | Status: DC | PRN
Start: 2022-10-06 — End: 2022-10-06
  Administered 2022-10-06: 1.6 mL via INTRATHECAL

## 2022-10-06 MED ORDER — TRANEXAMIC ACID-NACL 1000-0.7 MG/100ML-% IV SOLN
1000.0000 mg | Freq: Once | INTRAVENOUS | Status: AC
  Administered 2022-10-06: 1000 mg via INTRAVENOUS
  Filled 2022-10-06: qty 100

## 2022-10-06 MED ORDER — PIOGLITAZONE HCL 15 MG PO TABS
15.0000 mg | ORAL_TABLET | Freq: Every day | ORAL | Status: DC
  Administered 2022-10-06 – 2022-10-11 (×6): 15 mg via ORAL
  Filled 2022-10-06 (×6): qty 1

## 2022-10-06 MED ORDER — METHOCARBAMOL 500 MG PO TABS: 500.0000 mg | ORAL_TABLET | Freq: Three times a day (TID) | ORAL | 1 refills | Status: AC | PRN

## 2022-10-06 MED ORDER — ONDANSETRON HCL 4 MG PO TABS: 4.0000 mg | ORAL_TABLET | Freq: Three times a day (TID) | ORAL | Status: DC | PRN

## 2022-10-06 MED ORDER — SODIUM CHLORIDE 0.9 % IR SOLN
Status: DC | PRN
  Administered 2022-10-06: 1000 mL

## 2022-10-06 MED ORDER — METHOCARBAMOL 500 MG IVPB - SIMPLE MED
500.0000 mg | Freq: Four times a day (QID) | INTRAVENOUS | Status: DC | PRN
  Administered 2022-10-06: 500 mg via INTRAVENOUS

## 2022-10-06 MED ORDER — PANTOPRAZOLE SODIUM 40 MG PO TBEC
40.0000 mg | DELAYED_RELEASE_TABLET | Freq: Every day | ORAL | Status: DC
  Administered 2022-10-06 – 2022-10-11 (×6): 40 mg via ORAL
  Filled 2022-10-06 (×6): qty 1

## 2022-10-06 MED ORDER — VITAMIN D 25 MCG (1000 UNIT) PO TABS
1000.0000 [IU] | ORAL_TABLET | Freq: Every day | ORAL | Status: DC
  Administered 2022-10-06 – 2022-10-11 (×6): 1000 [IU] via ORAL
  Filled 2022-10-06 (×6): qty 1

## 2022-10-06 MED ORDER — PHENYLEPHRINE HCL-NACL 20-0.9 MG/250ML-% IV SOLN
INTRAVENOUS | Status: AC
  Filled 2022-10-06: qty 250

## 2022-10-06 MED ORDER — CEFAZOLIN SODIUM-DEXTROSE 2-4 GM/100ML-% IV SOLN
2.0000 g | INTRAVENOUS | Status: AC
  Administered 2022-10-06: 2 g via INTRAVENOUS
  Filled 2022-10-06: qty 100

## 2022-10-06 MED ORDER — GABAPENTIN 300 MG PO CAPS
300.0000 mg | ORAL_CAPSULE | Freq: Two times a day (BID) | ORAL | Status: DC
  Administered 2022-10-06 – 2022-10-11 (×10): 300 mg via ORAL
  Filled 2022-10-06 (×10): qty 1

## 2022-10-06 MED ORDER — HYDROCODONE-ACETAMINOPHEN 5-325 MG PO TABS
1.0000 | ORAL_TABLET | ORAL | Status: DC | PRN
  Administered 2022-10-06 – 2022-10-08 (×6): 2 via ORAL
  Administered 2022-10-09 – 2022-10-11 (×4): 1 via ORAL
  Filled 2022-10-06: qty 1
  Filled 2022-10-06 (×3): qty 2
  Filled 2022-10-06 (×2): qty 1
  Filled 2022-10-06 (×3): qty 2
  Filled 2022-10-06: qty 1
  Filled 2022-10-06: qty 2

## 2022-10-06 MED ORDER — ASPIRIN 81 MG PO CHEW
81.0000 mg | CHEWABLE_TABLET | Freq: Two times a day (BID) | ORAL | Status: DC
  Administered 2022-10-06 – 2022-10-11 (×10): 81 mg via ORAL
  Filled 2022-10-06 (×10): qty 1

## 2022-10-06 MED ORDER — POLYETHYLENE GLYCOL 3350 17 G PO PACK: 17.0000 g | PACK | Freq: Every day | ORAL | Status: DC | PRN

## 2022-10-06 MED ORDER — CLONAZEPAM 1 MG PO TABS
1.0000 mg | ORAL_TABLET | Freq: Two times a day (BID) | ORAL | Status: DC
  Administered 2022-10-06 – 2022-10-11 (×10): 1 mg via ORAL
  Filled 2022-10-06 (×10): qty 1

## 2022-10-06 MED ORDER — ACETAMINOPHEN 325 MG PO TABS: 650.0000 mg | ORAL_TABLET | Freq: Three times a day (TID) | ORAL | Status: DC | PRN

## 2022-10-06 MED ORDER — POVIDONE-IODINE 10 % EX SWAB: 2.0000 | Freq: Once | CUTANEOUS | Status: DC

## 2022-10-06 MED ORDER — MIDAZOLAM HCL 2 MG/2ML IJ SOLN: 2.0000 mg | INTRAMUSCULAR | Status: DC

## 2022-10-06 MED ORDER — INSULIN ASPART 100 UNIT/ML IJ SOLN
INTRAMUSCULAR | Status: AC
  Filled 2022-10-06: qty 1

## 2022-10-06 MED ORDER — ONDANSETRON HCL 4 MG/2ML IJ SOLN: 4.0000 mg | Freq: Four times a day (QID) | INTRAMUSCULAR | Status: DC | PRN

## 2022-10-06 MED ORDER — CITALOPRAM HYDROBROMIDE 20 MG PO TABS
40.0000 mg | ORAL_TABLET | Freq: Every day | ORAL | Status: DC
  Administered 2022-10-06 – 2022-10-11 (×6): 40 mg via ORAL
  Filled 2022-10-06 (×6): qty 2

## 2022-10-06 MED ORDER — HYDROCODONE-ACETAMINOPHEN 5-325 MG PO TABS: 1.0000 | ORAL_TABLET | ORAL | 0 refills | Status: AC | PRN

## 2022-10-06 MED ORDER — DEXAMETHASONE SODIUM PHOSPHATE 10 MG/ML IJ SOLN
INTRAMUSCULAR | Status: AC
  Filled 2022-10-06: qty 1

## 2022-10-06 MED ORDER — LORATADINE 10 MG PO TABS
10.0000 mg | ORAL_TABLET | Freq: Every day | ORAL | Status: DC
  Administered 2022-10-06 – 2022-10-11 (×6): 10 mg via ORAL
  Filled 2022-10-06 (×6): qty 1

## 2022-10-06 MED ORDER — ASPIRIN 81 MG PO CHEW: 81.0000 mg | CHEWABLE_TABLET | Freq: Two times a day (BID) | ORAL | 0 refills | Status: AC

## 2022-10-06 MED ORDER — METHOCARBAMOL 500 MG PO TABS
500.0000 mg | ORAL_TABLET | Freq: Four times a day (QID) | ORAL | Status: DC | PRN
  Administered 2022-10-06 – 2022-10-07 (×4): 500 mg via ORAL
  Filled 2022-10-06 (×4): qty 1

## 2022-10-06 MED ORDER — PROPOFOL 500 MG/50ML IV EMUL
INTRAVENOUS | Status: DC | PRN
  Administered 2022-10-06: 50 ug/kg/min via INTRAVENOUS

## 2022-10-06 MED ORDER — ACETAMINOPHEN ER 650 MG PO TBCR: 650.0000 mg | EXTENDED_RELEASE_TABLET | Freq: Three times a day (TID) | ORAL | Status: DC | PRN

## 2022-10-06 MED ORDER — MELATONIN 5 MG PO TABS
10.0000 mg | ORAL_TABLET | Freq: Every day | ORAL | Status: DC
  Administered 2022-10-06 – 2022-10-10 (×5): 10 mg via ORAL
  Filled 2022-10-06 (×5): qty 2

## 2022-10-06 MED ORDER — METOCLOPRAMIDE HCL 5 MG/ML IJ SOLN: 5.0000 mg | Freq: Three times a day (TID) | INTRAMUSCULAR | Status: DC | PRN

## 2022-10-06 MED ORDER — LIDOCAINE HCL (PF) 2 % IJ SOLN
INTRAMUSCULAR | Status: AC
  Filled 2022-10-06: qty 5

## 2022-10-06 MED ORDER — PROPOFOL 10 MG/ML IV BOLUS
INTRAVENOUS | Status: DC | PRN
Start: 2022-10-06 — End: 2022-10-06
  Administered 2022-10-06: 10 mg via INTRAVENOUS
  Administered 2022-10-06: 20 mg via INTRAVENOUS

## 2022-10-06 MED ORDER — ATORVASTATIN CALCIUM 40 MG PO TABS
40.0000 mg | ORAL_TABLET | Freq: Every day | ORAL | Status: DC
  Administered 2022-10-06 – 2022-10-10 (×5): 40 mg via ORAL
  Filled 2022-10-06 (×5): qty 1

## 2022-10-06 MED ORDER — ONDANSETRON HCL 4 MG/2ML IJ SOLN
INTRAMUSCULAR | Status: DC | PRN
  Administered 2022-10-06: 4 mg via INTRAVENOUS

## 2022-10-06 MED ORDER — WATER FOR IRRIGATION, STERILE IR SOLN
Status: DC | PRN
  Administered 2022-10-06: 2000 mL

## 2022-10-06 MED ORDER — MORPHINE SULFATE (PF) 2 MG/ML IV SOLN: 0.5000 mg | INTRAVENOUS | Status: DC | PRN

## 2022-10-06 MED ORDER — VITAMIN B-12 1000 MCG PO TABS
1000.0000 ug | ORAL_TABLET | Freq: Every day | ORAL | Status: DC
  Administered 2022-10-06 – 2022-10-11 (×6): 1000 ug via ORAL
  Filled 2022-10-06 (×6): qty 1

## 2022-10-06 MED ORDER — BUPIVACAINE-EPINEPHRINE 0.25% -1:200000 IJ SOLN
INTRAMUSCULAR | Status: AC
  Filled 2022-10-06: qty 1

## 2022-10-06 MED ORDER — METOCLOPRAMIDE HCL 5 MG PO TABS: 5.0000 mg | ORAL_TABLET | Freq: Three times a day (TID) | ORAL | Status: DC | PRN

## 2022-10-06 MED ORDER — LIDOCAINE HCL (CARDIAC) PF 100 MG/5ML IV SOSY
PREFILLED_SYRINGE | INTRAVENOUS | Status: DC | PRN
  Administered 2022-10-06: 40 mg via INTRAVENOUS

## 2022-10-06 MED ORDER — BUPROPION HCL ER (XL) 150 MG PO TB24
150.0000 mg | ORAL_TABLET | Freq: Every day | ORAL | Status: DC
  Administered 2022-10-06 – 2022-10-11 (×6): 150 mg via ORAL
  Filled 2022-10-06 (×6): qty 1

## 2022-10-06 MED ORDER — BUPIVACAINE LIPOSOME 1.3 % IJ SUSP
INTRAMUSCULAR | Status: DC | PRN
  Administered 2022-10-06: 20 mL

## 2022-10-06 MED ORDER — OIL BASE EX LIQD
Freq: Four times a day (QID) | CUTANEOUS | Status: DC | PRN
Start: 2022-10-06 — End: 2022-10-06

## 2022-10-06 MED ORDER — MELATONIN 10 MG PO TABS: 10.0000 mg | ORAL_TABLET | Freq: Every day | ORAL | Status: DC

## 2022-10-06 MED ORDER — BUPIVACAINE-EPINEPHRINE 0.25% -1:200000 IJ SOLN
INTRAMUSCULAR | Status: DC | PRN
  Administered 2022-10-06: 30 mL

## 2022-10-06 MED ORDER — FENTANYL CITRATE PF 50 MCG/ML IJ SOSY
100.0000 ug | PREFILLED_SYRINGE | INTRAMUSCULAR | Status: DC
  Administered 2022-10-06: 50 ug via INTRAVENOUS
  Filled 2022-10-06: qty 2

## 2022-10-06 MED ORDER — DONEPEZIL HCL 10 MG PO TABS
5.0000 mg | ORAL_TABLET | Freq: Every day | ORAL | Status: DC
  Administered 2022-10-06 – 2022-10-10 (×5): 5 mg via ORAL
  Filled 2022-10-06 (×5): qty 1

## 2022-10-06 MED ORDER — CEFAZOLIN SODIUM-DEXTROSE 2-4 GM/100ML-% IV SOLN
2.0000 g | Freq: Four times a day (QID) | INTRAVENOUS | Status: AC
  Administered 2022-10-06 – 2022-10-07 (×2): 2 g via INTRAVENOUS
  Filled 2022-10-06 (×2): qty 100

## 2022-10-06 MED ORDER — PHENOL 1.4 % MT LIQD: 1.0000 | OROMUCOSAL | Status: DC | PRN

## 2022-10-06 MED ORDER — PHENYLEPHRINE HCL-NACL 20-0.9 MG/250ML-% IV SOLN
INTRAVENOUS | Status: DC | PRN
Start: 2022-10-06 — End: 2022-10-06
  Administered 2022-10-06: 30 ug/min via INTRAVENOUS

## 2022-10-06 MED ORDER — CHLORHEXIDINE GLUCONATE 0.12 % MT SOLN
15.0000 mL | Freq: Once | OROMUCOSAL | Status: AC
  Administered 2022-10-06: 15 mL via OROMUCOSAL

## 2022-10-06 MED ORDER — HAIR/SKIN/NAILS PO TABS
ORAL_TABLET | Freq: Every day | ORAL | Status: DC
Start: 2022-10-06 — End: 2022-10-06

## 2022-10-06 MED ORDER — FENTANYL CITRATE PF 50 MCG/ML IJ SOSY
25.0000 ug | PREFILLED_SYRINGE | INTRAMUSCULAR | Status: DC | PRN
  Administered 2022-10-06: 50 ug via INTRAVENOUS

## 2022-10-06 MED ORDER — NITROGLYCERIN 0.4 MG SL SUBL: 0.4000 mg | SUBLINGUAL_TABLET | SUBLINGUAL | Status: DC | PRN

## 2022-10-06 MED ORDER — FENTANYL CITRATE PF 50 MCG/ML IJ SOSY
PREFILLED_SYRINGE | INTRAMUSCULAR | Status: AC
  Filled 2022-10-06: qty 1

## 2022-10-06 MED ORDER — SODIUM CHLORIDE (PF) 0.9 % IJ SOLN
INTRAMUSCULAR | Status: DC | PRN
  Administered 2022-10-06: 30 mL

## 2022-10-06 SURGICAL SUPPLY — 49 items
ATTUNE MED DOME PAT 38 KNEE (Knees) IMPLANT
ATTUNE PSFEM RTSZ6 NARCEM KNEE (Femur) IMPLANT
ATTUNE PSRP INSR SZ6 8 KNEE (Insert) IMPLANT
BASE TIBIAL ROT PLAT SZ 5 KNEE (Knees) IMPLANT
BLADE SAG 18X100X1.27 (BLADE) ×1 IMPLANT
BLADE SAW SGTL 13X75X1.27 (BLADE) ×1 IMPLANT
BNDG CMPR MED 10X6 ELC LF (GAUZE/BANDAGES/DRESSINGS) ×1
BNDG ELASTIC 6X10 VLCR STRL LF (GAUZE/BANDAGES/DRESSINGS) ×1 IMPLANT
BNDG GAUZE DERMACEA FLUFF 4 (GAUZE/BANDAGES/DRESSINGS) ×1 IMPLANT
BNDG GZE DERMACEA 4 6PLY (GAUZE/BANDAGES/DRESSINGS) ×1
BOWL SMART MIX CTS (DISPOSABLE) ×1 IMPLANT
BSPLAT TIB 5 CMNT ROT PLAT STR (Knees) ×1 IMPLANT
CEMENT HV SMART SET (Cement) ×2 IMPLANT
COVER SURGICAL LIGHT HANDLE (MISCELLANEOUS) ×1 IMPLANT
CUFF TOURN SGL QUICK 34 (TOURNIQUET CUFF) ×1
CUFF TRNQT CYL 34X4.125X (TOURNIQUET CUFF) ×1 IMPLANT
DRAPE SHEET LG 3/4 BI-LAMINATE (DRAPES) ×1 IMPLANT
DRAPE U-SHAPE 47X51 STRL (DRAPES) ×1 IMPLANT
DRSG ADAPTIC 3X8 NADH LF (GAUZE/BANDAGES/DRESSINGS) ×1 IMPLANT
DURAPREP 26ML APPLICATOR (WOUND CARE) ×1 IMPLANT
ELECT REM PT RETURN 15FT ADLT (MISCELLANEOUS) ×1 IMPLANT
GAUZE PAD ABD 8X10 STRL (GAUZE/BANDAGES/DRESSINGS) ×1 IMPLANT
GAUZE SPONGE 4X4 12PLY STRL (GAUZE/BANDAGES/DRESSINGS) ×1 IMPLANT
GLOVE BIOGEL PI IND STRL 7.5 (GLOVE) ×1 IMPLANT
GLOVE BIOGEL PI IND STRL 8.5 (GLOVE) ×1 IMPLANT
GLOVE ORTHO TXT STRL SZ7.5 (GLOVE) ×1 IMPLANT
GLOVE SURG ORTHO 8.5 STRL (GLOVE) ×1 IMPLANT
GOWN STRL REUS W/ TWL XL LVL3 (GOWN DISPOSABLE) ×2 IMPLANT
GOWN STRL REUS W/TWL XL LVL3 (GOWN DISPOSABLE) ×2
HANDPIECE INTERPULSE COAX TIP (DISPOSABLE) ×1
IMMOBILIZER KNEE 20 (SOFTGOODS) ×1
IMMOBILIZER KNEE 20 THIGH 36 (SOFTGOODS) IMPLANT
KIT TURNOVER KIT A (KITS) IMPLANT
MANIFOLD NEPTUNE II (INSTRUMENTS) ×1 IMPLANT
NS IRRIG 1000ML POUR BTL (IV SOLUTION) ×1 IMPLANT
PACK TOTAL KNEE CUSTOM (KITS) ×1 IMPLANT
PIN STEINMAN FIXATION KNEE (PIN) IMPLANT
PROTECTOR NERVE ULNAR (MISCELLANEOUS) ×1 IMPLANT
SET HNDPC FAN SPRY TIP SCT (DISPOSABLE) ×1 IMPLANT
STRIP CLOSURE SKIN 1/2X4 (GAUZE/BANDAGES/DRESSINGS) ×2 IMPLANT
SUT MNCRL AB 3-0 PS2 18 (SUTURE) ×1 IMPLANT
SUT VIC AB 0 CT1 36 (SUTURE) ×1 IMPLANT
SUT VIC AB 1 CT1 36 (SUTURE) ×2 IMPLANT
SUT VIC AB 2-0 CT1 27 (SUTURE) ×1
SUT VIC AB 2-0 CT1 TAPERPNT 27 (SUTURE) ×1 IMPLANT
TIBIAL BASE ROT PLAT SZ 5 KNEE (Knees) ×1 IMPLANT
TRAY CATH INTERMITTENT SS 16FR (CATHETERS) ×1 IMPLANT
WATER STERILE IRR 1000ML POUR (IV SOLUTION) ×2 IMPLANT
YANKAUER SUCT BULB TIP NO VENT (SUCTIONS) ×1 IMPLANT

## 2022-10-06 NOTE — Anesthesia Procedure Notes (Signed)
Anesthesia Regional Block: Adductor canal block   Pre-Anesthetic Checklist: , timeout performed,  Correct Patient, Correct Site, Correct Laterality,  Correct Procedure, Correct Position, site marked,  Risks and benefits discussed,  Pre-op evaluation,  At surgeon's request and post-op pain management  Laterality: Right  Prep: Maximum Sterile Barrier Precautions used, chloraprep       Needles:  Injection technique: Single-shot  Needle Type: Echogenic Stimulator Needle     Needle Length: 9cm  Needle Gauge: 21     Additional Needles:   Procedures:,,,, ultrasound used (permanent image in chart),,    Narrative:  Start time: 10/06/2022 9:32 AM End time: 10/06/2022 9:35 AM Injection made incrementally with aspirations every 5 mL. Anesthesiologist: Elmer Picker, MD

## 2022-10-06 NOTE — Progress Notes (Signed)
PHARMACIST - PHYSICIAN ORDER COMMUNICATION  CONCERNING: P&T Medication Policy on Herbal Medications  DESCRIPTION:  This patient's order for: Co Q-10,  hair/skin/nails tab and oil base liquid has been noted.  This product(s) is classified as an "herbal" or natural product. Due to a lack of definitive safety studies or FDA approval, nonstandard manufacturing practices, plus the potential risk of unknown drug-drug interactions while on inpatient medications, the Pharmacy and Therapeutics Committee does not permit the use of "herbal" or natural products of this type within [facility].   ACTION TAKEN: The pharmacy department is unable to verify this order at this time and your patient has been informed of this safety policy. Please reevaluate patient's clinical condition at discharge and address if the herbal or natural product(s) should be resumed at that time.  Dorna Leitz, PharmD, BCPS 10/06/2022 3:17 PM

## 2022-10-06 NOTE — Op Note (Signed)
NAME: Whitney [name], Whitney [name] MEDICAL RECORD NO: [id number] ACCOUNT NO: [id number] DATE OF BIRTH: 20-Dec-1945 FACILITY: [facility] LOCATION: [id number] PHYSICIAN: Almedia Balls. Ranell Patrick, MD  Operative Report   DATE OF PROCEDURE: 10/06/2022  PREOPERATIVE DIAGNOSIS:  Right knee end-stage arthritis.  POSTOPERATIVE DIAGNOSIS:  Right knee end-stage arthritis.  PROCEDURE PERFORMED:  Right total knee arthroplasty using DePuy Attune prosthesis.  ATTENDING SURGEON:  Almedia Balls. Ranell Patrick, MD  ASSISTANT:  Konrad Felix Dixon, [place], who was scrubbed during the entire procedure, and necessary for satisfactory completion of surgery.  ANESTHESIA:  Spinal anesthesia plus adductor canal block was utilized.  ESTIMATED BLOOD LOSS:  Minimal.  FLUID REPLACEMENT:  1000 mL crystalloid.  COUNTS:  Instrument counts correct.  COMPLICATIONS:  No complications.  ANTIBIOTICS:  Perioperative antibiotics were given.  INDICATIONS:  The patient is a 77 year old female who presents with a history of worsening right knee pain due to end-stage arthritis.  The patient has had progressive pain despite conservative management, desires operative total knee arthroplasty in  order to eliminate pain and restore function.  Informed consent obtained.  DESCRIPTION OF PROCEDURE:  After an adequate level of anesthesia was achieved, the patient was positioned in the supine position, right leg correctly identified.  Nonsterile tourniquet placed proximal thigh.  Right leg sterilely prepped and draped in the  usual manner.  Timeout called, verifying correct patient, correct site, we elevated the leg and exsanguinated the limb with an Esmarch bandage.  We then inflated the tourniquet to 325 mmHg. We placed the knee in flexion and performed a longitudinal  midline incision with a 10 blade scalpel.  We used a fresh 10 blade for the medial parapatellar arthrotomy.  We divided lateral patellofemoral ligaments everting the patella and exposing the distal femur.  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  We entered the distal femur with a step cut drill.  We then placed our intramedullary guide and resected 11 mm off the distal femur for this patient with a flexion contracture.  We were set on 5  degrees of valgus.  We then sized our femur to a size 6 anterior down performing anterior, posterior and chamfer cuts with the 4-in-1 block.  Next, we removed ACL and PCL meniscal tissues.  We then subluxed the tibia anteriorly and performed our tibial  cut with 2 mm off the affected medial side with minimal posterior slope 90 degrees perpendicular to the long axis of the tibia with the oscillating saw and the external tibial cutting jig.  Once we had our tibial cut done, we used a laminar spreader, we  removed excess posterior femoral condyle osteophytes and released the posterior capsule.  We also injected the posterior capsule with a combination of Marcaine, Exparel and saline.  Next we checked our gap, which were symmetric at 5 mm.  We completed our  tibial preparation for the 5 tibia with the modular drill and keel punch.  We then went to the femur and cut the box for the 6 right narrow femur.  We trialled initially with a 5 mm poly.  We then went to a 7 mm poly and could still get full extension  with good flexion stability.  Next, we resurfaced the patella going from a 25 mm thickness down to 15 mm thickness.  We drilled lug holes for the 38 patellar button.  We ranged the knee and had excellent patellar tracking.  We did go ahead and do a  lateral release  as there was slight tilt, tracking improved slightly there with only minimal subluxation.  I did go ahead and do a limited lateral release, tracking was perfect.  After that, we went ahead and removed all trial components.  We pulse  irrigated the bone to clean it, dried the bone well with vacuum mixed high viscosity cement, cemented the components into place  all in one step tibia, femur, and the patella.  We cemented with a 7 mm poly in place.  We allowed the cement to set up with  good compression.  Once the cement was hardened on the back table, we removed excess cement with quarter-inch curved osteotome.  We inspected the entirety of the knee.  We then went ahead and switched out for a size 6 8 mm poly and placed that in the  knee and reduced the knee.  We had excellent stability both in flexion and extension and normal patellar tracking.  We injected the anterior capsule with a combination of Marcaine, Exparel and saline.  We then went ahead and closed the parapatellar  arthrotomy with #1 Vicryl suture, followed by 2-0 Vicryl for subcutaneous closure and 4-0 Monocryl for skin.  Steri-Strips applied followed by sterile dressing.  The patient tolerated surgery well.   PUS D: 10/06/2022 1:42:48 pm T: 10/06/2022 3:26:00 pm  JOB: [id number]/ [id number]

## 2022-10-06 NOTE — Care Plan (Signed)
Ortho Bundle Case Management Note  Patient Details  Name: Whitney [name] MRN: [id number] Date of Birth: 1945-05-18                  R TKA on 10/06/22.  DCP: Home to [facility].  DME: No needs. Has RW.  PT: [facility]   DME Arranged:  N/A DME Agency:       Additional Comments: Please contact me with any questions of if this plan should need to change.    Despina Pole, Case Manager  [facility]  [phone] 10/06/2022, 9:37 AM

## 2022-10-06 NOTE — Anesthesia Postprocedure Evaluation (Signed)
Anesthesia Post Note  Patient: Whitney [name]  Procedure(s) Performed: TOTAL KNEE ARTHROPLASTY (Right: Knee)     Patient location during evaluation: PACU Anesthesia Type: Spinal Level of consciousness: awake and alert Pain management: pain level controlled Vital Signs Assessment: post-procedure vital signs reviewed and stable Respiratory status: spontaneous breathing, nonlabored ventilation and respiratory function stable Cardiovascular status: blood pressure returned to baseline Postop Assessment: no apparent nausea or vomiting, spinal receding, no headache and no backache Anesthetic complications: no   No notable events documented.  Last Vitals:  Vitals:   10/06/22 1430 10/06/22 1445  BP: (!) 166/70 (!) 155/68  Pulse: 79 74  Resp: 20 14  Temp:    SpO2: 98% 97%    Last Pain:  Vitals:   10/06/22 1445  TempSrc:   PainSc: 3                  Shanda Howells

## 2022-10-06 NOTE — Plan of Care (Signed)
  Problem: Activity: Goal: Ability to avoid complications of mobility impairment will improve Outcome: Progressing Goal: Range of joint motion will improve Outcome: Progressing   Problem: Pain Management: Goal: Pain level will decrease with appropriate interventions Outcome: Progressing   Problem: Safety: Goal: Ability to remain free from injury will improve Outcome: Progressing

## 2022-10-06 NOTE — Anesthesia Procedure Notes (Signed)
Spinal  Patient location during procedure: OR Start time: 10/06/2022 11:44 AM End time: 10/06/2022 11:48 AM Reason for block: surgical anesthesia Staffing Performed: resident/CRNA  Resident/CRNA: Garth Bigness, CRNA Performed by: Garth Bigness, CRNA Authorized by: Garth Bigness, CRNA   Preanesthetic Checklist Completed: patient identified, IV checked, site marked, risks and benefits discussed, surgical consent, monitors and equipment checked, pre-op evaluation and timeout performed Spinal Block Patient position: sitting Prep: ChloraPrep Patient monitoring: heart rate, continuous pulse ox and blood pressure Approach: midline Location: L3-4 Injection technique: single-shot Needle Needle type: Pencan  Needle gauge: 24 G Needle length: 9 cm Needle insertion depth: 4 cm Assessment Sensory level: T10 Events: CSF return Additional Notes

## 2022-10-06 NOTE — Progress Notes (Signed)
Orthopedic Tech Progress Note Patient Details:  Whitney [name] 29-May-1945 [id number]  CPM Right Knee CPM Right Knee: On Right Knee Flexion (Degrees): 80 Right Knee Extension (Degrees): 0  Post Interventions Patient Tolerated: Well Instructions Provided: Care of device Ortho Devices Type of Ortho Device: Bone foam zero knee Ortho Device/Splint Interventions: Ordered   Post Interventions Patient Tolerated: Well Instructions Provided: Care of device Patient only able to tolerate 0-80 at this time. Requested that PACU RN notify 3W RN they will need to remove the patient from the CPM in 4 hours as there will be no ortho tech present after 3pm today.  Darleen Crocker [place] A Gerilyn Pilgrim 10/06/2022, 2:24 PM

## 2022-10-06 NOTE — Discharge Instructions (Signed)
Ice to the knee constantly.  Keep the incision covered and clean and dry for one week, then ok to get it wet in the shower.  Do exercise as instructed every hour, please to prevent stiffness.    DO NOT prop anything under the knee, it will make your knee stiff.  Prop under the ankle to encourage your knee to go straight.   Use the walker while you are up and around for balance.  Wear your support stockings 24/7 to prevent blood clots and take baby aspirin twice daily for 30 days also to prevent blood clots  Follow up with Dr Ranell Patrick in two weeks in the office, call [phone] for appt  Please call Dr Ranell Patrick (cell) at [phone] with any questions or concerns    INSTRUCTIONS AFTER JOINT REPLACEMENT   Remove items at home which could result in a fall. This includes throw rugs or furniture in walking pathways ICE to the affected joint every three hours while awake for 30 minutes at a time, for at least the first 3-5 days, and then as needed for pain and swelling.  Continue to use ice for pain and swelling. You may notice swelling that will progress down to the foot and ankle.  This is normal after surgery.  Elevate your leg when you are not up walking on it.   Continue to use the breathing machine you got in the hospital (incentive spirometer) which will help keep your temperature down.  It is common for your temperature to cycle up and down following surgery, especially at night when you are not up moving around and exerting yourself.  The breathing machine keeps your lungs expanded and your temperature down.   DIET:  As you were doing prior to hospitalization, we recommend a well-balanced diet.  DRESSING / WOUND CARE / SHOWERING  You may change your dressing 3-5 days after surgery.  Then change the dressing every day with sterile gauze.  Please use good hand washing techniques before changing the dressing.  Do not use any lotions or creams on the incision until instructed by your  surgeon.  ACTIVITY  Increase activity slowly as tolerated, but follow the weight bearing instructions below.   No driving for 6 weeks or until further direction given by your physician.  You cannot drive while taking narcotics.  No lifting or carrying greater than 10 lbs. until further directed by your surgeon. Avoid periods of inactivity such as sitting longer than an hour when not asleep. This helps prevent blood clots.  You may return to work once you are authorized by your doctor.     WEIGHT BEARING   Weight bearing as tolerated with assist device (walker, cane, etc) as directed, use it as long as suggested by your surgeon or therapist, typically at least 4-6 weeks.   EXERCISES  Results after joint replacement surgery are often greatly improved when you follow the exercise, range of motion and muscle strengthening exercises prescribed by your doctor. Safety measures are also important to protect the joint from further injury. Any time any of these exercises cause you to have increased pain or swelling, decrease what you are doing until you are comfortable again and then slowly increase them. If you have problems or questions, call your caregiver or physical therapist for advice.   Rehabilitation is important following a joint replacement. After just a few days of immobilization, the muscles of the leg can become weakened and shrink (atrophy).  These exercises are designed  to build up the tone and strength of the thigh and leg muscles and to improve motion. Often times heat used for twenty to thirty minutes before working out will loosen up your tissues and help with improving the range of motion but do not use heat for the first two weeks following surgery (sometimes heat can increase post-operative swelling).   These exercises can be done on a training (exercise) mat, on the floor, on a table or on a bed. Use whatever works the best and is most comfortable for you.    Use music or  television while you are exercising so that the exercises are a pleasant break in your day. This will make your life better with the exercises acting as a break in your routine that you can look forward to.   Perform all exercises about fifteen times, three times per day or as directed.  You should exercise both the operative leg and the other leg as well.  Exercises include:   Quad Sets - Tighten up the muscle on the front of the thigh (Quad) and hold for 5-10 seconds.   Straight Leg Raises - With your knee straight (if you were given a brace, keep it on), lift the leg to 60 degrees, hold for 3 seconds, and slowly lower the leg.  Perform this exercise against resistance later as your leg gets stronger.  Leg Slides: Lying on your back, slowly slide your foot toward your buttocks, bending your knee up off the floor (only go as far as is comfortable). Then slowly slide your foot back down until your leg is flat on the floor again.  Angel Wings: Lying on your back spread your legs to the side as far apart as you can without causing discomfort.  Hamstring Strength:  Lying on your back, push your heel against the floor with your leg straight by tightening up the muscles of your buttocks.  Repeat, but this time bend your knee to a comfortable angle, and push your heel against the floor.  You may put a pillow under the heel to make it more comfortable if necessary.   A rehabilitation program following joint replacement surgery can speed recovery and prevent re-injury in the future due to weakened muscles. Contact your doctor or a physical therapist for more information on knee rehabilitation.    CONSTIPATION  Constipation is defined medically as fewer than three stools per week and severe constipation as less than one stool per week.  Even if you have a regular bowel pattern at home, your normal regimen is likely to be disrupted due to multiple reasons following surgery.  Combination of anesthesia,  postoperative narcotics, change in appetite and fluid intake all can affect your bowels.   YOU MUST use at least one of the following options; they are listed in order of increasing strength to get the job done.  They are all available over the counter, and you may need to use some, POSSIBLY even all of these options:    Drink plenty of fluids (prune juice may be helpful) and high fiber foods Colace 100 mg by mouth twice a day  Senokot for constipation as directed and as needed Dulcolax (bisacodyl), take with full glass of water  Miralax (polyethylene glycol) once or twice a day as needed.  If you have tried all these things and are unable to have a bowel movement in the first 3-4 days after surgery call either your surgeon or your primary doctor.  If you experience loose stools or diarrhea, hold the medications until you stool forms back up.  If your symptoms do not get better within 1 week or if they get worse, check with your doctor.  If you experience "the worst abdominal pain ever" or develop nausea or vomiting, please contact the office immediately for further recommendations for treatment.   ITCHING:  If you experience itching with your medications, try taking only a single pain pill, or even half a pain pill at a time.  You can also use Benadryl over the counter for itching or also to help with sleep.   TED HOSE STOCKINGS:  Use stockings on both legs until for at least 2 weeks or as directed by physician office. They may be removed at night for sleeping.  MEDICATIONS:  See your medication summary on the "After Visit Summary" that nursing will review with you.  You may have some home medications which will be placed on hold until you complete the course of blood thinner medication.  It is important for you to complete the blood thinner medication as prescribed.  PRECAUTIONS:  If you experience chest pain or shortness of breath - call 911 immediately for transfer to the hospital emergency  department.   If you develop a fever greater that 101 F, purulent drainage from wound, increased redness or drainage from wound, foul odor from the wound/dressing, or calf pain - CONTACT YOUR SURGEON.                                                   FOLLOW-UP APPOINTMENTS:  If you do not already have a post-op appointment, please call the office for an appointment to be seen by your surgeon.  Guidelines for how soon to be seen are listed in your "After Visit Summary", but are typically between 1-4 weeks after surgery.  OTHER INSTRUCTIONS:   Knee Replacement:  Do not place pillow under knee, focus on keeping the knee straight while resting. CPM instructions: 0-90 degrees, 2 hours in the morning, 2 hours in the afternoon, and 2 hours in the evening. Place foam block, curve side up under heel at all times except when in CPM or when walking.  DO NOT modify, tear, cut, or change the foam block in any way.  POST-OPERATIVE OPIOID TAPER INSTRUCTIONS: It is important to wean off of your opioid medication as soon as possible. If you do not need pain medication after your surgery it is ok to stop day one. Opioids include: Codeine, Hydrocodone(Norco, Vicodin), Oxycodone(Percocet, oxycontin) and hydromorphone amongst others.  Long term and even short term use of opiods can cause: Increased pain response Dependence Constipation Depression Respiratory depression And more.  Withdrawal symptoms can include Flu like symptoms Nausea, vomiting And more Techniques to manage these symptoms Hydrate well Eat regular healthy meals Stay active Use relaxation techniques(deep breathing, meditating, yoga) Do Not substitute Alcohol to help with tapering If you have been on opioids for less than two weeks and do not have pain than it is ok to stop all together.  Plan to wean off of opioids This plan should start within one week post op of your joint replacement. Maintain the same interval or time between taking  each dose and first decrease the dose.  Cut the total daily intake of opioids by one tablet each day Next  start to increase the time between doses. The last dose that should be eliminated is the evening dose.   MAKE SURE YOU:  Understand these instructions.  Get help right away if you are not doing well or get worse.    Thank you for letting us be a part of your medical care team.  It is a privilege we respect greatly.  We hope these instructions will help you stay on track for a fast and full recovery!

## 2022-10-06 NOTE — Brief Op Note (Signed)
10/06/2022  1:35 PM  PATIENT:  Whitney [name]  77 y.o. female  PRE-OPERATIVE DIAGNOSIS:  Right knee osteoarthritis, end stage  POST-OPERATIVE DIAGNOSIS:  Right knee osteoarthritis, end stage  PROCEDURE:  Procedure(s) with comments: TOTAL KNEE ARTHROPLASTY (Right) - 120 DePuy Attune  SURGEON:  Surgeons and Role:    Beverely Low, MD - Primary  PHYSICIAN ASSISTANT:   ASSISTANTS: Thea Gist, PA-C   ANESTHESIA:   regional and spinal  EBL:  40 mL   BLOOD ADMINISTERED:none  DRAINS: none   LOCAL MEDICATIONS USED:  MARCAINE     SPECIMEN:  No Specimen  DISPOSITION OF SPECIMEN:  N/A  COUNTS:  YES  TOURNIQUET:   Total Tourniquet Time Documented: Thigh (Right) - 86 minutes Total: Thigh (Right) - 86 minutes   DICTATION: .Other Dictation: Dictation Number [id number]  PLAN OF CARE: Admit for overnight observation  PATIENT DISPOSITION:  PACU - hemodynamically stable.   Delay start of Pharmacological VTE agent (>24hrs) due to surgical blood loss or risk of bleeding: no

## 2022-10-06 NOTE — Transfer of Care (Signed)
Immediate Anesthesia Transfer of Care Note  Patient: Whitney [name]  Procedure(s) Performed: TOTAL KNEE ARTHROPLASTY (Right: Knee)  Patient Location: PACU  Anesthesia Type:Spinal  Level of Consciousness: awake, alert , oriented, and patient cooperative  Airway & Oxygen Therapy: Patient Spontanous Breathing and Patient connected to face mask oxygen  Post-op Assessment: Report given to RN and Post -op Vital signs reviewed and stable  Post vital signs: Reviewed and stable  Last Vitals:  Vitals Value Taken Time  BP 139/58 10/06/22 1341  Temp    Pulse 76 10/06/22 1343  Resp 17 10/06/22 1343  SpO2 100 % 10/06/22 1343  Vitals shown include unfiled device data.  Last Pain:  Vitals:   10/06/22 1035  TempSrc:   PainSc: 0-No pain      Patients Stated Pain Goal: 5 (10/06/22 0846)  Complications: No notable events documented.

## 2022-10-06 NOTE — Interval H&P Note (Signed)
History and Physical Interval Note:  10/06/2022 9:22 AM  Whitney [name]  has presented today for surgery, with the diagnosis of Right knee osteoarthritis.  The various methods of treatment have been discussed with the patient and family. After consideration of risks, benefits and other options for treatment, the patient has consented to  Procedure(s) with comments: TOTAL KNEE ARTHROPLASTY (Right) - 120 as a surgical intervention.  The patient's history has been reviewed, patient examined, no change in status, stable for surgery.  I have reviewed the patient's chart and labs.  Questions were answered to the patient's satisfaction.     Verlee Rossetti

## 2022-10-07 MED ORDER — LABETALOL HCL 100 MG PO TABS
100.0000 mg | ORAL_TABLET | Freq: Two times a day (BID) | ORAL | Status: DC
  Administered 2022-10-07 – 2022-10-11 (×6): 100 mg via ORAL
  Filled 2022-10-07 (×7): qty 1

## 2022-10-07 NOTE — Progress Notes (Signed)
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    If plan is discharge home, recommend the following: A little help with walking and/or transfers;A little help with bathing/dressing/bathroom;Assistance with cooking/housework;Assist for transportation;Help with stairs or ramp for entrance   Can travel by private vehicle     No  Equipment Recommendations  None recommended by PT    Recommendations for Other Services       Precautions / Restrictions Precautions Precautions: Fall Restrictions Weight Bearing Restrictions: No RLE Weight Bearing: Weight bearing as tolerated     Mobility  Bed Mobility Overal bed mobility: Needs Assistance Bed Mobility: Supine to Sit, Sit to Supine     Supine to sit: Min assist Sit to supine: Min assist   General bed mobility comments: cues for LE management and use of UEs to self assist; physical assist for R LE management, increased time, use of rails    Transfers Overall transfer level: Needs assistance Equipment used: Rolling walker (2 wheels) Transfers: Sit to/from Stand Sit to Stand: Min assist, Mod assist           General transfer comment: cues for LE management and use of UEs to self assist; physical assist to bring wt up and fwd and to balance in initial standing with RW    Ambulation/Gait Ambulation/Gait assistance: Min assist Gait Distance (Feet): 54 Feet Assistive device: Rolling walker (2 wheels) Gait Pattern/deviations: Decreased step length - right, Decreased  step length - left, Shuffle, Step-to pattern, Step-through pattern, Trunk flexed Gait velocity: DECR     General Gait Details: cues for sequence, posture and position from RW; physical assist for balance and RW management   Stairs             Wheelchair Mobility     Tilt Bed    Modified Rankin (Stroke Patients Only)       Balance Overall balance assessment: Needs assistance Sitting-balance support: Feet supported, No upper extremity supported Sitting balance-Leahy Scale: Good     Standing balance support: Bilateral upper extremity supported Standing balance-Leahy Scale: Poor                              Cognition Arousal: Alert Behavior During Therapy: WFL for tasks assessed/performed Overall Cognitive Status: Within Functional Limits for tasks assessed                                          Exercises Total Joint Exercises Ankle Circles/Pumps: AROM, Both, 15 reps, Supine Quad Sets: AROM, Both, 10 reps, Supine Heel Slides: AAROM, Right, 15 reps, Supine Straight Leg Raises: AAROM, Right, 10 reps, Supine    General Comments        Pertinent Vitals/Pain Pain Assessment Pain Assessment: 0-10 Pain Score: 5  Pain Location: R knee Pain Descriptors / Indicators: Aching, Sore Pain Intervention(s): Limited activity within patient's tolerance, Monitored during session, Premedicated  before session, Ice applied    Home Living                          Prior Function            PT Goals (current goals can now be found in the care plan section) Acute Rehab PT Goals Patient Stated Goal: To rehab to regain IND PT Goal Formulation: With patient Time For Goal Achievement: 10/07/22 Potential to Achieve Goals: Good Progress towards PT goals: Progressing toward goals    Frequency    7X/week      PT Plan      Co-evaluation              AM-PAC PT "6 Clicks" Mobility   Outcome Measure  Help needed turning  from your back to your side while in a flat bed without using bedrails?: A Little Help needed moving from lying on your back to sitting on the side of a flat bed without using bedrails?: A Little Help needed moving to and from a bed to a chair (including a wheelchair)?: A Little Help needed standing up from a chair using your arms (e.g., wheelchair or bedside chair)?: A Lot Help needed to walk in hospital room?: A Little Help needed climbing 3-5 steps with a railing? : A Lot 6 Click Score: 16    End of Session Equipment Utilized During Treatment: Gait belt;Right knee immobilizer Activity Tolerance: Patient tolerated treatment well Patient left: with call bell/phone within reach;in bed;with bed alarm set Nurse Communication: Mobility status PT Visit Diagnosis: Difficulty in walking, not elsewhere classified (R26.2)     Time: 1457-1530 PT Time Calculation (min) (ACUTE ONLY): 33 min  Charges:    $Gait Training: 8-22 mins $Therapeutic Activity: 8-22 mins PT General Charges $$ ACUTE PT VISIT: 1 Visit                     Mauro Kaufmann PT Acute Rehabilitation Services Pager [phone] Office [phone]    Noha Milberger 10/07/2022, 4:41 PM

## 2022-10-07 NOTE — Progress Notes (Signed)
   Subjective: 1 Day Post-Op Procedure(s) (LRB): TOTAL KNEE ARTHROPLASTY (Right)  Pt c/o moderate pain/soreness this morning but tolerable Denies any new symptoms overnight Plan for SNF placement once ready Otherwise doing fairly well Patient reports pain as moderate.  Objective:   VITALS:   Vitals:   10/07/22 0227 10/07/22 0636  BP: (!) 148/65 (!) 134/56  Pulse: 71 71  Resp: 17 15  Temp: 98 F (36.7 C) 98.1 F (36.7 C)  SpO2: 97% 92%    Right knee: dressing intact Nv intact distally No rashes or edema  Gentle rom with some guarding   LABS No results for input(s): "HGB", "HCT", "WBC", "PLT" in the last 72 hours.  No results for input(s): "NA", "K", "BUN", "CREATININE", "GLUCOSE" in the last 72 hours.   Assessment/Plan: 1 Day Post-Op Procedure(s) (LRB): TOTAL KNEE ARTHROPLASTY (Right) Plan for SNF placement once bed available If bed available today she is cleared to d/c today after therapy but discussed the possible need to stay until Monday Continue PT/OT Pain management Pulmonary toilet Call me for d/c order if needed today     Elizebeth Koller, MPAS [facility] now [facility]  [place] [address], [address], [place], [place] [address] Phone: [phone] [email or web address] Facebook  [employer]

## 2022-10-07 NOTE — Progress Notes (Signed)
   10/07/22 1804  Assess: MEWS Score  Temp 98.2 F (36.8 C)  BP (!) 204/90  Pulse Rate 78  Resp 16  Level of Consciousness Alert  SpO2 96 %  O2 Device Room Air  Patient Activity (if Appropriate) In bed  Assess: MEWS Score  MEWS Temp 0  MEWS Systolic 2  MEWS Pulse 0  MEWS RR 0  MEWS LOC 0  MEWS Score 2  MEWS Score Color Yellow  Assess: if the MEWS score is Yellow or Red  Were vital signs accurate and taken at a resting state? Yes  Does the patient meet 2 or more of the SIRS criteria? No  MEWS guidelines implemented  Yes, yellow  Treat  MEWS Interventions Considered administering scheduled or prn medications/treatments as ordered  Take Vital Signs  Increase Vital Sign Frequency  Yellow: Q2hr x1, continue Q4hrs until patient remains green for 12hrs  Escalate  MEWS: Escalate Yellow: Discuss with charge nurse and consider notifying provider and/or RRT  Notify: Charge Nurse/RN  Name of Charge Nurse/RN Notified Brayton Caves, RN  Provider Notification  Provider Name/Title Clement Husbands  Date Provider Notified 10/07/22  Time Provider Notified 1806  Method of Notification Page  Notification Reason Other (Comment) (elevated BP)  Provider response See new orders  Date of Provider Response 10/07/22  Time of Provider Response 1812  Notify: Rapid Response  Name of Rapid Response RN Notified n/a  Assess: SIRS CRITERIA  SIRS Temperature  0  SIRS Pulse 0  SIRS Respirations  0  SIRS WBC 0  SIRS Score Sum  0   Brad Dixon-PA made aware of Pt's elevated BP, received order for Labetalol 100mg  PO BID for SBP of >150 and start initial dose now.  Pt is resting in bed, on room air, no SOB, denies any chest pain, not in any distress. Pt is complaining of pain to R knee, PRN pain med given. Jessie-Charge RN made aware. Yellow MEWS protocol initiated.

## 2022-10-07 NOTE — Plan of Care (Signed)

## 2022-10-07 NOTE — Evaluation (Signed)
Physical Therapy Evaluation Patient Details Name: Whitney [name] MRN: [id number] DOB: [birth year] Today's Date: 10/07/2022  History of Present Illness  Pt s/p R TKR and with hx of CKD, neuropathy bil feet, PTSD, bil RCR, CVA and L TKR (9/23)  Clinical Impression  Pt admitted as above and presenting with functional mobility limitations 2* decreased R LE strength/ROM, post op pain and balance deficits.   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      If plan is discharge home, recommend the following: A little help with walking and/or transfers;A little help with bathing/dressing/bathroom;Assistance with cooking/housework;Assist for transportation;Help with stairs or ramp for entrance   Can travel by private vehicle   No    Equipment Recommendations None recommended by PT  Recommendations for Other Services       Functional Status Assessment Patient has had a recent decline in their functional status and demonstrates the ability to make significant improvements in function in a reasonable and predictable amount of time.     Precautions / Restrictions Precautions Precautions: Fall Restrictions Weight Bearing Restrictions: No RLE Weight Bearing: Weight bearing as tolerated      Mobility  Bed Mobility Overal bed mobility: Needs Assistance Bed Mobility: Supine to Sit     Supine to sit: Min assist     General bed mobility comments: cues for LE management and use of UEs to self assist; physical assist for R LE management, increased time, use of rails    Transfers Overall transfer level: Needs assistance Equipment used: Rolling walker (2 wheels) Transfers: Sit to/from Stand Sit to Stand: Min assist, Mod assist           General transfer comment: cues for LE management and use of UEs to self assist; physical assist to bring wt up and fwd and  to balance in initial standing with RW    Ambulation/Gait Ambulation/Gait assistance: Min assist Gait Distance (Feet): 48 Feet Assistive device: Rolling walker (2 wheels) Gait Pattern/deviations: Decreased step length - right, Decreased step length - left, Shuffle, Step-to pattern, Step-through pattern, Trunk flexed Gait velocity: DECR     General Gait Details: cues for sequence, posture and position from RW; physical assist for balance and RW management  Stairs            Wheelchair Mobility     Tilt Bed    Modified Rankin (Stroke Patients Only)       Balance Overall balance assessment: Needs assistance Sitting-balance support: Feet supported, No upper extremity supported Sitting balance-Leahy Scale: Good     Standing balance support: Bilateral upper extremity supported Standing balance-Leahy Scale: Poor                               Pertinent Vitals/Pain Pain Assessment Pain Assessment: 0-10 Pain Score: 6  Pain Location: R knee Pain Descriptors / Indicators: Aching, Sore Pain Intervention(s): Limited activity within patient's tolerance, Monitored during session, Premedicated before session, Ice applied    Home Living Family/patient expects to be discharged to:: Skilled nursing facility                        Prior Function Prior Level of Function : Independent/Modified Independent  Extremity/Trunk Assessment   Upper Extremity Assessment Upper Extremity Assessment: Overall WFL for tasks assessed    Lower Extremity Assessment Lower Extremity Assessment: RLE deficits/detail RLE Deficits / Details: 2+/5 quads with AAROM at knee -5 - 45    Cervical / Trunk Assessment Cervical / Trunk Assessment: Normal  Communication   Communication Communication: No apparent difficulties  Cognition Arousal: Alert Behavior During Therapy: WFL for tasks assessed/performed Overall Cognitive Status: Within Functional  Limits for tasks assessed                                          General Comments      Exercises Total Joint Exercises Ankle Circles/Pumps: AROM, Both, 15 reps, Supine Quad Sets: AROM, Both, 10 reps, Supine Heel Slides: AAROM, Right, 15 reps, Supine Straight Leg Raises: AAROM, Right, 10 reps, Supine   Assessment/Plan    PT Assessment Patient needs continued PT services  PT Problem List Decreased strength;Decreased range of motion;Decreased activity tolerance;Decreased balance;Decreased mobility;Decreased knowledge of use of DME;Pain       PT Treatment Interventions DME instruction;Gait training;Stair training;Functional mobility training;Therapeutic activities;Therapeutic exercise;Patient/family education    PT Goals (Current goals can be found in the Care Plan section)  Acute Rehab PT Goals Patient Stated Goal: To rehab to regain IND PT Goal Formulation: With patient Time For Goal Achievement: 10/07/22 Potential to Achieve Goals: Good    Frequency 7X/week     Co-evaluation               AM-PAC PT "6 Clicks" Mobility  Outcome Measure Help needed turning from your back to your side while in a flat bed without using bedrails?: A Little Help needed moving from lying on your back to sitting on the side of a flat bed without using bedrails?: A Little Help needed moving to and from a bed to a chair (including a wheelchair)?: A Little Help needed standing up from a chair using your arms (e.g., wheelchair or bedside chair)?: A Lot Help needed to walk in hospital room?: A Little Help needed climbing 3-5 steps with a railing? : A Lot 6 Click Score: 16    End of Session Equipment Utilized During Treatment: Gait belt;Right knee immobilizer Activity Tolerance: Patient tolerated treatment well Patient left: in chair;with call bell/phone within reach;with chair alarm set;with family/visitor present Nurse Communication: Mobility status PT Visit Diagnosis:  Difficulty in walking, not elsewhere classified (R26.2)    Time: 1610-9604 PT Time Calculation (min) (ACUTE ONLY): 38 min   Charges:   PT Evaluation $PT Eval Low Complexity: 1 Low PT Treatments $Gait Training: 8-22 mins $Therapeutic Exercise: 8-22 mins PT General Charges $$ ACUTE PT VISIT: 1 Visit         Mauro Kaufmann PT Acute Rehabilitation Services Pager [phone] Office [phone]   Melva Faux 10/07/2022, 12:49 PM

## 2022-10-07 NOTE — TOC Initial Note (Signed)
Transition of Care [facility]) - Initial/Assessment Note    Patient Details  Name: Whitney [name] MRN: [id number] Date of Birth: 1945-11-24  Transition of Care [facility]) CM/SW Contact:    Catalina Gravel, LCSW Phone Number: 10/07/2022, 4:26 PM  Clinical Narrative:                 CSW met with pt at bedside.  Pt states that her plan is to DC to [facility] [place] location and this was pre- arranged.   CSW called [facility] at [phone], no answer to this number. Pt. not indicated for DC.  TOC to follow.    Expected Discharge Plan: Skilled Nursing Facility Barriers to Discharge: Continued Medical Work up   Patient Goals and CMS Choice Patient states their goals for this hospitalization and ongoing recovery are:: Pt plan is to DC to [facility] in [place]          Expected Discharge Plan and Services In-house Referral: Clinical Social Work     Living arrangements for the past 2 months: Single Family Home                 DME Arranged: N/A                    Prior Living Arrangements/Services Living arrangements for the past 2 months: Single Family Home Lives with:: Spouse Patient language and need for interpreter reviewed:: Yes Do you feel safe going back to the place where you live?: Yes      Need for Family Participation in Patient Care: Yes (Comment) Care giver support system in place?: Yes (comment)   Criminal Activity/Legal Involvement Pertinent to Current Situation/Hospitalization: No - Comment as needed  Activities of Daily Living Home Assistive Devices/Equipment: Blood pressure cuff, Cane (specify quad or straight), Walker (specify type) ADL Screening (condition at time of admission) Patient's cognitive ability adequate to safely complete daily activities?: Yes Is the patient deaf or have difficulty hearing?: No Does the patient have difficulty seeing, even when wearing glasses/contacts?: No Does the patient have difficulty concentrating, remembering, or making  decisions?: Yes Patient able to express need for assistance with ADLs?: Yes Does the patient have difficulty dressing or bathing?: No Independently performs ADLs?: Yes (appropriate for developmental age) Does the patient have difficulty walking or climbing stairs?: No Weakness of Legs: Both Weakness of Arms/Hands: Both  Permission Sought/Granted                  Emotional Assessment Appearance:: Appears younger than stated age Attitude/Demeanor/Rapport: Gracious Affect (typically observed): Accepting Orientation: : Oriented to Self, Oriented to Place, Oriented to Situation Alcohol / Substance Use: Not Applicable    Admission diagnosis:  Status post total knee replacement, right [Z96.651] Patient Active Problem List   Diagnosis Date Noted   Status post total knee replacement, right 10/06/2022   H/O total knee replacement, left 10/21/2021   RCT (rotator cuff tear) 11/28/2013   Rotator cuff (capsule) sprain 01/31/2013   PCP:  Marylen Ponto, MD Pharmacy:   [facility] [phone] Rosalita Levan, [place] - [address] AT [facility] [address] [place] [address] Phone: [phone] Fax: [phone]  [facility] [place], [place] - [address] [place] [address] Phone: [phone] Fax: [phone]     Social Determinants of Health (SDOH) Social History: SDOH Screenings   Food Insecurity: No Food Insecurity (10/06/2022)  Housing:  Low Risk  (10/06/2022)  Transportation Needs: No Transportation Needs (10/06/2022)  Utilities: Not At Risk (10/06/2022)  Tobacco Use: Low Risk  (10/06/2022)   SDOH Interventions:     Readmission Risk Interventions     No data to display

## 2022-10-08 NOTE — Plan of Care (Signed)

## 2022-10-08 NOTE — Progress Notes (Signed)
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    If plan is discharge home, recommend the following: A little help with walking and/or transfers;A little help with bathing/dressing/bathroom;Assistance with cooking/housework;Assist for transportation;Help with stairs or ramp for entrance   Can travel by private vehicle     No  Equipment Recommendations  None recommended by PT    Recommendations for Other Services       Precautions / Restrictions Precautions Precautions: Fall Restrictions Weight Bearing Restrictions: No RLE Weight Bearing: Weight bearing as tolerated     Mobility  Bed Mobility Overal bed mobility: Needs Assistance Bed Mobility: Sit to Supine     Supine to sit: Min assist Sit to supine: Min assist   General bed mobility comments: cues for LE management and use of UEs to self assist; physical assist for R LE management, increased time, use of rails    Transfers Overall transfer level: Needs assistance Equipment used: Rolling walker (2 wheels) Transfers: Sit to/from Stand Sit to Stand: Min assist, Mod assist           General transfer comment: cues for LE management and use of UEs to self assist; physical assist to bring wt up and fwd and to balance in initial standing with RW    Ambulation/Gait Ambulation/Gait assistance: Min assist, Mod assist Gait Distance (Feet): 38 Feet Assistive device: Rolling walker (2 wheels) Gait Pattern/deviations: Decreased step length - right, Decreased step length - left, Shuffle, Step-to pattern, Step-through  pattern, Trunk flexed Gait velocity: DECR     General Gait Details: cues for sequence, posture and position from RW; physical assist for balance and RW management; several episodes balance loss requiring assist to prevent falling   Stairs             Wheelchair Mobility     Tilt Bed    Modified Rankin (Stroke Patients Only)       Balance Overall balance assessment: Needs assistance Sitting-balance support: Feet supported, No upper extremity supported Sitting balance-Leahy Scale: Good     Standing balance support: Bilateral upper extremity supported Standing balance-Leahy Scale: Poor                              Cognition Arousal: Lethargic, Suspect due to medications Behavior During Therapy: WFL for tasks assessed/performed Overall Cognitive Status: Within Functional Limits for tasks assessed                                          Exercises Total Joint Exercises Ankle Circles/Pumps: AROM, Both, 15 reps, Supine Quad Sets: AROM, Both, 10 reps, Supine Heel Slides: AAROM, Right, 15 reps, Supine Straight Leg Raises: AAROM, Right, Supine, 20 reps    General Comments        Pertinent Vitals/Pain Pain Assessment Pain Assessment: 0-10 Pain Score: 5  Pain Location: R knee Pain Descriptors / Indicators: Aching, Sore Pain Intervention(s): Limited activity within patient's tolerance, Monitored during  session, Ice applied    Home Living                          Prior Function            PT Goals (current goals can now be found in the care plan section) Acute Rehab PT Goals Patient Stated Goal: To rehab to regain IND PT Goal Formulation: With patient Time For Goal Achievement: 10/07/22 Potential to Achieve Goals: Good Progress towards PT goals: Not progressing toward goals - comment (continues lethargic - ?meds)    Frequency    7X/week      PT Plan      Co-evaluation              AM-PAC PT "6  Clicks" Mobility   Outcome Measure  Help needed turning from your back to your side while in a flat bed without using bedrails?: A Little Help needed moving from lying on your back to sitting on the side of a flat bed without using bedrails?: A Little Help needed moving to and from a bed to a chair (including a wheelchair)?: A Little Help needed standing up from a chair using your arms (e.g., wheelchair or bedside chair)?: A Lot Help needed to walk in hospital room?: A Lot Help needed climbing 3-5 steps with a railing? : A Lot 6 Click Score: 15    End of Session Equipment Utilized During Treatment: Gait belt;Right knee immobilizer Activity Tolerance: Patient tolerated treatment well;Patient limited by lethargy Patient left: in bed;with call bell/phone within reach;with bed alarm set;with nursing/sitter in room Nurse Communication: Mobility status PT Visit Diagnosis: Difficulty in walking, not elsewhere classified (R26.2)     Time: 2130-8657 PT Time Calculation (min) (ACUTE ONLY): 35 min  Charges:    $Gait Training: 23-37 mins PT General Charges $$ ACUTE PT VISIT: 1 Visit                     Mauro Kaufmann PT Acute Rehabilitation Services Pager [phone] Office [phone]    [name] 10/08/2022, 4:18 PM

## 2022-10-08 NOTE — Progress Notes (Signed)
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  With increased time, pt up to EOB sitting and up to ambulate in hall with continued assist for balance, sequencing and RW management.    If plan is discharge home, recommend the following: A little help with walking and/or transfers;A little help with bathing/dressing/bathroom;Assistance with cooking/housework;Assist for transportation;Help with stairs or ramp for entrance   Can travel by private vehicle     No  Equipment Recommendations  None recommended by PT    Recommendations for Other Services       Precautions / Restrictions Precautions Precautions: Fall Restrictions Weight Bearing Restrictions: No RLE Weight Bearing: Weight bearing as tolerated     Mobility  Bed Mobility Overal bed mobility: Needs Assistance Bed Mobility: Supine to Sit     Supine to sit: Min assist Sit to supine: Min assist   General bed mobility comments: cues for LE management and use of UEs to self assist; physical assist for R LE management, increased time, use of rails    Transfers Overall transfer level: Needs assistance Equipment used: Rolling walker (2 wheels) Transfers: Sit to/from Stand Sit to Stand: Min assist           General transfer comment: cues for LE management and use of UEs to self assist; physical assist to bring wt up and fwd and to balance in initial standing with RW    Ambulation/Gait Ambulation/Gait assistance: Min assist Gait Distance (Feet): 38 Feet Assistive device: Rolling walker (2 wheels) Gait Pattern/deviations: Decreased step length - right, Decreased step length - left, Shuffle, Step-to pattern,  Step-through pattern, Trunk flexed Gait velocity: DECR     General Gait Details: cues for sequence, posture and position from RW; physical assist for balance and RW management   Stairs             Wheelchair Mobility     Tilt Bed    Modified Rankin (Stroke Patients Only)       Balance Overall balance assessment: Needs assistance Sitting-balance support: Feet supported, No upper extremity supported Sitting balance-Leahy Scale: Good     Standing balance support: Bilateral upper extremity supported Standing balance-Leahy Scale: Poor                              Cognition Arousal: Lethargic, Suspect due to medications Behavior During Therapy: WFL for tasks assessed/performed Overall Cognitive Status: Within Functional Limits for tasks assessed                                          Exercises Total Joint Exercises Ankle Circles/Pumps: AROM, Both, 15 reps, Supine Quad Sets: AROM, Both, 10 reps, Supine Heel Slides: AAROM, Right, 15 reps, Supine Straight Leg Raises: AAROM, Right, Supine, 20 reps    General Comments        Pertinent Vitals/Pain Pain Assessment Pain Assessment: 0-10 Pain Score: 5  Pain Location: R knee Pain Descriptors / Indicators: Aching, Sore Pain Intervention(s): Limited activity within patient's tolerance, Monitored during session, Premedicated before session, Ice  applied    Home Living                          Prior Function            PT Goals (current goals can now be found in the care plan section) Acute Rehab PT Goals Patient Stated Goal: To rehab to regain IND PT Goal Formulation: With patient Time For Goal Achievement: 10/07/22 Potential to Achieve Goals: Good Progress towards PT goals: Progressing toward goals    Frequency    7X/week      PT Plan      Co-evaluation              AM-PAC PT "6 Clicks" Mobility   Outcome Measure  Help needed turning from your back  to your side while in a flat bed without using bedrails?: A Little Help needed moving from lying on your back to sitting on the side of a flat bed without using bedrails?: A Little Help needed moving to and from a bed to a chair (including a wheelchair)?: A Little Help needed standing up from a chair using your arms (e.g., wheelchair or bedside chair)?: A Lot Help needed to walk in hospital room?: A Little Help needed climbing 3-5 steps with a railing? : A Lot 6 Click Score: 16    End of Session Equipment Utilized During Treatment: Gait belt;Right knee immobilizer Activity Tolerance: Patient tolerated treatment well;Patient limited by lethargy Patient left: in chair;with call bell/phone within reach;with chair alarm set Nurse Communication: Mobility status PT Visit Diagnosis: Difficulty in walking, not elsewhere classified (R26.2)     Time: 2952-8413 PT Time Calculation (min) (ACUTE ONLY): 24 min  Charges:    $Gait Training: 23-37 mins $Therapeutic Exercise: 8-22 mins PT General Charges $$ ACUTE PT VISIT: 1 Visit                     Mauro Kaufmann PT Acute Rehabilitation Services Pager [phone] Office [phone]    Claudette Wermuth 10/08/2022, 12:28 PM

## 2022-10-08 NOTE — Progress Notes (Signed)
Orthopedics Progress Note  Subjective: Patient reports moderate pain. She has not been up much  Objective:  Vitals:   10/08/22 0621 10/08/22 0819  BP: (!) 152/55 (!) 146/62  Pulse: 69   Resp: 15   Temp: 97.9 F (36.6 C)   SpO2: 97%     General: Awake and alert  Musculoskeletal: Right knee dressing changed to Aquacel. TED hose applied. Mod pain with knee flexion to 60 degrees. No pain with calf pumps Neurovascularly intact  Lab Results  Component Value Date   WBC 6.4 09/26/2022   HGB 13.5 09/26/2022   HCT 42.6 09/26/2022   MCV 92.4 09/26/2022   PLT 206 09/26/2022       Component Value Date/Time   NA 137 09/26/2022 1514   K 4.2 09/26/2022 1514   CL 103 09/26/2022 1514   CO2 23 09/26/2022 1514   GLUCOSE 180 (H) 09/26/2022 1514   BUN 15 09/26/2022 1514   CREATININE 1.22 (H) 09/26/2022 1514   CALCIUM 9.2 09/26/2022 1514   GFRNONAA 46 (L) 09/26/2022 1514   GFRAA 74 (L) 11/29/2013 0511    No results found for: "INR", "PROTIME"  Assessment/Plan: POD #2 s/p Procedure(s): TOTAL KNEE ARTHROPLASTY Continue PT, OT and discharge planning with the plan to go to [facility] for short duration SNF/rehab stay  Almedia Balls. Ranell Patrick, MD 10/08/2022 9:30 AM

## 2022-10-08 NOTE — Progress Notes (Signed)
Physical Therapy Treatment Patient Details Name: Whitney [name] MRN: [id number] DOB: 07/12/1945 Today's Date: 10/08/2022   History of Present Illness Pt s/p R TKR and with hx of CKD, neuropathy bil feet, PTSD, bil RCR, CVA and L TKR (9/23)    PT Comments  Pt continues very cooperative but with noted increased lethargy and attempting to fall asleep multiple times - ?pain meds.  Pt rousable and performed therex program with assist but OOB deferred for safety at this time.  Will follow up late am for OOB.    If plan is discharge home, recommend the following: A little help with walking and/or transfers;A little help with bathing/dressing/bathroom;Assistance with cooking/housework;Assist for transportation;Help with stairs or ramp for entrance   Can travel by private vehicle     No  Equipment Recommendations  None recommended by PT    Recommendations for Other Services       Precautions / Restrictions Precautions Precautions: Fall Restrictions Weight Bearing Restrictions: No RLE Weight Bearing: Weight bearing as tolerated     Mobility  Bed Mobility                    Transfers                        Ambulation/Gait                   Stairs             Wheelchair Mobility     Tilt Bed    Modified Rankin (Stroke Patients Only)       Balance                                            Cognition Arousal: Lethargic, Suspect due to medications Behavior During Therapy: WFL for tasks assessed/performed Overall Cognitive Status: Within Functional Limits for tasks assessed                                          Exercises Total Joint Exercises Ankle Circles/Pumps: AROM, Both, 15 reps, Supine Quad Sets: AROM, Both, 10 reps, Supine Heel Slides: AAROM, Right, 15 reps, Supine Straight Leg Raises: AAROM, Right, Supine, 20 reps    General Comments        Pertinent Vitals/Pain Pain  Assessment Pain Assessment: 0-10 Pain Score: 6  Pain Location: R knee Pain Descriptors / Indicators: Aching, Sore Pain Intervention(s): Limited activity within patient's tolerance, Monitored during session, Premedicated before session, Ice applied    Home Living                          Prior Function            PT Goals (current goals can now be found in the care plan section) Acute Rehab PT Goals Patient Stated Goal: To rehab to regain IND PT Goal Formulation: With patient Time For Goal Achievement: 10/07/22 Potential to Achieve Goals: Good Progress towards PT goals: Progressing toward goals    Frequency    7X/week      PT Plan      Co-evaluation              AM-PAC PT "6 Clicks" Mobility  Outcome Measure  Help needed turning from your back to your side while in a flat bed without using bedrails?: A Little Help needed moving from lying on your back to sitting on the side of a flat bed without using bedrails?: A Little Help needed moving to and from a bed to a chair (including a wheelchair)?: A Little Help needed standing up from a chair using your arms (e.g., wheelchair or bedside chair)?: A Lot Help needed to walk in hospital room?: A Little Help needed climbing 3-5 steps with a railing? : A Lot 6 Click Score: 16    End of Session   Activity Tolerance: Patient limited by fatigue Patient left: in bed;with call bell/phone within reach;with bed alarm set Nurse Communication: Mobility status PT Visit Diagnosis: Difficulty in walking, not elsewhere classified (R26.2)     Time: 4098-1191 PT Time Calculation (min) (ACUTE ONLY): 21 min  Charges:    $Therapeutic Exercise: 8-22 mins PT General Charges $$ ACUTE PT VISIT: 1 Visit                     Mauro Kaufmann PT Acute Rehabilitation Services Pager [phone] Office [phone]    Annalysia Willenbring 10/08/2022, 12:23 PM

## 2022-10-09 ENCOUNTER — Encounter (HOSPITAL_COMMUNITY): Payer: Self-pay | Admitting: Orthopedic Surgery

## 2022-10-09 NOTE — Plan of Care (Signed)
Problem: Activity: Goal: Ability to avoid complications of mobility impairment will improve Outcome: Progressing Goal: Range of joint motion will improve Outcome: Progressing   Problem: Pain Management: Goal: Pain level will decrease with appropriate interventions Outcome: Progressing

## 2022-10-09 NOTE — Progress Notes (Signed)
   Subjective: 3 Days Post-Op Procedure(s) (LRB): TOTAL KNEE ARTHROPLASTY (Right)  Pt resting comfortably Still progressing slowly with therapy Plan for SNF placement today if bed available Denies any new symptoms or issues Patient reports pain as moderate.  Objective:   VITALS:   Vitals:   10/09/22 0214 10/09/22 0511  BP: (!) 143/59 (!) 150/52  Pulse: 62 63  Resp: 16 16  Temp: 98.1 F (36.7 C) 97.9 F (36.6 C)  SpO2:  96%    Right knee incision healing well Dressing in place Nv intact distally No rashes or edema distally  LABS No results for input(s): "HGB", "HCT", "WBC", "PLT" in the last 72 hours.  No results for input(s): "NA", "K", "BUN", "CREATININE", "GLUCOSE" in the last 72 hours.   Assessment/Plan: 3 Days Post-Op Procedure(s) (LRB): TOTAL KNEE ARTHROPLASTY (Right) D/c to SNF today F/u in the office in 2 weeks Pain management Continue PT/OT    Alphonsa Overall PA-C, MPAS [facility] is now [facility]  [place] [address], [place], [place] [address] Phone: [phone] [email or web address] Facebook  [employer]

## 2022-10-09 NOTE — NC FL2 (Cosign Needed Addendum)
[place] MEDICAID FL2 LEVEL OF CARE FORM     IDENTIFICATION  Patient Name: Whitney [name] Birthdate: [birth year] Sex: female Admission Date (Current Location): 10/06/2022  [facility] and [place] Number:  Producer, television/film/video and Address:  [facility],  [address] [place], [place] [address]      Provider Number: [id number]  Attending Physician Name and Address:  Beverely Low, MD  Relative Name and Phone Number:  son, Yicel Sherlin [phone]    Current Level of Care: Hospital Recommended Level of Care: Skilled Nursing Facility Prior Approval Number:    Date Approved/Denied:   PASRR Number: [id number]  Discharge Plan: SNF    Current Diagnoses: Patient Active Problem List   Diagnosis Date Noted   Status post total knee replacement, right 10/06/2022   H/O total knee replacement, left 10/21/2021   RCT (rotator cuff tear) 11/28/2013   Rotator cuff (capsule) sprain 01/31/2013    Orientation RESPIRATION BLADDER Height & Weight     Self, Time, Situation, Place  Normal Continent Weight: 184 lb (83.5 kg) Height:  5\' 2"  (157.5 cm)  BEHAVIORAL SYMPTOMS/MOOD NEUROLOGICAL BOWEL NUTRITION STATUS      Continent Diet (regular)  AMBULATORY STATUS COMMUNICATION OF NEEDS Skin   Limited Assist Verbally Other (Comment) (surgial incision only)                       Personal Care Assistance Level of Assistance  Bathing, Dressing Bathing Assistance: Limited assistance   Dressing Assistance: Limited assistance     Functional Limitations Info  Sight, Hearing, Speech Sight Info: Adequate Hearing Info: Adequate Speech Info: Adequate    SPECIAL CARE FACTORS FREQUENCY  PT (By licensed PT), OT (By licensed OT)     PT Frequency: 5x/wk OT Frequency: 5x/wk            Contractures Contractures Info: Not present    Additional Factors Info  Code Status, Allergies, Psychotropic Code Status Info: Full Allergies Info: Talwin (Pentazocine), Other, Codeine,  Erythromycin, Morphine And Codeine, Percocet (Oxycodone-acetaminophen) Psychotropic Info: see MAR         Current Medications (10/09/2022):  This is the current hospital active medication list Current Facility-Administered Medications  Medication Dose Route Frequency Provider Last Rate Last Admin   0.9 %  sodium chloride infusion   Intravenous Continuous Beverely Low, MD   Stopped at 10/07/22 0946   acetaminophen (TYLENOL) tablet 325-650 mg  325-650 mg Oral Q6H PRN Beverely Low, MD       albuterol (PROVENTIL) (2.5 MG/3ML) 0.083% nebulizer solution 2.5 mg  2.5 mg Nebulization Q6H PRN Beverely Low, MD       aspirin chewable tablet 81 mg  81 mg Oral BID Beverely Low, MD   81 mg at 10/09/22 0831   atorvastatin (LIPITOR) tablet 40 mg  40 mg Oral Wendi Maya, MD   40 mg at 10/08/22 2217   buPROPion (WELLBUTRIN XL) 24 hr tablet 150 mg  150 mg Oral Daily Beverely Low, MD   150 mg at 10/09/22 0831   cholecalciferol (VITAMIN D3) 25 MCG (1000 UNIT) tablet 1,000 Units  1,000 Units Oral Daily Beverely Low, MD   1,000 Units at 10/09/22 0829   citalopram (CELEXA) tablet 40 mg  40 mg Oral Daily Beverely Low, MD   40 mg at 10/09/22 0831   clonazePAM (KLONOPIN) tablet 1 mg  1 mg Oral BID Beverely Low, MD   1 mg at 10/09/22 3875   cyanocobalamin (VITAMIN B12) tablet 1,000  mcg  1,000 mcg Oral Daily Beverely Low, MD   1,000 mcg at 10/09/22 0831   docusate sodium (COLACE) capsule 100 mg  100 mg Oral BID Beverely Low, MD   100 mg at 10/09/22 0829   donepezil (ARICEPT) tablet 5 mg  5 mg Oral Wendi Maya, MD   5 mg at 10/08/22 2216   [START ON 10/11/2022] Dulaglutide SOPN 3 mg  3 mg Subcutaneous Q Caprice Renshaw, MD       furosemide (LASIX) tablet 20 mg  20 mg Oral Daily Beverely Low, MD   20 mg at 10/09/22 5784   gabapentin (NEURONTIN) capsule 300 mg  300 mg Oral BID Beverely Low, MD   300 mg at 10/09/22 0831   HYDROcodone-acetaminophen (NORCO/VICODIN) 5-325 MG per tablet 1-2 tablet  1-2  tablet Oral Q4H PRN Beverely Low, MD   1 tablet at 10/09/22 6962   irbesartan (AVAPRO) tablet 300 mg  300 mg Oral Daily Beverely Low, MD   300 mg at 10/09/22 0830   labetalol (NORMODYNE) tablet 100 mg  100 mg Oral BID Colvin Caroli, PA-C   100 mg at 10/08/22 2232   loratadine (CLARITIN) tablet 10 mg  10 mg Oral Daily Beverely Low, MD   10 mg at 10/09/22 9528   melatonin tablet 10 mg  10 mg Oral Wendi Maya, MD   10 mg at 10/08/22 2216   menthol-cetylpyridinium (CEPACOL) lozenge 3 mg  1 lozenge Oral PRN Beverely Low, MD       Or   phenol (CHLORASEPTIC) mouth spray 1 spray  1 spray Mouth/Throat PRN Beverely Low, MD       methocarbamol (ROBAXIN) tablet 500 mg  500 mg Oral Q6H PRN Beverely Low, MD   500 mg at 10/07/22 2103   Or   methocarbamol (ROBAXIN) 500 mg in dextrose 5 % 50 mL IVPB  500 mg Intravenous Q6H PRN Beverely Low, MD   Stopped at 10/06/22 1544   metoCLOPramide (REGLAN) tablet 5-10 mg  5-10 mg Oral Q8H PRN Beverely Low, MD       Or   metoCLOPramide (REGLAN) injection 5-10 mg  5-10 mg Intravenous Q8H PRN Beverely Low, MD       morphine (PF) 2 MG/ML injection 0.5-1 mg  0.5-1 mg Intravenous Q2H PRN Beverely Low, MD       nitroGLYCERIN (NITROSTAT) SL tablet 0.4 mg  0.4 mg Sublingual Q5 min PRN Beverely Low, MD       ondansetron [facility]) tablet 4 mg  4 mg Oral Q6H PRN Beverely Low, MD       Or   ondansetron [facility]) injection 4 mg  4 mg Intravenous Q6H PRN Beverely Low, MD       pantoprazole (PROTONIX) EC tablet 40 mg  40 mg Oral Daily Beverely Low, MD   40 mg at 10/09/22 0831   pioglitazone (ACTOS) tablet 15 mg  15 mg Oral Daily Beverely Low, MD   15 mg at 10/09/22 4132   polyethylene glycol (MIRALAX / GLYCOLAX) packet 17 g  17 g Oral Daily PRN Beverely Low, MD         Discharge Medications: Please see discharge summary for a list of discharge medications.  Relevant Imaging Results:  Relevant Lab Results:   Additional Information SSN  [id number]  Amada Jupiter, LCSW

## 2022-10-09 NOTE — Discharge Summary (Signed)
In most cases prophylactic antibiotics for Dental procdeures after total joint surgery are not necessary.  Exceptions are as follows:  1. History of prior total joint infection  2. Severely immunocompromised (Organ Transplant, cancer chemotherapy, Rheumatoid biologic meds such as Humera)  3. Poorly controlled diabetes (A1C &gt; 8.0, blood glucose over 200)  If you have one of these conditions, contact your surgeon for an antibiotic prescription, prior to your dental procedure. Orthopedic Discharge Summary        Physician Discharge Summary  Patient ID: Whitney [name] MRN: [id number] DOB/AGE: [birth year] 77 y.o.  Admit date: 10/06/2022 Discharge date: 10/09/2022   Procedures:  Procedure(s) (LRB): TOTAL KNEE ARTHROPLASTY (Right)  Attending Physician:  Dr. Malon Kindle  Admission Diagnoses:   right knee end stage osteoarthritis  Discharge Diagnoses:  right knee end stage osteoarthritis   Past Medical History:  Diagnosis Date   Anxiety    Arthritis    Asthma    Cancer (HCC)    skin cancer- face-    Chronic kidney disease    CKD   Complication of anesthesia    Concussion    Depression    Diabetes mellitus without complication (HCC)    GERD (gastroesophageal reflux disease)    Hypertension    IBS (irritable bowel syndrome)    diarrhea   MRSA (methicillin resistant Staphylococcus aureus)    after skin cancer removed from face.   Neuromuscular disorder (HCC)    Neuropathy both feet   Neuropathy    Pneumonia    PONV (postoperative nausea and vomiting)    years ago   PTSD (post-traumatic stress disorder)     PCP: Marylen Ponto, MD   Discharged Condition: good  Hospital Course:  Patient underwent the above stated procedure on 10/06/2022. Patient tolerated the procedure well and brought to the recovery room in good condition and subsequently to the floor. Patient had an uncomplicated hospital course and was stable for discharge.   Disposition:  Discharge disposition: 03-Skilled Nursing Facility      with follow up in 2 weeks    Follow-up Information     Beverely Low, MD. Schedule an appointment as soon as possible for a visit in 2 week(s).   Specialty: Orthopedic Surgery Why: call [phone] Contact information: [address] [place] [address] [place] [address] [phone]         Beverely Low, MD. Call in 2 week(s).   Specialty: Orthopedic Surgery Why: call [phone] for appt Contact information: [address] [place] [address] [place] [address] [phone]                 Dental Antibiotics:  In most cases prophylactic antibiotics for Dental procdeures after total joint surgery are not necessary.  Exceptions are as follows:  1. History of prior total joint infection  2. Severely immunocompromised (Organ Transplant, cancer chemotherapy, Rheumatoid biologic meds such as Humera)  3. Poorly controlled diabetes (A1C &gt; 8.0, blood glucose over 200)  If you have one of these conditions, contact your surgeon for an antibiotic prescription, prior to your dental procedure.  Discharge Instructions     Call MD / Call 911   Complete by: As directed    If you experience chest pain or shortness of breath, CALL 911 and be transported to the hospital emergency room.  If you develope a fever above 101 F, pus (white drainage) or increased drainage or redness at the wound, or calf pain, call your surgeon's office.   Constipation Prevention  Complete by: As directed    Drink plenty of fluids.  Prune juice may be helpful.  You may use a stool softener, such as Colace (over the counter) 100 mg twice a day.  Use MiraLax (over the counter) for constipation as needed.   Diet - low sodium heart healthy   Complete by: As directed    Increase activity slowly as tolerated   Complete by: As directed    Post-operative opioid taper instructions:   Complete by: As directed    POST-OPERATIVE OPIOID TAPER  INSTRUCTIONS: It is important to wean off of your opioid medication as soon as possible. If you do not need pain medication after your surgery it is ok to stop day one. Opioids include: Codeine, Hydrocodone(Norco, Vicodin), Oxycodone(Percocet, oxycontin) and hydromorphone amongst others.  Long term and even short term use of opiods can cause: Increased pain response Dependence Constipation Depression Respiratory depression And more.  Withdrawal symptoms can include Flu like symptoms Nausea, vomiting And more Techniques to manage these symptoms Hydrate well Eat regular healthy meals Stay active Use relaxation techniques(deep breathing, meditating, yoga) Do Not substitute Alcohol to help with tapering If you have been on opioids for less than two weeks and do not have pain than it is ok to stop all together.  Plan to wean off of opioids This plan should start within one week post op of your joint replacement. Maintain the same interval or time between taking each dose and first decrease the dose.  Cut the total daily intake of opioids by one tablet each day Next start to increase the time between doses. The last dose that should be eliminated is the evening dose.          Allergies as of 10/09/2022       Reactions   Talwin [pentazocine] Nausea Only, Other (See Comments)   Confusion   Other    Blood Product Refusal    Codeine Nausea And Vomiting, Other (See Comments)   disorientation   Erythromycin Hives   Morphine And Codeine Rash   Blisters, pt states she can take this med in small doses    Percocet [oxycodone-acetaminophen] Itching        Medication List     TAKE these medications    acetaminophen 650 MG CR tablet Commonly known as: TYLENOL Take 650 mg by mouth every 8 (eight) hours as needed for pain.   albuterol 108 (90 Base) MCG/ACT inhaler Commonly known as: VENTOLIN HFA Inhale 2 puffs into the lungs every 6 (six) hours as needed for wheezing or  shortness of breath.   aspirin 81 MG chewable tablet Commonly known as: Aspirin Childrens Chew 1 tablet (81 mg total) by mouth 2 (two) times daily.   atorvastatin 40 MG tablet Commonly known as: LIPITOR Take 40 mg by mouth at bedtime.   buPROPion 150 MG 24 hr tablet Commonly known as: WELLBUTRIN XL Take 150 mg by mouth daily.   cetirizine 10 MG tablet Commonly known as: ZYRTEC Take 10 mg by mouth daily.   cholecalciferol 25 MCG (1000 UNIT) tablet Commonly known as: VITAMIN D3 Take 1,000 Units by mouth daily.   citalopram 40 MG tablet Commonly known as: CELEXA Take 40 mg by mouth daily.   clonazePAM 1 MG tablet Commonly known as: KLONOPIN Take 1 mg by mouth 2 (two) times daily.   Co Q-10 200 MG Caps Take 200 mg by mouth daily.   cyanocobalamin 1000 MCG tablet Commonly known as: VITAMIN B12 Take 1,000  mcg by mouth daily.   donepezil 5 MG tablet Commonly known as: ARICEPT Take 5 mg by mouth at bedtime.   furosemide 20 MG tablet Commonly known as: LASIX Take 20 mg by mouth daily.   gabapentin 300 MG capsule Commonly known as: NEURONTIN Take 300 mg by mouth in the morning and at bedtime.   HAIR/SKIN/NAILS PO Take 1 tablet by mouth daily.   HYDROcodone-acetaminophen 5-325 MG tablet Commonly known as: Norco Take 1-2 tablets by mouth every 4 (four) hours as needed for moderate pain or severe pain.   Melatonin 10 MG Tabs Take 10 mg by mouth at bedtime.   methocarbamol 500 MG tablet Commonly known as: Robaxin Take 1 tablet (500 mg total) by mouth every 8 (eight) hours as needed for muscle spasms.   nitroGLYCERIN 0.4 MG SL tablet Commonly known as: NITROSTAT Place 0.4 mg under the tongue every 5 (five) minutes as needed for chest pain.   OIL BASE EX Apply 1 application topically 4 (four) times daily as needed (for pain.). CBD OIL   olmesartan 40 MG tablet Commonly known as: BENICAR Take 40 mg by mouth daily.   omeprazole 40 MG capsule Commonly known as:  PRILOSEC Take 40 mg by mouth daily.   ondansetron 4 MG tablet Commonly known as: Zofran Take 1 tablet (4 mg total) by mouth every 8 (eight) hours as needed for nausea, vomiting or refractory nausea / vomiting. What changed: Another medication with the same name was added. Make sure you understand how and when to take each.   ondansetron 4 MG tablet Commonly known as: Zofran Take 1 tablet (4 mg total) by mouth every 8 (eight) hours as needed for vomiting, refractory nausea / vomiting or nausea. What changed: You were already taking a medication with the same name, and this prescription was added. Make sure you understand how and when to take each.   pioglitazone 15 MG tablet Commonly known as: ACTOS Take 15 mg by mouth daily.   Trulicity 3 MG/0.5ML Sopn Generic drug: Dulaglutide Inject 3 mg into the skin every Wednesday.          Signed: Thea Gist 10/09/2022, 9:42 AM  [facility] is now [employer] [address], [place], [place] [address] Phone: [phone] Facebook  Instagram  [employer]

## 2022-10-09 NOTE — TOC Progression Note (Signed)
Transition of Care [facility]) - Progression Note    Patient Details  Name: Whitney [name] MRN: [id number] Date of Birth: 10-Jul-1945  Transition of Care [facility]) CM/SW Contact  Amada Jupiter, LCSW Phone Number: 10/09/2022, 10:36 AM  Clinical Narrative:     Met with pt this morning to update on dc planning progress.  Have been able to confirm with [facility] that bed is ready for pt when we have insurance authorization.  Started auth this morning with [employer] and hopeful this may come through today.  Will keep pt/ MD/ team updated.  Expected Discharge Plan: Skilled Nursing Facility Barriers to Discharge: Continued Medical Work up  Expected Discharge Plan and Services In-house Referral: Clinical Social Work     Living arrangements for the past 2 months: Single Family Home Expected Discharge Date: 10/09/22               DME Arranged: N/A                     Social Determinants of Health (SDOH) Interventions SDOH Screenings   Food Insecurity: No Food Insecurity (10/06/2022)  Housing: Low Risk  (10/06/2022)  Transportation Needs: No Transportation Needs (10/06/2022)  Utilities: Not At Risk (10/06/2022)  Tobacco Use: Low Risk  (10/06/2022)    Readmission Risk Interventions     No data to display

## 2022-10-09 NOTE — Care Management Important Message (Signed)
Important Message  Patient Details IM Letter given. Name: Whitney [name] MRN: [id number] Date of Birth: 04-Jan-1946   Medicare Important Message Given:  Yes     [place], [place] 10/09/2022, 2:07 PM

## 2022-10-09 NOTE — TOC PASRR Note (Signed)
30 Day PASRR Note   Patient Details  Name: Whitney [name] Date of Birth: 1945/09/28   Transition [facility] Care [facility]) CM/SW Contact:    Amada Jupiter, LCSW Phone Number: 10/09/2022, 9:31 AM  To Whom It May Concern:  Please be advised that this patient will require a short-term nursing home stay - anticipated 30 days or less for rehabilitation and strengthening.   The plan is for return home.

## 2022-10-09 NOTE — Progress Notes (Signed)
Physical Therapy Treatment Patient Details Name: Whitney [name] MRN: [id number] DOB: 01-12-1946 Today's Date: 10/09/2022   History of Present Illness Pt s/p R TKR and with hx of CKD, neuropathy bil feet, PTSD, bil RCR, CVA and L TKR (9/23)    PT Comments  Pt continues to be lethargic, able to arouse and participate with incr time and effort; d/c plan remains appropriate.  Patient will benefit from continued  follow up therapy, <3 hours/day at d/c     If plan is discharge home, recommend the following: Assistance with cooking/housework;Assist for transportation;Help with stairs or ramp for entrance;A lot of help with walking and/or transfers;A lot of help with bathing/dressing/bathroom;Direct supervision/assist for medications management   Can travel by private vehicle     No  Equipment Recommendations  None recommended by PT    Recommendations for Other Services       Precautions / Restrictions Precautions Precautions: Fall;Knee Required Braces or Orthoses: Knee Immobilizer - Right Restrictions Weight Bearing Restrictions: No RLE Weight Bearing: Weight bearing as tolerated     Mobility  Bed Mobility Overal bed mobility: Needs Assistance Bed Mobility: Supine to Sit     Supine to sit: Min assist, Mod assist, HOB elevated     General bed mobility comments: cues for LE management and use of UEs to self assist; physical assist for R LE management, increased time, use of rails    Transfers Overall transfer level: Needs assistance Equipment used: Rolling walker (2 wheels) Transfers: Sit to/from Stand, Bed to chair/wheelchair/BSC Sit to Stand: Min assist, Mod assist   Step pivot transfers: Min assist, Mod assist, +2 safety/equipment, +2 physical assistance       General transfer comment: cues for LE management and use of UEs to self assist; physical assist to bring wt up and fwd and to balance in  standing with RW--assist throughout to avoid posterior LOB. stand  pivot x2 bed to bsc and bsc to chair, incr time and effort needed.    Ambulation/Gait                   Stairs             Wheelchair Mobility     Tilt Bed    Modified Rankin (Stroke Patients Only)       Balance Overall balance assessment: Needs assistance Sitting-balance support: Feet supported, Single extremity supported Sitting balance-Leahy Scale: Fair     Standing balance support: Bilateral upper extremity supported Standing balance-Leahy Scale: Poor Standing balance comment: reliant on device and external assist                            Cognition Arousal: Lethargic, Suspect due to medications Behavior During Therapy: WFL for tasks assessed/performed Overall Cognitive Status: Impaired/Different from baseline Area of Impairment: Safety/judgement, Following commands, Problem solving                       Following Commands: Follows one step commands with increased time, Follows multi-step commands inconsistently Safety/Judgement: Decreased awareness of safety   Problem Solving: Slow processing, Decreased initiation, Requires verbal cues, Difficulty sequencing          Exercises Total Joint Exercises Ankle Circles/Pumps: AROM, AAROM, Both, 10 reps    General Comments        Pertinent Vitals/Pain Pain Assessment Pain Assessment: 0-10 Pain Score: 4  Pain Location: R knee Pain Descriptors / Indicators: Aching, Sore Pain  Intervention(s): Limited activity within patient's tolerance, Monitored during session, Premedicated before session, Repositioned    Home Living                          Prior Function            PT Goals (current goals can now be found in the care plan section) Acute Rehab PT Goals Patient Stated Goal: To rehab to regain IND PT Goal Formulation: With patient Time For Goal Achievement: 10/07/22 Potential to Achieve Goals: Good Progress towards PT goals: Progressing toward goals     Frequency    7X/week      PT Plan      Co-evaluation              AM-PAC PT "6 Clicks" Mobility   Outcome Measure  Help needed turning from your back to your side while in a flat bed without using bedrails?: A Little Help needed moving from lying on your back to sitting on the side of a flat bed without using bedrails?: A Little Help needed moving to and from a bed to a chair (including a wheelchair)?: A Little Help needed standing up from a chair using your arms (e.g., wheelchair or bedside chair)?: A Lot Help needed to walk in hospital room?: A Lot Help needed climbing 3-5 steps with a railing? : A Lot 6 Click Score: 15    End of Session Equipment Utilized During Treatment: Gait belt;Right knee immobilizer Activity Tolerance: Patient tolerated treatment well;Patient limited by lethargy Patient left: with call bell/phone within reach;in chair;with chair alarm set Nurse Communication: Mobility status PT Visit Diagnosis: Difficulty in walking, not elsewhere classified (R26.2)     Time: 1610-9604 PT Time Calculation (min) (ACUTE ONLY): 29 min  Charges:    $Therapeutic Activity: 23-37 mins PT General Charges $$ ACUTE PT VISIT: 1 Visit                     Delice Bison, PT  Acute Rehab Dept [facility]) [phone]  10/09/2022    [facility] 10/09/2022, 11:57 AM

## 2022-10-10 NOTE — Plan of Care (Signed)
Problem: Activity: Goal: Ability to avoid complications of mobility impairment will improve Outcome: Progressing   Problem: Pain Management: Goal: Pain level will decrease with appropriate interventions Outcome: Progressing   Problem: Activity: Goal: Risk for activity intolerance will decrease Outcome: Progressing

## 2022-10-10 NOTE — Discharge Summary (Signed)
In most cases prophylactic antibiotics for Dental procdeures after total joint surgery are not necessary.  Exceptions are as follows:  1. History of prior total joint infection  2. Severely immunocompromised (Organ Transplant, cancer chemotherapy, Rheumatoid biologic meds such as Humera)  3. Poorly controlled diabetes (A1C &gt; 8.0, blood glucose over 200)  If you have one of these conditions, contact your surgeon for an antibiotic prescription, prior to your dental procedure. Orthopedic Discharge Summary        Physician Discharge Summary  Patient ID: Whitney [name] MRN: [id number] DOB/AGE: Apr 20, 1945 77 y.o.  Admit date: 10/06/2022 Discharge date: 10/10/2022   Procedures:  Procedure(s) (LRB): TOTAL KNEE ARTHROPLASTY (Right)  Attending Physician:  Dr. Malon Kindle  Admission Diagnoses:   right knee end stage osteoarthritis  Discharge Diagnoses:  right knee end stage osteoarthritis   Past Medical History:  Diagnosis Date   Anxiety    Arthritis    Asthma    Cancer (HCC)    skin cancer- face-    Chronic kidney disease    CKD   Complication of anesthesia    Concussion    Depression    Diabetes mellitus without complication (HCC)    GERD (gastroesophageal reflux disease)    Hypertension    IBS (irritable bowel syndrome)    diarrhea   MRSA (methicillin resistant Staphylococcus aureus)    after skin cancer removed from face.   Neuromuscular disorder (HCC)    Neuropathy both feet   Neuropathy    Pneumonia    PONV (postoperative nausea and vomiting)    years ago   PTSD (post-traumatic stress disorder)     PCP: Marylen Ponto, MD   Discharged Condition: good  Hospital Course:  Patient underwent the above stated procedure on 10/06/2022. Patient tolerated the procedure well and brought to the recovery room in good condition and subsequently to the floor. Patient had an uncomplicated hospital course and was stable for discharge.   Disposition:  Discharge disposition: 03-Skilled Nursing Facility      with follow up in 2 weeks    Follow-up Information     Beverely Low, MD. Schedule an appointment as soon as possible for a visit in 2 week(s).   Specialty: Orthopedic Surgery Why: call [phone] Contact information: [address] [place] [address] [place] [address] [phone]         Beverely Low, MD. Call in 2 week(s).   Specialty: Orthopedic Surgery Why: call [phone] for appt Contact information: [address] [place] [address] [place] [address] [phone]                 Dental Antibiotics:  In most cases prophylactic antibiotics for Dental procdeures after total joint surgery are not necessary.  Exceptions are as follows:  1. History of prior total joint infection  2. Severely immunocompromised (Organ Transplant, cancer chemotherapy, Rheumatoid biologic meds such as Humera)  3. Poorly controlled diabetes (A1C &gt; 8.0, blood glucose over 200)  If you have one of these conditions, contact your surgeon for an antibiotic prescription, prior to your dental procedure.  Discharge Instructions     Call MD / Call 911   Complete by: As directed    If you experience chest pain or shortness of breath, CALL 911 and be transported to the hospital emergency room.  If you develope a fever above 101 F, pus (white drainage) or increased drainage or redness at the wound, or calf pain, call your surgeon's office.   Call MD /  Call 911   Complete by: As directed    If you experience chest pain or shortness of breath, CALL 911 and be transported to the hospital emergency room.  If you develope a fever above 101 F, pus (white drainage) or increased drainage or redness at the wound, or calf pain, call your surgeon's office.   Constipation Prevention   Complete by: As directed    Drink plenty of fluids.  Prune juice may be helpful.  You may use a stool softener, such as Colace (over the counter) 100  mg twice a day.  Use MiraLax (over the counter) for constipation as needed.   Constipation Prevention   Complete by: As directed    Drink plenty of fluids.  Prune juice may be helpful.  You may use a stool softener, such as Colace (over the counter) 100 mg twice a day.  Use MiraLax (over the counter) for constipation as needed.   Diet - low sodium heart healthy   Complete by: As directed    Diet - low sodium heart healthy   Complete by: As directed    Increase activity slowly as tolerated   Complete by: As directed    Increase activity slowly as tolerated   Complete by: As directed    Post-operative opioid taper instructions:   Complete by: As directed    POST-OPERATIVE OPIOID TAPER INSTRUCTIONS: It is important to wean off of your opioid medication as soon as possible. If you do not need pain medication after your surgery it is ok to stop day one. Opioids include: Codeine, Hydrocodone(Norco, Vicodin), Oxycodone(Percocet, oxycontin) and hydromorphone amongst others.  Long term and even short term use of opiods can cause: Increased pain response Dependence Constipation Depression Respiratory depression And more.  Withdrawal symptoms can include Flu like symptoms Nausea, vomiting And more Techniques to manage these symptoms Hydrate well Eat regular healthy meals Stay active Use relaxation techniques(deep breathing, meditating, yoga) Do Not substitute Alcohol to help with tapering If you have been on opioids for less than two weeks and do not have pain than it is ok to stop all together.  Plan to wean off of opioids This plan should start within one week post op of your joint replacement. Maintain the same interval or time between taking each dose and first decrease the dose.  Cut the total daily intake of opioids by one tablet each day Next start to increase the time between doses. The last dose that should be eliminated is the evening dose.      Post-operative opioid taper  instructions:   Complete by: As directed    POST-OPERATIVE OPIOID TAPER INSTRUCTIONS: It is important to wean off of your opioid medication as soon as possible. If you do not need pain medication after your surgery it is ok to stop day one. Opioids include: Codeine, Hydrocodone(Norco, Vicodin), Oxycodone(Percocet, oxycontin) and hydromorphone amongst others.  Long term and even short term use of opiods can cause: Increased pain response Dependence Constipation Depression Respiratory depression And more.  Withdrawal symptoms can include Flu like symptoms Nausea, vomiting And more Techniques to manage these symptoms Hydrate well Eat regular healthy meals Stay active Use relaxation techniques(deep breathing, meditating, yoga) Do Not substitute Alcohol to help with tapering If you have been on opioids for less than two weeks and do not have pain than it is ok to stop all together.  Plan to wean off of opioids This plan should start within one week post op of your joint  replacement. Maintain the same interval or time between taking each dose and first decrease the dose.  Cut the total daily intake of opioids by one tablet each day Next start to increase the time between doses. The last dose that should be eliminated is the evening dose.          Allergies as of 10/10/2022       Reactions   Talwin [pentazocine] Nausea Only, Other (See Comments)   Confusion   Other    Blood Product Refusal    Codeine Nausea And Vomiting, Other (See Comments)   disorientation   Erythromycin Hives   Morphine And Codeine Rash   Blisters, pt states she can take this med in small doses    Percocet [oxycodone-acetaminophen] Itching        Medication List     TAKE these medications    acetaminophen 650 MG CR tablet Commonly known as: TYLENOL Take 650 mg by mouth every 8 (eight) hours as needed for pain.   albuterol 108 (90 Base) MCG/ACT inhaler Commonly known as: VENTOLIN HFA Inhale 2  puffs into the lungs every 6 (six) hours as needed for wheezing or shortness of breath.   aspirin 81 MG chewable tablet Commonly known as: Aspirin Childrens Chew 1 tablet (81 mg total) by mouth 2 (two) times daily.   atorvastatin 40 MG tablet Commonly known as: LIPITOR Take 40 mg by mouth at bedtime.   buPROPion 150 MG 24 hr tablet Commonly known as: WELLBUTRIN XL Take 150 mg by mouth daily.   cetirizine 10 MG tablet Commonly known as: ZYRTEC Take 10 mg by mouth daily.   cholecalciferol 25 MCG (1000 UNIT) tablet Commonly known as: VITAMIN D3 Take 1,000 Units by mouth daily.   citalopram 40 MG tablet Commonly known as: CELEXA Take 40 mg by mouth daily.   clonazePAM 1 MG tablet Commonly known as: KLONOPIN Take 1 mg by mouth 2 (two) times daily.   Co Q-10 200 MG Caps Take 200 mg by mouth daily.   cyanocobalamin 1000 MCG tablet Commonly known as: VITAMIN B12 Take 1,000 mcg by mouth daily.   donepezil 5 MG tablet Commonly known as: ARICEPT Take 5 mg by mouth at bedtime.   furosemide 20 MG tablet Commonly known as: LASIX Take 20 mg by mouth daily.   gabapentin 300 MG capsule Commonly known as: NEURONTIN Take 300 mg by mouth in the morning and at bedtime.   HAIR/SKIN/NAILS PO Take 1 tablet by mouth daily.   HYDROcodone-acetaminophen 5-325 MG tablet Commonly known as: Norco Take 1-2 tablets by mouth every 4 (four) hours as needed for moderate pain or severe pain.   Melatonin 10 MG Tabs Take 10 mg by mouth at bedtime.   methocarbamol 500 MG tablet Commonly known as: Robaxin Take 1 tablet (500 mg total) by mouth every 8 (eight) hours as needed for muscle spasms.   nitroGLYCERIN 0.4 MG SL tablet Commonly known as: NITROSTAT Place 0.4 mg under the tongue every 5 (five) minutes as needed for chest pain.   OIL BASE EX Apply 1 application topically 4 (four) times daily as needed (for pain.). CBD OIL   olmesartan 40 MG tablet Commonly known as: BENICAR Take 40  mg by mouth daily.   omeprazole 40 MG capsule Commonly known as: PRILOSEC Take 40 mg by mouth daily.   ondansetron 4 MG tablet Commonly known as: Zofran Take 1 tablet (4 mg total) by mouth every 8 (eight) hours as needed for nausea, vomiting  or refractory nausea / vomiting. What changed: Another medication with the same name was added. Make sure you understand how and when to take each.   ondansetron 4 MG tablet Commonly known as: Zofran Take 1 tablet (4 mg total) by mouth every 8 (eight) hours as needed for vomiting, refractory nausea / vomiting or nausea. What changed: You were already taking a medication with the same name, and this prescription was added. Make sure you understand how and when to take each.   pioglitazone 15 MG tablet Commonly known as: ACTOS Take 15 mg by mouth daily.   Trulicity 3 MG/0.5ML Sopn Generic drug: Dulaglutide Inject 3 mg into the skin every Wednesday.          Signed: Thea Gist 10/10/2022, 1:56 PM  [facility] is now [facility] [address], [place], [place] [address] Phone: [phone] Facebook  Instagram  [employer]

## 2022-10-10 NOTE — Plan of Care (Signed)
Problem: Education: Goal: Knowledge of the prescribed therapeutic regimen will improve Outcome: Progressing   Problem: Activity: Goal: Range of joint motion will improve Outcome: Progressing   Problem: Pain Management: Goal: Pain level will decrease with appropriate interventions Outcome: Progressing   Problem: Safety: Goal: Ability to remain free from injury will improve Outcome: Progressing

## 2022-10-10 NOTE — Progress Notes (Signed)
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 D/c plan remains appropriate    If plan is discharge home, recommend the following: Assistance with cooking/housework;Assist for transportation;Help with stairs or ramp for entrance;A lot of help with walking and/or transfers;A lot of help with bathing/dressing/bathroom;Direct supervision/assist for medications management   Can travel by private vehicle     No  Equipment Recommendations  None recommended by PT    Recommendations for Other Services       Precautions / Restrictions Precautions Precautions: Fall;Knee Required Braces or Orthoses: Knee Immobilizer - Right Restrictions Weight Bearing Restrictions: No RLE Weight Bearing: Weight bearing as tolerated     Mobility  Bed Mobility Overal bed mobility: Needs Assistance Bed Mobility: Supine to Sit     Supine to sit: Min assist, HOB elevated, Used rails     General bed mobility comments: cues for LE management and use of UEs to self assist; physical assist for R LE management, increased time, use of rails    Transfers Overall transfer level: Needs assistance Equipment used: Rolling walker (2 wheels) Transfers: Sit to/from Stand, Bed to chair/wheelchair/BSC Sit to Stand: Min assist, +2 safety/equipment   Step pivot transfers: Min assist, +2 safety/equipment       General transfer comment: cues for LE management and use of UEs to self assist; physical assist to bring wt up and fwd and to balance in  standing with RW--intermittent assist  to avoid posterior LOB.    Ambulation/Gait Ambulation/Gait assistance: Min assist, +2 safety/equipment Gait Distance (Feet): 55  Feet Assistive device: Rolling walker (2 wheels) Gait Pattern/deviations: Step-to pattern, Step-through pattern       General Gait Details: cues for sequence, posture and position from RW; light  assist for balance and RW management; no overt LOB   Stairs             Wheelchair Mobility     Tilt Bed    Modified Rankin (Stroke Patients Only)       Balance Overall balance assessment: Needs assistance Sitting-balance support: Feet supported, Single extremity supported Sitting balance-Leahy Scale: Fair     Standing balance support: Bilateral upper extremity supported Standing balance-Leahy Scale: Poor Standing balance comment: reliant on device and external assist                            Cognition Arousal: Alert Behavior During Therapy: WFL for tasks assessed/performed Overall Cognitive Status: Within Functional Limits for tasks assessed (much improved mentation) Area of Impairment: Following commands, Problem solving                       Following Commands: Follows one step commands with increased time, Follows multi-step commands with increased time Safety/Judgement: Decreased awareness of safety   Problem Solving: Slow processing, Decreased initiation, Difficulty sequencing, Requires verbal cues, Requires tactile cues          Exercises Total Joint Exercises Ankle Circles/Pumps: AROM, AAROM, Both, 10 reps Heel Slides: Limitations Heel Slides Limitations: began heel slides, pt then requested BSC, KI left off to encourage flexion    General Comments  Pertinent Vitals/Pain Pain Assessment Pain Assessment: 0-10 Pain Score: 4  Pain Location: R knee Pain Descriptors / Indicators: Aching, Sore Pain Intervention(s): Limited activity within patient's tolerance, Monitored during session, Premedicated before session, Repositioned    Home Living                          Prior Function            PT Goals  (current goals can now be found in the care plan section) Acute Rehab PT Goals Patient Stated Goal: To rehab to regain IND PT Goal Formulation: With patient Time For Goal Achievement: 10/07/22 Potential to Achieve Goals: Good Progress towards PT goals: Progressing toward goals    Frequency    7X/week      PT Plan      Co-evaluation              AM-PAC PT "6 Clicks" Mobility   Outcome Measure  Help needed turning from your back to your side while in a flat bed without using bedrails?: A Little Help needed moving from lying on your back to sitting on the side of a flat bed without using bedrails?: A Little Help needed moving to and from a bed to a chair (including a wheelchair)?: A Little Help needed standing up from a chair using your arms (e.g., wheelchair or bedside chair)?: A Little Help needed to walk in hospital room?: A Little Help needed climbing 3-5 steps with a railing? : A Lot 6 Click Score: 17    End of Session Equipment Utilized During Treatment: Gait belt;Right knee immobilizer Activity Tolerance: Patient tolerated treatment well Patient left: with call bell/phone within reach;Other (comment) (BSC--NT aware) Nurse Communication: Mobility status PT Visit Diagnosis: Difficulty in walking, not elsewhere classified (R26.2)     Time: 1610-9604 PT Time Calculation (min) (ACUTE ONLY): 21 min  Charges:    $Gait Training: 8-22 mins PT General Charges $$ ACUTE PT VISIT: 1 Visit                     Kinsley Nicklaus, PT  Acute Rehab Dept [facility]) [phone]  10/10/2022    [facility] 10/10/2022, 12:42 PM

## 2022-10-11 DIAGNOSIS — M6281 Muscle weakness (generalized): Secondary | ICD-10-CM | POA: Diagnosis not present

## 2022-10-11 DIAGNOSIS — F5105 Insomnia due to other mental disorder: Secondary | ICD-10-CM | POA: Diagnosis not present

## 2022-10-11 DIAGNOSIS — M1711 Unilateral primary osteoarthritis, right knee: Secondary | ICD-10-CM | POA: Diagnosis not present

## 2022-10-11 DIAGNOSIS — F413 Other mixed anxiety disorders: Secondary | ICD-10-CM | POA: Diagnosis not present

## 2022-10-11 DIAGNOSIS — F29 Unspecified psychosis not due to a substance or known physiological condition: Secondary | ICD-10-CM | POA: Diagnosis not present

## 2022-10-11 DIAGNOSIS — Z7401 Bed confinement status: Secondary | ICD-10-CM | POA: Diagnosis not present

## 2022-10-11 DIAGNOSIS — K219 Gastro-esophageal reflux disease without esophagitis: Secondary | ICD-10-CM | POA: Diagnosis not present

## 2022-10-11 DIAGNOSIS — F4320 Adjustment disorder, unspecified: Secondary | ICD-10-CM | POA: Diagnosis not present

## 2022-10-11 DIAGNOSIS — Z96651 Presence of right artificial knee joint: Secondary | ICD-10-CM | POA: Diagnosis not present

## 2022-10-11 DIAGNOSIS — F329 Major depressive disorder, single episode, unspecified: Secondary | ICD-10-CM | POA: Diagnosis not present

## 2022-10-11 DIAGNOSIS — G8918 Other acute postprocedural pain: Secondary | ICD-10-CM | POA: Diagnosis not present

## 2022-10-11 DIAGNOSIS — R2689 Other abnormalities of gait and mobility: Secondary | ICD-10-CM | POA: Diagnosis not present

## 2022-10-11 DIAGNOSIS — F32A Depression, unspecified: Secondary | ICD-10-CM | POA: Diagnosis not present

## 2022-10-11 DIAGNOSIS — N183 Chronic kidney disease, stage 3 unspecified: Secondary | ICD-10-CM | POA: Diagnosis not present

## 2022-10-11 DIAGNOSIS — D8481 Immunodeficiency due to conditions classified elsewhere: Secondary | ICD-10-CM | POA: Diagnosis not present

## 2022-10-11 DIAGNOSIS — K589 Irritable bowel syndrome without diarrhea: Secondary | ICD-10-CM | POA: Diagnosis not present

## 2022-10-11 DIAGNOSIS — R262 Difficulty in walking, not elsewhere classified: Secondary | ICD-10-CM | POA: Diagnosis not present

## 2022-10-11 DIAGNOSIS — F419 Anxiety disorder, unspecified: Secondary | ICD-10-CM | POA: Diagnosis not present

## 2022-10-11 DIAGNOSIS — M810 Age-related osteoporosis without current pathological fracture: Secondary | ICD-10-CM | POA: Diagnosis not present

## 2022-10-11 DIAGNOSIS — E1151 Type 2 diabetes mellitus with diabetic peripheral angiopathy without gangrene: Secondary | ICD-10-CM | POA: Diagnosis not present

## 2022-10-11 DIAGNOSIS — E119 Type 2 diabetes mellitus without complications: Secondary | ICD-10-CM | POA: Diagnosis not present

## 2022-10-11 DIAGNOSIS — J45909 Unspecified asthma, uncomplicated: Secondary | ICD-10-CM | POA: Diagnosis not present

## 2022-10-11 DIAGNOSIS — Z471 Aftercare following joint replacement surgery: Secondary | ICD-10-CM | POA: Diagnosis not present

## 2022-10-11 DIAGNOSIS — E1140 Type 2 diabetes mellitus with diabetic neuropathy, unspecified: Secondary | ICD-10-CM | POA: Diagnosis not present

## 2022-10-11 DIAGNOSIS — I10 Essential (primary) hypertension: Secondary | ICD-10-CM | POA: Diagnosis not present

## 2022-10-11 DIAGNOSIS — R2681 Unsteadiness on feet: Secondary | ICD-10-CM | POA: Diagnosis not present

## 2022-10-11 DIAGNOSIS — G5783 Other specified mononeuropathies of bilateral lower limbs: Secondary | ICD-10-CM | POA: Diagnosis not present

## 2022-10-11 NOTE — Discharge Summary (Signed)
In most cases prophylactic antibiotics for Dental procdeures after total joint surgery are not necessary.  Exceptions are as follows:  1. History of prior total joint infection  2. Severely immunocompromised (Organ Transplant, cancer chemotherapy, Rheumatoid biologic meds such as Humera)  3. Poorly controlled diabetes (A1C &gt; 8.0, blood glucose over 200)  If you have one of these conditions, contact your surgeon for an antibiotic prescription, prior to your dental procedure. Orthopedic Discharge Summary        Physician Discharge Summary  Patient ID: Whitney [name] MRN: [id number] DOB/AGE: 03/31/1945 77 y.o.  Admit date: 10/06/2022 Discharge date: 10/11/2022   Procedures:  Procedure(s) (LRB): TOTAL KNEE ARTHROPLASTY (Right)  Attending Physician:  Dr. Malon Kindle  Admission Diagnoses:   right knee end stage osteoarthritis  Discharge Diagnoses:  right knee end stage osteoarthritis   Past Medical History:  Diagnosis Date   Anxiety    Arthritis    Asthma    Cancer (HCC)    skin cancer- face-    Chronic kidney disease    CKD   Complication of anesthesia    Concussion    Depression    Diabetes mellitus without complication (HCC)    GERD (gastroesophageal reflux disease)    Hypertension    IBS (irritable bowel syndrome)    diarrhea   MRSA (methicillin resistant Staphylococcus aureus)    after skin cancer removed from face.   Neuromuscular disorder (HCC)    Neuropathy both feet   Neuropathy    Pneumonia    PONV (postoperative nausea and vomiting)    years ago   PTSD (post-traumatic stress disorder)     PCP: Marylen Ponto, MD   Discharged Condition: good  Hospital Course:  Patient underwent the above stated procedure on 10/06/2022. Patient tolerated the procedure well and brought to the recovery room in good condition and subsequently to the floor. Patient had an uncomplicated hospital course and was stable for discharge.   Disposition:  Discharge disposition: 03-Skilled Nursing Facility      with follow up in 2 weeks    Follow-up Information     Beverely Low, MD. Schedule an appointment as soon as possible for a visit in 2 week(s).   Specialty: Orthopedic Surgery Why: call [phone] Contact information: [address] [place] [address] [place] [address] [phone]         Beverely Low, MD. Call in 2 week(s).   Specialty: Orthopedic Surgery Why: call [phone] for appt Contact information: [address] [place] [address] [place] [address] [phone]                 Dental Antibiotics:  In most cases prophylactic antibiotics for Dental procdeures after total joint surgery are not necessary.  Exceptions are as follows:  1. History of prior total joint infection  2. Severely immunocompromised (Organ Transplant, cancer chemotherapy, Rheumatoid biologic meds such as Humera)  3. Poorly controlled diabetes (A1C &gt; 8.0, blood glucose over 200)  If you have one of these conditions, contact your surgeon for an antibiotic prescription, prior to your dental procedure.  Discharge Instructions     Call MD / Call 911   Complete by: As directed    If you experience chest pain or shortness of breath, CALL 911 and be transported to the hospital emergency room.  If you develope a fever above 101 F, pus (white drainage) or increased drainage or redness at the wound, or calf pain, call your surgeon's office.   Call MD /  Call 911   Complete by: As directed    If you experience chest pain or shortness of breath, CALL 911 and be transported to the hospital emergency room.  If you develope a fever above 101 F, pus (white drainage) or increased drainage or redness at the wound, or calf pain, call your surgeon's office.   Call MD / Call 911   Complete by: As directed    If you experience chest pain or shortness of breath, CALL 911 and be transported to the hospital emergency room.  If you  develope a fever above 101 F, pus (white drainage) or increased drainage or redness at the wound, or calf pain, call your surgeon's office.   Constipation Prevention   Complete by: As directed    Drink plenty of fluids.  Prune juice may be helpful.  You may use a stool softener, such as Colace (over the counter) 100 mg twice a day.  Use MiraLax (over the counter) for constipation as needed.   Constipation Prevention   Complete by: As directed    Drink plenty of fluids.  Prune juice may be helpful.  You may use a stool softener, such as Colace (over the counter) 100 mg twice a day.  Use MiraLax (over the counter) for constipation as needed.   Constipation Prevention   Complete by: As directed    Drink plenty of fluids.  Prune juice may be helpful.  You may use a stool softener, such as Colace (over the counter) 100 mg twice a day.  Use MiraLax (over the counter) for constipation as needed.   Diet - low sodium heart healthy   Complete by: As directed    Diet - low sodium heart healthy   Complete by: As directed    Diet - low sodium heart healthy   Complete by: As directed    Increase activity slowly as tolerated   Complete by: As directed    Increase activity slowly as tolerated   Complete by: As directed    Increase activity slowly as tolerated   Complete by: As directed    Post-operative opioid taper instructions:   Complete by: As directed    POST-OPERATIVE OPIOID TAPER INSTRUCTIONS: It is important to wean off of your opioid medication as soon as possible. If you do not need pain medication after your surgery it is ok to stop day one. Opioids include: Codeine, Hydrocodone(Norco, Vicodin), Oxycodone(Percocet, oxycontin) and hydromorphone amongst others.  Long term and even short term use of opiods can cause: Increased pain response Dependence Constipation Depression Respiratory depression And more.  Withdrawal symptoms can include Flu like symptoms Nausea, vomiting And  more Techniques to manage these symptoms Hydrate well Eat regular healthy meals Stay active Use relaxation techniques(deep breathing, meditating, yoga) Do Not substitute Alcohol to help with tapering If you have been on opioids for less than two weeks and do not have pain than it is ok to stop all together.  Plan to wean off of opioids This plan should start within one week post op of your joint replacement. Maintain the same interval or time between taking each dose and first decrease the dose.  Cut the total daily intake of opioids by one tablet each day Next start to increase the time between doses. The last dose that should be eliminated is the evening dose.      Post-operative opioid taper instructions:   Complete by: As directed    POST-OPERATIVE OPIOID TAPER INSTRUCTIONS: It is important to  wean off of your opioid medication as soon as possible. If you do not need pain medication after your surgery it is ok to stop day one. Opioids include: Codeine, Hydrocodone(Norco, Vicodin), Oxycodone(Percocet, oxycontin) and hydromorphone amongst others.  Long term and even short term use of opiods can cause: Increased pain response Dependence Constipation Depression Respiratory depression And more.  Withdrawal symptoms can include Flu like symptoms Nausea, vomiting And more Techniques to manage these symptoms Hydrate well Eat regular healthy meals Stay active Use relaxation techniques(deep breathing, meditating, yoga) Do Not substitute Alcohol to help with tapering If you have been on opioids for less than two weeks and do not have pain than it is ok to stop all together.  Plan to wean off of opioids This plan should start within one week post op of your joint replacement. Maintain the same interval or time between taking each dose and first decrease the dose.  Cut the total daily intake of opioids by one tablet each day Next start to increase the time between doses. The last  dose that should be eliminated is the evening dose.      Post-operative opioid taper instructions:   Complete by: As directed    POST-OPERATIVE OPIOID TAPER INSTRUCTIONS: It is important to wean off of your opioid medication as soon as possible. If you do not need pain medication after your surgery it is ok to stop day one. Opioids include: Codeine, Hydrocodone(Norco, Vicodin), Oxycodone(Percocet, oxycontin) and hydromorphone amongst others.  Long term and even short term use of opiods can cause: Increased pain response Dependence Constipation Depression Respiratory depression And more.  Withdrawal symptoms can include Flu like symptoms Nausea, vomiting And more Techniques to manage these symptoms Hydrate well Eat regular healthy meals Stay active Use relaxation techniques(deep breathing, meditating, yoga) Do Not substitute Alcohol to help with tapering If you have been on opioids for less than two weeks and do not have pain than it is ok to stop all together.  Plan to wean off of opioids This plan should start within one week post op of your joint replacement. Maintain the same interval or time between taking each dose and first decrease the dose.  Cut the total daily intake of opioids by one tablet each day Next start to increase the time between doses. The last dose that should be eliminated is the evening dose.          Allergies as of 10/11/2022       Reactions   Talwin [pentazocine] Nausea Only, Other (See Comments)   Confusion   Other    Blood Product Refusal    Codeine Nausea And Vomiting, Other (See Comments)   disorientation   Erythromycin Hives   Morphine And Codeine Rash   Blisters, pt states she can take this med in small doses    Percocet [oxycodone-acetaminophen] Itching        Medication List     TAKE these medications    acetaminophen 650 MG CR tablet Commonly known as: TYLENOL Take 650 mg by mouth every 8 (eight) hours as needed for  pain.   albuterol 108 (90 Base) MCG/ACT inhaler Commonly known as: VENTOLIN HFA Inhale 2 puffs into the lungs every 6 (six) hours as needed for wheezing or shortness of breath.   aspirin 81 MG chewable tablet Commonly known as: Aspirin Childrens Chew 1 tablet (81 mg total) by mouth 2 (two) times daily.   atorvastatin 40 MG tablet Commonly known as: LIPITOR Take 40 mg  by mouth at bedtime.   buPROPion 150 MG 24 hr tablet Commonly known as: WELLBUTRIN XL Take 150 mg by mouth daily.   cetirizine 10 MG tablet Commonly known as: ZYRTEC Take 10 mg by mouth daily.   cholecalciferol 25 MCG (1000 UNIT) tablet Commonly known as: VITAMIN D3 Take 1,000 Units by mouth daily.   citalopram 40 MG tablet Commonly known as: CELEXA Take 40 mg by mouth daily.   clonazePAM 1 MG tablet Commonly known as: KLONOPIN Take 1 mg by mouth 2 (two) times daily.   Co Q-10 200 MG Caps Take 200 mg by mouth daily.   cyanocobalamin 1000 MCG tablet Commonly known as: VITAMIN B12 Take 1,000 mcg by mouth daily.   donepezil 5 MG tablet Commonly known as: ARICEPT Take 5 mg by mouth at bedtime.   furosemide 20 MG tablet Commonly known as: LASIX Take 20 mg by mouth daily.   gabapentin 300 MG capsule Commonly known as: NEURONTIN Take 300 mg by mouth in the morning and at bedtime.   HAIR/SKIN/NAILS PO Take 1 tablet by mouth daily.   HYDROcodone-acetaminophen 5-325 MG tablet Commonly known as: Norco Take 1-2 tablets by mouth every 4 (four) hours as needed for moderate pain or severe pain.   Melatonin 10 MG Tabs Take 10 mg by mouth at bedtime.   methocarbamol 500 MG tablet Commonly known as: Robaxin Take 1 tablet (500 mg total) by mouth every 8 (eight) hours as needed for muscle spasms.   nitroGLYCERIN 0.4 MG SL tablet Commonly known as: NITROSTAT Place 0.4 mg under the tongue every 5 (five) minutes as needed for chest pain.   OIL BASE EX Apply 1 application topically 4 (four) times daily as  needed (for pain.). CBD OIL   olmesartan 40 MG tablet Commonly known as: BENICAR Take 40 mg by mouth daily.   omeprazole 40 MG capsule Commonly known as: PRILOSEC Take 40 mg by mouth daily.   ondansetron 4 MG tablet Commonly known as: Zofran Take 1 tablet (4 mg total) by mouth every 8 (eight) hours as needed for nausea, vomiting or refractory nausea / vomiting. What changed: Another medication with the same name was added. Make sure you understand how and when to take each.   ondansetron 4 MG tablet Commonly known as: Zofran Take 1 tablet (4 mg total) by mouth every 8 (eight) hours as needed for vomiting, refractory nausea / vomiting or nausea. What changed: You were already taking a medication with the same name, and this prescription was added. Make sure you understand how and when to take each.   pioglitazone 15 MG tablet Commonly known as: ACTOS Take 15 mg by mouth daily.   Trulicity 3 MG/0.5ML Sopn Generic drug: Dulaglutide Inject 3 mg into the skin every Wednesday.          Signed: Thea Gist 10/11/2022, 9:17 AM  [facility] is now [employer] [address], [address], [place], [place] [address] Phone: [phone] Facebook  Instagram  [employer]

## 2022-10-11 NOTE — TOC Transition Note (Signed)
Transition of Care [facility]) - CM/SW Discharge Note   Patient Details  Name: Whitney [name] MRN: [id number] Date of Birth: 20-Dec-1945  [facility] CM/SW Contact:  Amada Jupiter, LCSW Phone Number: 10/11/2022, 10:19 AM   Clinical Narrative:     Have received insurance authorization for SNF at [facility] and for [facility] transport.  Pt medically cleared for dc.  Pt aware and agreeable and VM left for pt's son regard her move today.  [facility] called at 10:15am.  RN to call report to [phone] ext. [phone].  No further TOC needs.  Final next level of care: Skilled Nursing Facility Barriers to Discharge: Barriers Resolved   Patient Goals and CMS Choice      Discharge Placement PASRR number recieved: 10/09/22 PASRR number recieved: 10/09/22            Patient chooses bed at: [facility] Patient to be transferred to facility by: [facility] Name of family member notified: son Patient and family notified of of transfer: 10/11/22  Discharge Plan and Services Additional resources added to the After Visit Summary for   In-house Referral: Clinical Social Work              DME Arranged: N/A                    Social Determinants of Health (SDOH) Interventions SDOH Screenings   Food Insecurity: No Food Insecurity (10/06/2022)  Housing: Low Risk  (10/06/2022)  Transportation Needs: No Transportation Needs (10/06/2022)  Utilities: Not At Risk (10/06/2022)  Tobacco Use: Low Risk  (10/06/2022)     Readmission Risk Interventions    10/11/2022   10:19 AM  Readmission Risk Prevention Plan  Post Dischage Appt Complete  Medication Screening Complete  Transportation Screening Complete

## 2022-10-11 NOTE — Progress Notes (Addendum)
10:25 Called facility and gave report to [facility] LPN. All questions answered.   Pt is resting in bed, on room air, not in any distress. Pt is to discharge to SNF with all of her belongings via [facility].  10:55 Pt left the unit.

## 2022-10-11 NOTE — Plan of Care (Signed)
Problem: Education: Goal: Knowledge of the prescribed therapeutic regimen will improve Outcome: Progressing   Problem: Activity: Goal: Range of joint motion will improve Outcome: Progressing   Problem: Pain Management: Goal: Pain level will decrease with appropriate interventions Outcome: Progressing   Problem: Safety: Goal: Ability to remain free from injury will improve Outcome: Progressing

## 2022-10-11 NOTE — Plan of Care (Signed)

## 2022-10-13 DIAGNOSIS — G8918 Other acute postprocedural pain: Secondary | ICD-10-CM | POA: Diagnosis not present

## 2022-10-13 DIAGNOSIS — E1151 Type 2 diabetes mellitus with diabetic peripheral angiopathy without gangrene: Secondary | ICD-10-CM | POA: Diagnosis not present

## 2022-10-13 DIAGNOSIS — Z96651 Presence of right artificial knee joint: Secondary | ICD-10-CM | POA: Diagnosis not present

## 2022-10-13 DIAGNOSIS — R262 Difficulty in walking, not elsewhere classified: Secondary | ICD-10-CM | POA: Diagnosis not present

## 2022-10-26 DIAGNOSIS — F32A Depression, unspecified: Secondary | ICD-10-CM | POA: Diagnosis not present

## 2022-10-26 DIAGNOSIS — F4320 Adjustment disorder, unspecified: Secondary | ICD-10-CM | POA: Diagnosis not present

## 2022-10-26 DIAGNOSIS — F5105 Insomnia due to other mental disorder: Secondary | ICD-10-CM | POA: Diagnosis not present

## 2022-10-26 DIAGNOSIS — F413 Other mixed anxiety disorders: Secondary | ICD-10-CM | POA: Diagnosis not present

## 2022-11-01 DIAGNOSIS — Z96651 Presence of right artificial knee joint: Secondary | ICD-10-CM | POA: Diagnosis not present

## 2022-11-01 DIAGNOSIS — M519 Unspecified thoracic, thoracolumbar and lumbosacral intervertebral disc disorder: Secondary | ICD-10-CM | POA: Diagnosis not present

## 2022-11-01 DIAGNOSIS — E1151 Type 2 diabetes mellitus with diabetic peripheral angiopathy without gangrene: Secondary | ICD-10-CM | POA: Diagnosis not present

## 2022-11-01 DIAGNOSIS — Z7982 Long term (current) use of aspirin: Secondary | ICD-10-CM | POA: Diagnosis not present

## 2022-11-01 DIAGNOSIS — E1122 Type 2 diabetes mellitus with diabetic chronic kidney disease: Secondary | ICD-10-CM | POA: Diagnosis not present

## 2022-11-01 DIAGNOSIS — J45909 Unspecified asthma, uncomplicated: Secondary | ICD-10-CM | POA: Diagnosis not present

## 2022-11-01 DIAGNOSIS — K219 Gastro-esophageal reflux disease without esophagitis: Secondary | ICD-10-CM | POA: Diagnosis not present

## 2022-11-01 DIAGNOSIS — E1141 Type 2 diabetes mellitus with diabetic mononeuropathy: Secondary | ICD-10-CM | POA: Diagnosis not present

## 2022-11-01 DIAGNOSIS — Z7985 Long-term (current) use of injectable non-insulin antidiabetic drugs: Secondary | ICD-10-CM | POA: Diagnosis not present

## 2022-11-01 DIAGNOSIS — Z471 Aftercare following joint replacement surgery: Secondary | ICD-10-CM | POA: Diagnosis not present

## 2022-11-01 DIAGNOSIS — F329 Major depressive disorder, single episode, unspecified: Secondary | ICD-10-CM | POA: Diagnosis not present

## 2022-11-01 DIAGNOSIS — K589 Irritable bowel syndrome without diarrhea: Secondary | ICD-10-CM | POA: Diagnosis not present

## 2022-11-01 DIAGNOSIS — G709 Myoneural disorder, unspecified: Secondary | ICD-10-CM | POA: Diagnosis not present

## 2022-11-01 DIAGNOSIS — Z794 Long term (current) use of insulin: Secondary | ICD-10-CM | POA: Diagnosis not present

## 2022-11-01 DIAGNOSIS — N183 Chronic kidney disease, stage 3 unspecified: Secondary | ICD-10-CM | POA: Diagnosis not present

## 2022-11-01 DIAGNOSIS — F419 Anxiety disorder, unspecified: Secondary | ICD-10-CM | POA: Diagnosis not present

## 2022-11-01 DIAGNOSIS — I129 Hypertensive chronic kidney disease with stage 1 through stage 4 chronic kidney disease, or unspecified chronic kidney disease: Secondary | ICD-10-CM | POA: Diagnosis not present

## 2022-11-01 DIAGNOSIS — D8481 Immunodeficiency due to conditions classified elsewhere: Secondary | ICD-10-CM | POA: Diagnosis not present

## 2022-11-01 DIAGNOSIS — Z85828 Personal history of other malignant neoplasm of skin: Secondary | ICD-10-CM | POA: Diagnosis not present

## 2022-11-01 DIAGNOSIS — F4310 Post-traumatic stress disorder, unspecified: Secondary | ICD-10-CM | POA: Diagnosis not present

## 2022-11-01 DIAGNOSIS — Z7984 Long term (current) use of oral hypoglycemic drugs: Secondary | ICD-10-CM | POA: Diagnosis not present

## 2022-11-01 DIAGNOSIS — M810 Age-related osteoporosis without current pathological fracture: Secondary | ICD-10-CM | POA: Diagnosis not present

## 2022-11-01 DIAGNOSIS — E785 Hyperlipidemia, unspecified: Secondary | ICD-10-CM | POA: Diagnosis not present

## 2022-11-01 DIAGNOSIS — Z79891 Long term (current) use of opiate analgesic: Secondary | ICD-10-CM | POA: Diagnosis not present

## 2022-11-02 DIAGNOSIS — Z4789 Encounter for other orthopedic aftercare: Secondary | ICD-10-CM | POA: Diagnosis not present

## 2022-12-01 DIAGNOSIS — M6281 Muscle weakness (generalized): Secondary | ICD-10-CM | POA: Diagnosis not present

## 2022-12-01 DIAGNOSIS — M25561 Pain in right knee: Secondary | ICD-10-CM | POA: Diagnosis not present

## 2022-12-06 DIAGNOSIS — M25561 Pain in right knee: Secondary | ICD-10-CM | POA: Diagnosis not present

## 2022-12-06 DIAGNOSIS — M6281 Muscle weakness (generalized): Secondary | ICD-10-CM | POA: Diagnosis not present

## 2022-12-11 DIAGNOSIS — M25561 Pain in right knee: Secondary | ICD-10-CM | POA: Diagnosis not present

## 2022-12-11 DIAGNOSIS — M6281 Muscle weakness (generalized): Secondary | ICD-10-CM | POA: Diagnosis not present

## 2022-12-14 DIAGNOSIS — M6281 Muscle weakness (generalized): Secondary | ICD-10-CM | POA: Diagnosis not present

## 2022-12-14 DIAGNOSIS — M25561 Pain in right knee: Secondary | ICD-10-CM | POA: Diagnosis not present

## 2022-12-18 DIAGNOSIS — M25561 Pain in right knee: Secondary | ICD-10-CM | POA: Diagnosis not present

## 2022-12-18 DIAGNOSIS — M6281 Muscle weakness (generalized): Secondary | ICD-10-CM | POA: Diagnosis not present

## 2022-12-25 DIAGNOSIS — M25561 Pain in right knee: Secondary | ICD-10-CM | POA: Diagnosis not present

## 2022-12-25 DIAGNOSIS — M6281 Muscle weakness (generalized): Secondary | ICD-10-CM | POA: Diagnosis not present

## 2022-12-27 DIAGNOSIS — M6281 Muscle weakness (generalized): Secondary | ICD-10-CM | POA: Diagnosis not present

## 2022-12-27 DIAGNOSIS — M25561 Pain in right knee: Secondary | ICD-10-CM | POA: Diagnosis not present

## 2023-01-02 DIAGNOSIS — M25561 Pain in right knee: Secondary | ICD-10-CM | POA: Diagnosis not present

## 2023-01-02 DIAGNOSIS — M6281 Muscle weakness (generalized): Secondary | ICD-10-CM | POA: Diagnosis not present

## 2023-01-04 DIAGNOSIS — M6281 Muscle weakness (generalized): Secondary | ICD-10-CM | POA: Diagnosis not present

## 2023-01-04 DIAGNOSIS — M25561 Pain in right knee: Secondary | ICD-10-CM | POA: Diagnosis not present

## 2023-01-09 DIAGNOSIS — Z4789 Encounter for other orthopedic aftercare: Secondary | ICD-10-CM | POA: Diagnosis not present

## 2023-01-11 DIAGNOSIS — M6281 Muscle weakness (generalized): Secondary | ICD-10-CM | POA: Diagnosis not present

## 2023-01-11 DIAGNOSIS — M25561 Pain in right knee: Secondary | ICD-10-CM | POA: Diagnosis not present

## 2023-01-25 DIAGNOSIS — M25561 Pain in right knee: Secondary | ICD-10-CM | POA: Diagnosis not present

## 2023-01-25 DIAGNOSIS — M6281 Muscle weakness (generalized): Secondary | ICD-10-CM | POA: Diagnosis not present

## 2023-01-29 DIAGNOSIS — M6281 Muscle weakness (generalized): Secondary | ICD-10-CM | POA: Diagnosis not present

## 2023-01-29 DIAGNOSIS — M25561 Pain in right knee: Secondary | ICD-10-CM | POA: Diagnosis not present

## 2023-01-30 DIAGNOSIS — Z4789 Encounter for other orthopedic aftercare: Secondary | ICD-10-CM | POA: Diagnosis not present

## 2023-02-01 ENCOUNTER — Other Ambulatory Visit: Payer: Self-pay

## 2023-02-01 DIAGNOSIS — Z5986 Financial insecurity: Secondary | ICD-10-CM

## 2023-02-01 NOTE — Progress Notes (Signed)
   02/01/2023  Patient ID: Rock Loa Gilbert, female   DOB: 06-25-1945, 78 y.o.   MRN: [id number]    2025 Medication Assistance Renewal Application Summary:  Patient was outreached regarding medication assistance renewal for 2025. Verified address, anticipated insurance for 2025, and income has not changed. Patient remains interested in PAP for 2025 for Trulicity , no other new medications were  identified for medication assistance.    Medication Review Findings:  Trulicity  - patient confirms she has enough medication for 6 weeks during the application process and informed patient that she may have to fill medication through insurance if she is not approved by that time; she verbalized understanding.  Medication Assistance Findings:  Medication assistance needs identified: Trulicity      Additional medication assistance options reviewed with patient as warranted:  No other options identified  Plan: I will route patient assistance letter to pharmacy technician who will coordinate patient assistance program application process for medications listed above.  Pharmacy technician will assist with obtaining all required documents from both patient and provider(s) and submit application(s) once completed.    Thank you for allowing pharmacy to be a part of this patient's care.  Dorcas Solian, PharmD Clinical Pharmacist Cell: [phone]

## 2023-02-05 ENCOUNTER — Telehealth: Payer: Self-pay | Admitting: Pharmacy Technician

## 2023-02-05 DIAGNOSIS — Z5986 Financial insecurity: Secondary | ICD-10-CM

## 2023-02-05 DIAGNOSIS — M6281 Muscle weakness (generalized): Secondary | ICD-10-CM | POA: Diagnosis not present

## 2023-02-05 DIAGNOSIS — M25561 Pain in right knee: Secondary | ICD-10-CM | POA: Diagnosis not present

## 2023-02-05 NOTE — Progress Notes (Addendum)
 Pharmacy Medication Assistance Program Note    02/05/2023  Patient ID: Whitney [name], female   DOB: [birth year], 78 y.o.   MRN: [id number]     02/05/2023  Outreach Medication One  Initial Outreach Date (Medication One) 02/01/2023  Manufacturer Medication One Retail Buyer Drugs Trulicity   Dose of Trulicity  3mg /0.35ml  Type of Assistance Manufacturer Assistance  Date Application Sent to Patient 02/07/2023  Application Items Requested Application;Proof of Income;Other  Date Application Sent to Prescriber 02/07/2023  Name of Prescriber Marcellus Baptist     [facility] 03/02/2023 Successful outreach to patient. HIPAA verified. Patient informs she has not received the [employer] application for Trulicity  that was mailed to her as outlined above.. Confirmed address in EPIC CHL is  correct. She has asked for the application to be re mailed. Will re mail when back in office next week.  Tyran Huser, CPhT [place]  Office: [phone] Fax: [phone] Email: [email or web address]

## 2023-02-15 DIAGNOSIS — M6281 Muscle weakness (generalized): Secondary | ICD-10-CM | POA: Diagnosis not present

## 2023-02-15 DIAGNOSIS — M25561 Pain in right knee: Secondary | ICD-10-CM | POA: Diagnosis not present

## 2023-02-20 DIAGNOSIS — M25561 Pain in right knee: Secondary | ICD-10-CM | POA: Diagnosis not present

## 2023-02-20 DIAGNOSIS — M6281 Muscle weakness (generalized): Secondary | ICD-10-CM | POA: Diagnosis not present

## 2023-02-22 DIAGNOSIS — M25561 Pain in right knee: Secondary | ICD-10-CM | POA: Diagnosis not present

## 2023-02-22 DIAGNOSIS — M6281 Muscle weakness (generalized): Secondary | ICD-10-CM | POA: Diagnosis not present

## 2023-02-28 DIAGNOSIS — M25561 Pain in right knee: Secondary | ICD-10-CM | POA: Diagnosis not present

## 2023-02-28 DIAGNOSIS — M6281 Muscle weakness (generalized): Secondary | ICD-10-CM | POA: Diagnosis not present

## 2023-03-21 DIAGNOSIS — I10 Essential (primary) hypertension: Secondary | ICD-10-CM | POA: Diagnosis not present

## 2023-03-21 DIAGNOSIS — Z7984 Long term (current) use of oral hypoglycemic drugs: Secondary | ICD-10-CM | POA: Diagnosis not present

## 2023-03-21 DIAGNOSIS — Z978 Presence of other specified devices: Secondary | ICD-10-CM | POA: Diagnosis not present

## 2023-03-21 DIAGNOSIS — Z7985 Long-term (current) use of injectable non-insulin antidiabetic drugs: Secondary | ICD-10-CM | POA: Diagnosis not present

## 2023-03-21 DIAGNOSIS — E118 Type 2 diabetes mellitus with unspecified complications: Secondary | ICD-10-CM | POA: Diagnosis not present

## 2023-03-21 DIAGNOSIS — E559 Vitamin D deficiency, unspecified: Secondary | ICD-10-CM | POA: Diagnosis not present

## 2023-03-22 ENCOUNTER — Telehealth: Payer: Self-pay | Admitting: Pharmacy Technician

## 2023-03-22 DIAGNOSIS — Z5986 Financial insecurity: Secondary | ICD-10-CM

## 2023-03-22 NOTE — Progress Notes (Signed)
 Pharmacy Medication Assistance Program Note    03/22/2023  Patient ID: Whitney [name], female   DOB: 1945-05-06, 78 y.o.   MRN: [id number]     02/05/2023 03/22/2023  Outreach Medication One  Initial Outreach Date (Medication One) 02/01/2023   Manufacturer Medication One Actor Drugs Trulicity   Dose of Trulicity 3mg /0.38ml   Type of Radiographer, therapeutic Assistance   Date Application Sent to Patient 02/07/2023   Application Items Requested Application;Proof of Income;Other   Date Application Sent to Prescriber 02/07/2023   Name of Prescriber Juleen China   Date Application Received From Patient  03/19/2023  Application Items Received From Patient  Application;Other  Date Application Received From Provider  02/08/2023  Date Application Submitted to Manufacturer  03/21/2023  Method Application Sent to Manufacturer  Fax   Pattricia Boss, CPhT [place]  Office: [phone] Fax: [phone] Email: [email or web address]

## 2023-03-28 ENCOUNTER — Telehealth: Payer: Self-pay | Admitting: Pharmacy Technician

## 2023-03-28 DIAGNOSIS — Z5986 Financial insecurity: Secondary | ICD-10-CM

## 2023-03-28 NOTE — Progress Notes (Signed)
 Pharmacy Medication Assistance Program Note    03/28/2023  Patient ID: Whitney [name], female   DOB: December 20, 1945, 78 y.o.   MRN: [id number]     02/05/2023 03/22/2023 03/28/2023  Outreach Medication One  Initial Outreach Date (Medication One) 02/01/2023    Manufacturer Medication One Print production planner Drugs Trulicity    Dose of Trulicity 3mg /0.54ml    Type of Radiographer, therapeutic Assistance    Date Application Sent to Patient 02/07/2023    Application Items Requested Application;Proof of Income;Other    Date Application Sent to Prescriber 02/07/2023    Name of Prescriber Juleen China    Date Application Received From Patient  03/19/2023   Application Items Received From Patient  Application;Other   Date Application Received From Provider  02/08/2023   Date Application Submitted to Manufacturer  03/21/2023   Method Application Sent to Manufacturer  Fax   Patient Assistance Determination   Approved  Approval Start Date   03/22/2023  Approval End Date   01/23/2024  Patient Notification Method   Telephone Call  Telephone Call Outcome   Successful    Care coordination call made to [employer] in regard to Trulicity application.    Per IVR system and letter scanned into [facility] EMR, patient is APPROVED 03/22/23-01/23/24. Initial medication shipment and subsequent refill shipments will process automatically and be delivered to the patient's home. Patient may call [employer] at any time to check on next shipments by calling [phone].  Successful outreach to patient. HIPAA verified. Patient was informed of her approval. refill procedure and when to expect shipment. Patient verbalized understanding.  Will close case as patient assistance is completed and will send in basket message to [facility] PharmD Reynold Bowen notifying her of this information.  Whitney [name], CPhT [place]  Office: [phone] Fax: [phone] Email: [name]

## 2023-04-02 DIAGNOSIS — I129 Hypertensive chronic kidney disease with stage 1 through stage 4 chronic kidney disease, or unspecified chronic kidney disease: Secondary | ICD-10-CM | POA: Diagnosis not present

## 2023-04-02 DIAGNOSIS — N183 Chronic kidney disease, stage 3 unspecified: Secondary | ICD-10-CM | POA: Diagnosis not present

## 2023-04-12 DIAGNOSIS — Z4789 Encounter for other orthopedic aftercare: Secondary | ICD-10-CM | POA: Diagnosis not present

## 2023-07-17 DIAGNOSIS — N1832 Chronic kidney disease, stage 3b: Secondary | ICD-10-CM | POA: Diagnosis not present

## 2023-07-17 DIAGNOSIS — E1122 Type 2 diabetes mellitus with diabetic chronic kidney disease: Secondary | ICD-10-CM | POA: Diagnosis not present

## 2023-07-19 DIAGNOSIS — E119 Type 2 diabetes mellitus without complications: Secondary | ICD-10-CM | POA: Diagnosis not present

## 2023-07-24 DIAGNOSIS — E8729 Other acidosis: Secondary | ICD-10-CM | POA: Diagnosis not present

## 2023-07-24 DIAGNOSIS — N1419 Nephropathy induced by other drugs, medicaments and biological substances: Secondary | ICD-10-CM | POA: Diagnosis not present

## 2023-07-24 DIAGNOSIS — M898X9 Other specified disorders of bone, unspecified site: Secondary | ICD-10-CM | POA: Diagnosis not present

## 2023-07-24 DIAGNOSIS — T39395A Adverse effect of other nonsteroidal anti-inflammatory drugs [NSAID], initial encounter: Secondary | ICD-10-CM | POA: Diagnosis not present

## 2023-07-24 DIAGNOSIS — N1832 Chronic kidney disease, stage 3b: Secondary | ICD-10-CM | POA: Diagnosis not present

## 2023-07-24 DIAGNOSIS — I129 Hypertensive chronic kidney disease with stage 1 through stage 4 chronic kidney disease, or unspecified chronic kidney disease: Secondary | ICD-10-CM | POA: Diagnosis not present

## 2023-07-24 DIAGNOSIS — E559 Vitamin D deficiency, unspecified: Secondary | ICD-10-CM | POA: Diagnosis not present

## 2023-07-24 DIAGNOSIS — D631 Anemia in chronic kidney disease: Secondary | ICD-10-CM | POA: Diagnosis not present

## 2023-07-24 DIAGNOSIS — E1122 Type 2 diabetes mellitus with diabetic chronic kidney disease: Secondary | ICD-10-CM | POA: Diagnosis not present

## 2023-09-10 DIAGNOSIS — Z1339 Encounter for screening examination for other mental health and behavioral disorders: Secondary | ICD-10-CM | POA: Diagnosis not present

## 2023-09-10 DIAGNOSIS — Z0000 Encounter for general adult medical examination without abnormal findings: Secondary | ICD-10-CM | POA: Diagnosis not present

## 2023-09-10 DIAGNOSIS — F32A Depression, unspecified: Secondary | ICD-10-CM | POA: Diagnosis not present

## 2023-09-10 DIAGNOSIS — Z6834 Body mass index (BMI) 34.0-34.9, adult: Secondary | ICD-10-CM | POA: Diagnosis not present

## 2023-09-10 DIAGNOSIS — Z1331 Encounter for screening for depression: Secondary | ICD-10-CM | POA: Diagnosis not present

## 2023-09-10 DIAGNOSIS — E1169 Type 2 diabetes mellitus with other specified complication: Secondary | ICD-10-CM | POA: Diagnosis not present

## 2023-09-10 DIAGNOSIS — R413 Other amnesia: Secondary | ICD-10-CM | POA: Diagnosis not present

## 2023-09-10 DIAGNOSIS — N1832 Chronic kidney disease, stage 3b: Secondary | ICD-10-CM | POA: Diagnosis not present

## 2023-09-18 DIAGNOSIS — E119 Type 2 diabetes mellitus without complications: Secondary | ICD-10-CM | POA: Diagnosis not present

## 2023-09-18 DIAGNOSIS — E559 Vitamin D deficiency, unspecified: Secondary | ICD-10-CM | POA: Diagnosis not present

## 2023-09-26 DIAGNOSIS — Z6834 Body mass index (BMI) 34.0-34.9, adult: Secondary | ICD-10-CM | POA: Diagnosis not present

## 2023-09-26 DIAGNOSIS — N6320 Unspecified lump in the left breast, unspecified quadrant: Secondary | ICD-10-CM | POA: Diagnosis not present

## 2023-10-11 DIAGNOSIS — Z6834 Body mass index (BMI) 34.0-34.9, adult: Secondary | ICD-10-CM | POA: Diagnosis not present

## 2023-10-11 DIAGNOSIS — F32A Depression, unspecified: Secondary | ICD-10-CM | POA: Diagnosis not present

## 2023-10-17 DIAGNOSIS — N6325 Unspecified lump in the left breast, overlapping quadrants: Secondary | ICD-10-CM | POA: Diagnosis not present

## 2023-10-17 DIAGNOSIS — R928 Other abnormal and inconclusive findings on diagnostic imaging of breast: Secondary | ICD-10-CM | POA: Diagnosis not present

## 2023-10-17 DIAGNOSIS — R92333 Mammographic heterogeneous density, bilateral breasts: Secondary | ICD-10-CM | POA: Diagnosis not present

## 2023-10-17 DIAGNOSIS — N6324 Unspecified lump in the left breast, lower inner quadrant: Secondary | ICD-10-CM | POA: Diagnosis not present

## 2023-10-22 DIAGNOSIS — Z9889 Other specified postprocedural states: Secondary | ICD-10-CM | POA: Diagnosis not present

## 2023-10-22 DIAGNOSIS — R92332 Mammographic heterogeneous density, left breast: Secondary | ICD-10-CM | POA: Diagnosis not present

## 2023-10-22 DIAGNOSIS — R928 Other abnormal and inconclusive findings on diagnostic imaging of breast: Secondary | ICD-10-CM | POA: Diagnosis not present

## 2023-10-22 DIAGNOSIS — C50812 Malignant neoplasm of overlapping sites of left female breast: Secondary | ICD-10-CM | POA: Diagnosis not present

## 2023-10-25 NOTE — Progress Notes (Signed)
 Initial phone contact with newly diagnosed breast cancer pt. Pt reports that her daughter was diagnosed with breast cancer at the age of 20. Encouraged pt to come by the Cancer Center and pick up copy of The Breast Cancer Treatment Handbook. Navigation contact info given. Dr. Marcellus Holt's office is arranging surgical referral to Dr. Ozell Skene.

## 2023-10-30 NOTE — Progress Notes (Signed)
 Phone contact with pt. Pt is scheduled for an appt with Dr. Bert for 11/06/23.

## 2023-11-06 DIAGNOSIS — Z170 Estrogen receptor positive status [ER+]: Secondary | ICD-10-CM | POA: Diagnosis not present

## 2023-11-06 DIAGNOSIS — C50112 Malignant neoplasm of central portion of left female breast: Secondary | ICD-10-CM | POA: Diagnosis not present

## 2023-11-06 DIAGNOSIS — R928 Other abnormal and inconclusive findings on diagnostic imaging of breast: Secondary | ICD-10-CM | POA: Diagnosis not present

## 2023-11-06 DIAGNOSIS — Z9889 Other specified postprocedural states: Secondary | ICD-10-CM | POA: Diagnosis not present

## 2023-11-21 DIAGNOSIS — I129 Hypertensive chronic kidney disease with stage 1 through stage 4 chronic kidney disease, or unspecified chronic kidney disease: Secondary | ICD-10-CM | POA: Diagnosis not present

## 2023-11-21 DIAGNOSIS — K2270 Barrett's esophagus without dysplasia: Secondary | ICD-10-CM | POA: Diagnosis not present

## 2023-11-21 DIAGNOSIS — F0390 Unspecified dementia without behavioral disturbance: Secondary | ICD-10-CM | POA: Diagnosis not present

## 2023-11-21 DIAGNOSIS — C50412 Malignant neoplasm of upper-outer quadrant of left female breast: Secondary | ICD-10-CM | POA: Diagnosis not present

## 2023-11-21 DIAGNOSIS — F419 Anxiety disorder, unspecified: Secondary | ICD-10-CM | POA: Diagnosis not present

## 2023-11-21 DIAGNOSIS — Z7984 Long term (current) use of oral hypoglycemic drugs: Secondary | ICD-10-CM | POA: Diagnosis not present

## 2023-11-21 DIAGNOSIS — Z85828 Personal history of other malignant neoplasm of skin: Secondary | ICD-10-CM | POA: Diagnosis not present

## 2023-11-21 DIAGNOSIS — Z7985 Long-term (current) use of injectable non-insulin antidiabetic drugs: Secondary | ICD-10-CM | POA: Diagnosis not present

## 2023-11-21 DIAGNOSIS — E7800 Pure hypercholesterolemia, unspecified: Secondary | ICD-10-CM | POA: Diagnosis not present

## 2023-11-21 DIAGNOSIS — I10 Essential (primary) hypertension: Secondary | ICD-10-CM | POA: Diagnosis not present

## 2023-11-21 DIAGNOSIS — N183 Chronic kidney disease, stage 3 unspecified: Secondary | ICD-10-CM | POA: Diagnosis not present

## 2023-11-21 DIAGNOSIS — E1122 Type 2 diabetes mellitus with diabetic chronic kidney disease: Secondary | ICD-10-CM | POA: Diagnosis not present

## 2023-11-21 DIAGNOSIS — C50512 Malignant neoplasm of lower-outer quadrant of left female breast: Secondary | ICD-10-CM | POA: Diagnosis not present

## 2023-11-21 DIAGNOSIS — C50112 Malignant neoplasm of central portion of left female breast: Secondary | ICD-10-CM | POA: Diagnosis not present

## 2023-11-21 DIAGNOSIS — R1312 Dysphagia, oropharyngeal phase: Secondary | ICD-10-CM | POA: Diagnosis not present

## 2023-11-21 DIAGNOSIS — F4310 Post-traumatic stress disorder, unspecified: Secondary | ICD-10-CM | POA: Diagnosis not present

## 2023-11-21 DIAGNOSIS — F32A Depression, unspecified: Secondary | ICD-10-CM | POA: Diagnosis not present

## 2023-11-21 DIAGNOSIS — Z170 Estrogen receptor positive status [ER+]: Secondary | ICD-10-CM | POA: Diagnosis not present

## 2023-11-21 DIAGNOSIS — Z8673 Personal history of transient ischemic attack (TIA), and cerebral infarction without residual deficits: Secondary | ICD-10-CM | POA: Diagnosis not present

## 2023-11-21 DIAGNOSIS — Z86718 Personal history of other venous thrombosis and embolism: Secondary | ICD-10-CM | POA: Diagnosis not present

## 2023-11-21 DIAGNOSIS — Z79899 Other long term (current) drug therapy: Secondary | ICD-10-CM | POA: Diagnosis not present

## 2023-11-21 DIAGNOSIS — C50912 Malignant neoplasm of unspecified site of left female breast: Secondary | ICD-10-CM | POA: Diagnosis not present

## 2023-11-21 DIAGNOSIS — K589 Irritable bowel syndrome without diarrhea: Secondary | ICD-10-CM | POA: Diagnosis not present

## 2023-11-21 DIAGNOSIS — J45909 Unspecified asthma, uncomplicated: Secondary | ICD-10-CM | POA: Diagnosis not present

## 2023-11-21 DIAGNOSIS — M1990 Unspecified osteoarthritis, unspecified site: Secondary | ICD-10-CM | POA: Diagnosis not present

## 2023-12-10 DIAGNOSIS — I10 Essential (primary) hypertension: Secondary | ICD-10-CM | POA: Diagnosis not present

## 2023-12-10 DIAGNOSIS — E559 Vitamin D deficiency, unspecified: Secondary | ICD-10-CM | POA: Diagnosis not present

## 2023-12-10 DIAGNOSIS — E119 Type 2 diabetes mellitus without complications: Secondary | ICD-10-CM | POA: Diagnosis not present

## 2023-12-23 NOTE — Progress Notes (Unsigned)
 [facility] at [facility] [address] [place],  [place]  [address] [phone]  Clinic Day:  12/23/2023  Referring physician: Bert Ozell BIRCH., MD   HISTORY OF PRESENT ILLNESS:  Whitney [name] is a 78 y.o. female who I was asked to consult upon for newly diagnosed breast cancer.  Her history dates back to *** when ***.  A screening/diagnostic mammogram and ultrasound showed a ***.  A biopsy of this lesion was done, which came back positive for ***.  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  Receptor testing showed her tumor to be estrogen receptor positive, progesterone receptor positive, but Her2/neu receptor negative.  One sentinel lymph node was removed, which came back negative for nodal involvement.  The patient comes in today to go over her breast cancer surgical pathology and its implications.   Of note, her last mammogram was ***.  Her family history is pertinent for ***breast cancer.  There *** ovarian cancer PAST MEDICAL HISTORY:   Past Medical History:  Diagnosis Date  . Anxiety   . Arthritis   . Asthma   . Cancer (HCC)    skin cancer- face-   . Chronic kidney disease    CKD  . Complication of anesthesia   . Concussion   . Depression   . Diabetes mellitus without complication (HCC)   . GERD (gastroesophageal reflux disease)   . Hypertension   . IBS (irritable bowel syndrome)    diarrhea  . MRSA (methicillin resistant Staphylococcus aureus)    after skin cancer removed from face.  . Neuromuscular disorder (HCC)    Neuropathy both feet  . Neuropathy   . Pneumonia   . PONV (postoperative nausea and vomiting)    years ago  . PTSD (post-traumatic stress disorder)     PAST SURGICAL HISTORY:   Past Surgical History:  Procedure Laterality Date  . ABDOMINAL HYSTERECTOMY    . CARPAL TUNNEL RELEASE Bilateral   . CATARACT EXTRACTION Bilateral    . CHOLECYSTECTOMY    . DILATION AND CURETTAGE OF UTERUS    . LOWER EXTREMITY VENOGRAPHY N/A 01/10/2017   Procedure: LOWER EXTREMITY ASCENDING VENOGRAPHY;  Surgeon: Sheree Penne Bruckner, MD;  Location: [facility] INVASIVE CV LAB;  Service: Cardiovascular;  Laterality: N/A;  . OVARIAN CYST SURGERY    . PERIPHERAL VASCULAR BALLOON ANGIOPLASTY Right 01/10/2017   Procedure: PERIPHERAL VASCULAR BALLOON ANGIOPLASTY;  Surgeon: Sheree Penne Bruckner, MD;  Location: [facility] INVASIVE CV LAB;  Service: Cardiovascular;  Laterality: Right;  . ROTATOR CUFF REPAIR Bilateral   . SALPINGOOPHORECTOMY Bilateral   . SHOULDER ARTHROSCOPY WITH SUBACROMIAL DECOMPRESSION AND OPEN ROTATOR C Right 01/31/2013   Procedure: RIGHT SHOULDER ARTHROSCOPY WITH SUBACROMIAL DECOMPRESSION AND MINI OPEN ROTATOR CUFF REPAIR, OPEN BICEP TENDODESIS AND OPEN DISTAL CLAVICLE RESECTION;  Surgeon: Elspeth JONELLE Her, MD;  Location: [facility] OR;  Service: Orthopedics;  Laterality: Right;  . SHOULDER ARTHROSCOPY WITH SUBACROMIAL DECOMPRESSION AND OPEN ROTATOR C Left 11/28/2013   Procedure: LEFT SHOULDER ARTHROSCOPY WITH SUBACROMIAL DECOMPRESSION  BICEP TENODESIS MINI OPEN ROTATOR CUFF REPAIR, OPEN DISTAL CLAVICLE RESECTION ;  Surgeon: Elspeth JONELLE Her, MD;  Location: [facility] OR;  Service: Orthopedics;  Laterality: Left;  . TOTAL KNEE ARTHROPLASTY Left 10/21/2021   Procedure: TOTAL KNEE ARTHROPLASTY;  Surgeon: Her Kemps, MD;  Location: [facility] ORS;  Service: Orthopedics;  Laterality: Left;  . TOTAL KNEE ARTHROPLASTY Right 10/06/2022   Procedure: TOTAL KNEE  ARTHROPLASTY;  Surgeon: Kay Kemps, MD;  Location: [facility] ORS;  Service: Orthopedics;  Laterality: Right;  120  . TRIGGER FINGER RELEASE Right    4   . TRIGGER FINGER RELEASE Bilateral    thumbs  . TUBAL LIGATION      CURRENT MEDICATIONS:   Current Outpatient Medications  Medication Sig Dispense Refill  . acetaminophen  (TYLENOL ) 650 MG CR tablet Take 650 mg by mouth every 8 (eight) hours as needed for pain.     . albuterol  (PROVENTIL  HFA;VENTOLIN  HFA) 108 (90 BASE) MCG/ACT inhaler Inhale 2 puffs into the lungs every 6 (six) hours as needed for wheezing or shortness of breath.     . atorvastatin  (LIPITOR) 40 MG tablet Take 40 mg by mouth at bedtime.    . Biotin w/ Vitamins C & E (HAIR/SKIN/NAILS PO) Take 1 tablet by mouth daily.    . buPROPion  (WELLBUTRIN  XL) 150 MG 24 hr tablet Take 150 mg by mouth daily.    . cetirizine (ZYRTEC) 10 MG tablet Take 10 mg by mouth daily.    . cholecalciferol  (VITAMIN D3) 25 MCG (1000 UNIT) tablet Take 1,000 Units by mouth daily.    . citalopram  (CELEXA ) 40 MG tablet Take 40 mg by mouth daily.    . clonazePAM  (KLONOPIN ) 1 MG tablet Take 1 mg by mouth 2 (two) times daily.    . Coenzyme Q10 (CO Q-10) 200 MG CAPS Take 200 mg by mouth daily.    . cyanocobalamin  (VITAMIN B12) 1000 MCG tablet Take 1,000 mcg by mouth daily.    . donepezil  (ARICEPT ) 5 MG tablet Take 5 mg by mouth at bedtime.    . Dulaglutide  (TRULICITY ) 3 MG/0.5ML SOPN Inject 3 mg into the skin every Wednesday.    . furosemide  (LASIX ) 20 MG tablet Take 20 mg by mouth daily.    . gabapentin  (NEURONTIN ) 300 MG capsule Take 300 mg by mouth in the morning and at bedtime.    . HYDROcodone -acetaminophen  (NORCO) 5-325 MG tablet Take 1-2 tablets by mouth every 4 (four) hours as needed for moderate pain or severe pain. 40 tablet 0  . Melatonin 10 MG TABS Take 10 mg by mouth at bedtime.    . methocarbamol  (ROBAXIN ) 500 MG tablet Take 1 tablet (500 mg total) by mouth every 8 (eight) hours as needed for muscle spasms. 60 tablet 1  . nitroGLYCERIN  (NITROSTAT ) 0.4 MG SL tablet Place 0.4 mg under the tongue every 5 (five) minutes as needed for chest pain.    SABRA OIL BASE EX Apply 1 application topically 4 (four) times daily as needed (for pain.). CBD OIL    . olmesartan (BENICAR) 40 MG tablet Take 40 mg by mouth daily.    SABRA omeprazole (PRILOSEC) 40 MG capsule Take 40 mg by mouth daily.    . pioglitazone  (ACTOS ) 15 MG tablet  Take 15 mg by mouth daily.     No current facility-administered medications for this visit.    ALLERGIES:   Allergies  Allergen Reactions  . Talwin [Pentazocine] Nausea Only and Other (See Comments)    Confusion  . Other     Blood Product Refusal   . Codeine Nausea And Vomiting and Other (See Comments)    disorientation  . Erythromycin Hives  . Morphine  And Codeine Rash    Blisters, pt states she can take this med in small doses   . Percocet [Oxycodone -Acetaminophen ] Itching    FAMILY HISTORY:   Family History  Problem Relation Age of Onset  .  Heart attack Father   . Diabetes Other   . Cancer Other   . Hypertension Other     SOCIAL HISTORY:   reports that she has never smoked. She has been exposed to tobacco smoke. She has never used smokeless tobacco. She reports that she does not drink alcohol and does not use drugs.  REVIEW OF SYSTEMS:  Review of Systems - Oncology   PHYSICAL EXAM:  There were no vitals taken for this visit. Wt Readings from Last 3 Encounters:  10/06/22 184 lb (83.5 kg)  09/26/22 189 lb (85.7 kg)  10/21/21 187 lb 15.8 oz (85.3 kg)   There is no height or weight on file to calculate BMI. Performance status (ECOG): {CHL ONC [id number] Physical Exam  LABS:      Latest Ref Rng & Units 09/26/2022    3:14 PM 10/23/2021    4:10 AM 10/22/2021    3:55 AM  CBC  WBC 4.0 - 10.5 K/uL 6.4  8.1  8.8   Hemoglobin 12.0 - 15.0 g/dL 86.4  9.6  9.9   Hematocrit 36.0 - 46.0 % 42.6  31.0  33.0   Platelets 150 - 400 K/uL 206  148  144       Latest Ref Rng & Units 09/26/2022    3:14 PM 10/22/2021    3:55 AM 10/14/2021    3:06 PM  CMP  Glucose 70 - 99 mg/dL 819  686  875   BUN 8 - 23 mg/dL 15  33  23   Creatinine 0.44 - 1.00 mg/dL 8.77  8.23  8.44   Sodium 135 - 145 mmol/L 137  140  138   Potassium 3.5 - 5.1 mmol/L 4.2  4.0  4.5   Chloride 98 - 111 mmol/L 103  109  105   CO2 22 - 32 mmol/L 23  21  23    Calcium  8.9 - 10.3 mg/dL 9.2  8.6  9.2      No  results found for: CEA1, CEA / No results found for: CEA1, CEA No results found for: PSA1 No results found for: CAN199 No results found for: CAN125  No results found for: TOTALPROTELP, ALBUMINELP, A1GS, A2GS, BETS, BETA2SER, GAMS, MSPIKE, SPEI No results found for: TIBC, FERRITIN, IRONPCTSAT No results found for: LDH  Review Flowsheet        No data to display           STUDIES:  No results found.   ASSESSMENT & PLAN:   Whitney [name] is a 78 y.o. female who I was asked to consult upon for newly diagnosed stage *** breast cancer, status post a lump/mastectomy in ***.     The patient understands all the plans discussed today and is in agreement with them.  I do appreciate Lininger, Ozell BIRCH., MD for his new consult. so   Sonjia Wilcoxson DELENA Kerns, MD  rate and regular rhythm.     Heart sounds: No murmur heard.    No friction  rub. No gallop.  Pulmonary:     Breath sounds: Normal breath sounds.  Chest:  Breasts:    Right: No swelling, bleeding, inverted nipple, mass, nipple discharge or skin change.     Left: No swelling, bleeding, inverted nipple, mass, nipple discharge or skin change.     Comments: Wound dehiscence is seen in the lower/inferior aspects of her left breast from her recent lumpectomy.  It appears to be healing fairly well; no purulent discharge is seen. Abdominal:     General: Bowel sounds are normal. There is no distension.     Palpations: Abdomen is soft. There is no mass.     Tenderness: There is no abdominal tenderness.  Musculoskeletal:        General: No tenderness.     Cervical back: Normal range of motion and neck supple.     Right lower leg: No edema.     Left lower leg: No edema.  Lymphadenopathy:     Cervical: No cervical adenopathy.     Right cervical: No superficial, deep or posterior cervical adenopathy.    Left cervical: No superficial, deep or posterior cervical adenopathy.     Upper Body:     Right upper body: No supraclavicular or axillary adenopathy.     Left upper body: No supraclavicular or axillary adenopathy.     Lower Body: No right inguinal adenopathy. No left inguinal adenopathy.  Skin:    Coloration: Skin is not jaundiced.     Findings: No lesion or rash.  Neurological:     General: No focal deficit present.     Mental Status: She is alert and oriented to person, place, and time. Mental status is at baseline.  Psychiatric:        Mood and Affect: Mood normal.        Behavior: Behavior normal.        Thought Content: Thought content normal.        Judgment: Judgment normal.    LABS:      Latest Ref Rng & Units 09/26/2022    3:14 PM 10/23/2021    4:10 AM 10/22/2021    3:55 AM  CBC  WBC 4.0 - 10.5 K/uL 6.4  8.1  8.8   Hemoglobin 12.0 - 15.0 g/dL 86.4  9.6  9.9   Hematocrit 36.0 - 46.0 % 42.6  31.0  33.0   Platelets 150 - 400 K/uL 206  148  144        Latest Ref Rng & Units 09/26/2022    3:14 PM 10/22/2021    3:55 AM 10/14/2021    3:06 PM  CMP  Glucose 70 - 99 mg/dL 819  686  875   BUN 8 - 23 mg/dL 15  33  23   Creatinine 0.44 - 1.00 mg/dL 8.77  8.23  8.44   Sodium 135 - 145 mmol/L 137  140  138   Potassium 3.5 - 5.1 mmol/L 4.2  4.0  4.5   Chloride 98 - 111 mmol/L 103  109  105   CO2 22 - 32 mmol/L 23  21  23    Calcium  8.9 - 10.3 mg/dL 9.2  8.6  9.2    ASSESSMENT & PLAN:   Rashad Obeid is a 78 y.o. female who I was asked to consult upon for newly diagnosed stage IA (T1c N0 M0) hormone positive breast cancer,  status post a left breast lumpectomy in October 2025.  In clinic today, I went over all of her breast cancer pathology with her.  As her disease is hormone receptor positive, sentinel lymph node negative, and greater than 1 cm in size, I will have her breast cancer tissue sent for Oncotype testing to determine how high her risk is of developing distant breast cancer recurrence within the next decade.  The patient understands that if Oncotype testing shows her to have a high risk of distant disease recurrence over the next 10 years, I would recommend adjuvant chemotherapy.  Otherwise, she would just be treated with 5 years of endocrine therapy only.  I will see this patient back in 3 weeks to go over her Oncotype test results and their implications.  The patient understands all the plans discussed today and is in agreement with them.  I do appreciate Lininger, Ozell BIRCH., MD for his new consult. so   April Carlyon DELENA Kerns, MD

## 2023-12-23 NOTE — Progress Notes (Incomplete)
 [facility] at [facility] [address] [place],  [place]  [address] [phone]  Clinic Day:  12/23/2023  Referring physician: Bert Ozell BIRCH., MD   HISTORY OF PRESENT ILLNESS:  Whitney [name] is a 78 y.o. female who I was asked to consult upon for newly diagnosed breast cancer.  Her history dates back to *** when ***.  A screening/diagnostic mammogram and ultrasound showed a ***.  A biopsy of this lesion was done, which came back positive for ***.  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  Receptor testing showed her tumor to be estrogen receptor positive, progesterone receptor positive, but Her2/neu receptor negative.  One sentinel lymph node was removed, which came back negative for nodal involvement.  The patient comes in today to go over her breast cancer surgical pathology and its implications.   Of note, her last mammogram was ***.  Her family history is pertinent for ***breast cancer.  There *** ovarian cancer PAST MEDICAL HISTORY:   Past Medical History:  Diagnosis Date  . Anxiety   . Arthritis   . Asthma   . Cancer (HCC)    skin cancer- face-   . Chronic kidney disease    CKD  . Complication of anesthesia   . Concussion   . Depression   . Diabetes mellitus without complication (HCC)   . GERD (gastroesophageal reflux disease)   . Hypertension   . IBS (irritable bowel syndrome)    diarrhea  . MRSA (methicillin resistant Staphylococcus aureus)    after skin cancer removed from face.  . Neuromuscular disorder (HCC)    Neuropathy both feet  . Neuropathy   . Pneumonia   . PONV (postoperative nausea and vomiting)    years ago  . PTSD (post-traumatic stress disorder)     PAST SURGICAL HISTORY:   Past Surgical History:  Procedure Laterality Date  . ABDOMINAL HYSTERECTOMY    . CARPAL TUNNEL RELEASE Bilateral   . CATARACT EXTRACTION Bilateral    . CHOLECYSTECTOMY    . DILATION AND CURETTAGE OF UTERUS    . LOWER EXTREMITY VENOGRAPHY N/A 01/10/2017   Procedure: LOWER EXTREMITY ASCENDING VENOGRAPHY;  Surgeon: Sheree Penne Bruckner, MD;  Location: [facility] INVASIVE CV LAB;  Service: Cardiovascular;  Laterality: N/A;  . OVARIAN CYST SURGERY    . PERIPHERAL VASCULAR BALLOON ANGIOPLASTY Right 01/10/2017   Procedure: PERIPHERAL VASCULAR BALLOON ANGIOPLASTY;  Surgeon: Sheree Penne Bruckner, MD;  Location: [facility] INVASIVE CV LAB;  Service: Cardiovascular;  Laterality: Right;  . ROTATOR CUFF REPAIR Bilateral   . SALPINGOOPHORECTOMY Bilateral   . SHOULDER ARTHROSCOPY WITH SUBACROMIAL DECOMPRESSION AND OPEN ROTATOR C Right 01/31/2013   Procedure: RIGHT SHOULDER ARTHROSCOPY WITH SUBACROMIAL DECOMPRESSION AND MINI OPEN ROTATOR CUFF REPAIR, OPEN BICEP TENDODESIS AND OPEN DISTAL CLAVICLE RESECTION;  Surgeon: Elspeth JONELLE Her, MD;  Location: [facility] OR;  Service: Orthopedics;  Laterality: Right;  . SHOULDER ARTHROSCOPY WITH SUBACROMIAL DECOMPRESSION AND OPEN ROTATOR C Left 11/28/2013   Procedure: LEFT SHOULDER ARTHROSCOPY WITH SUBACROMIAL DECOMPRESSION  BICEP TENODESIS MINI OPEN ROTATOR CUFF REPAIR, OPEN DISTAL CLAVICLE RESECTION ;  Surgeon: Elspeth JONELLE Her, MD;  Location: [facility] OR;  Service: Orthopedics;  Laterality: Left;  . TOTAL KNEE ARTHROPLASTY Left 10/21/2021   Procedure: TOTAL KNEE ARTHROPLASTY;  Surgeon: Her Kemps, MD;  Location: [facility]S;  Service: Orthopedics;  Laterality: Left;  . TOTAL KNEE ARTHROPLASTY Right 10/06/2022   Procedure: TOTAL KNEE  ARTHROPLASTY;  Surgeon: Kay Kemps, MD;  Location: [facility];  Service: Orthopedics;  Laterality: Right;  120  . TRIGGER FINGER RELEASE Right    4   . TRIGGER FINGER RELEASE Bilateral    thumbs  . TUBAL LIGATION      CURRENT MEDICATIONS:   Current Outpatient Medications  Medication Sig Dispense Refill  . acetaminophen  (TYLENOL ) 650 MG CR tablet Take 650 mg by mouth every 8 (eight) hours as needed for pain.     . albuterol  (PROVENTIL  HFA;VENTOLIN  HFA) 108 (90 BASE) MCG/ACT inhaler Inhale 2 puffs into the lungs every 6 (six) hours as needed for wheezing or shortness of breath.     . atorvastatin  (LIPITOR) 40 MG tablet Take 40 mg by mouth at bedtime.    . Biotin w/ Vitamins C & E (HAIR/SKIN/NAILS PO) Take 1 tablet by mouth daily.    . buPROPion  (WELLBUTRIN  XL) 150 MG 24 hr tablet Take 150 mg by mouth daily.    . cetirizine (ZYRTEC) 10 MG tablet Take 10 mg by mouth daily.    . cholecalciferol  (VITAMIN D3) 25 MCG (1000 UNIT) tablet Take 1,000 Units by mouth daily.    . citalopram  (CELEXA ) 40 MG tablet Take 40 mg by mouth daily.    . clonazePAM  (KLONOPIN ) 1 MG tablet Take 1 mg by mouth 2 (two) times daily.    . Coenzyme Q10 (CO Q-10) 200 MG CAPS Take 200 mg by mouth daily.    . cyanocobalamin  (VITAMIN B12) 1000 MCG tablet Take 1,000 mcg by mouth daily.    . donepezil  (ARICEPT ) 5 MG tablet Take 5 mg by mouth at bedtime.    . Dulaglutide  (TRULICITY ) 3 MG/0.5ML SOPN Inject 3 mg into the skin every Wednesday.    . furosemide  (LASIX ) 20 MG tablet Take 20 mg by mouth daily.    . gabapentin  (NEURONTIN ) 300 MG capsule Take 300 mg by mouth in the morning and at bedtime.    . HYDROcodone -acetaminophen  (NORCO) 5-325 MG tablet Take 1-2 tablets by mouth every 4 (four) hours as needed for moderate pain or severe pain. 40 tablet 0  . Melatonin 10 MG TABS Take 10 mg by mouth at bedtime.    . methocarbamol  (ROBAXIN ) 500 MG tablet Take 1 tablet (500 mg total) by mouth every 8 (eight) hours as needed for muscle spasms. 60 tablet 1  . nitroGLYCERIN  (NITROSTAT ) 0.4 MG SL tablet Place 0.4 mg under the tongue every 5 (five) minutes as needed for chest pain.    SABRA OIL BASE EX Apply 1 application topically 4 (four) times daily as needed (for pain.). CBD OIL    . olmesartan (BENICAR) 40 MG tablet Take 40 mg by mouth daily.    SABRA omeprazole (PRILOSEC) 40 MG capsule Take 40 mg by mouth daily.    . pioglitazone  (ACTOS ) 15 MG tablet  Take 15 mg by mouth daily.     No current facility-administered medications for this visit.    ALLERGIES:   Allergies  Allergen Reactions  . Talwin [Pentazocine] Nausea Only and Other (See Comments)    Confusion  . Other     Blood Product Refusal   . Codeine Nausea And Vomiting and Other (See Comments)    disorientation  . Erythromycin Hives  . Morphine  And Codeine Rash    Blisters, pt states she can take this med in small doses   . Percocet [Oxycodone -Acetaminophen ] Itching    FAMILY HISTORY:   Family History  Problem Relation Age of Onset  .  Heart attack Father   . Diabetes Other   . Cancer Other   . Hypertension Other     SOCIAL HISTORY:   reports that she has never smoked. She has been exposed to tobacco smoke. She has never used smokeless tobacco. She reports that she does not drink alcohol and does not use drugs.  REVIEW OF SYSTEMS:  Review of Systems - Oncology   PHYSICAL EXAM:  There were no vitals taken for this visit. Wt Readings from Last 3 Encounters:  10/06/22 184 lb (83.5 kg)  09/26/22 189 lb (85.7 kg)  10/21/21 187 lb 15.8 oz (85.3 kg)   There is no height or weight on file to calculate BMI. Performance status (ECOG): {CHL ONC [id number] Physical Exam  LABS:      Latest Ref Rng & Units 09/26/2022    3:14 PM 10/23/2021    4:10 AM 10/22/2021    3:55 AM  CBC  WBC 4.0 - 10.5 K/uL 6.4  8.1  8.8   Hemoglobin 12.0 - 15.0 g/dL 86.4  9.6  9.9   Hematocrit 36.0 - 46.0 % 42.6  31.0  33.0   Platelets 150 - 400 K/uL 206  148  144       Latest Ref Rng & Units 09/26/2022    3:14 PM 10/22/2021    3:55 AM 10/14/2021    3:06 PM  CMP  Glucose 70 - 99 mg/dL 819  686  875   BUN 8 - 23 mg/dL 15  33  23   Creatinine 0.44 - 1.00 mg/dL 8.77  8.23  8.44   Sodium 135 - 145 mmol/L 137  140  138   Potassium 3.5 - 5.1 mmol/L 4.2  4.0  4.5   Chloride 98 - 111 mmol/L 103  109  105   CO2 22 - 32 mmol/L 23  21  23    Calcium  8.9 - 10.3 mg/dL 9.2  8.6  9.2      No  results found for: CEA1, CEA / No results found for: CEA1, CEA No results found for: PSA1 No results found for: CAN199 No results found for: CAN125  No results found for: TOTALPROTELP, ALBUMINELP, A1GS, A2GS, BETS, BETA2SER, GAMS, MSPIKE, SPEI No results found for: TIBC, FERRITIN, IRONPCTSAT No results found for: LDH  Review Flowsheet        No data to display           STUDIES:  No results found.   ASSESSMENT & PLAN:   Whitney [name] is a 78 y.o. female who I was asked to consult upon for newly diagnosed stage *** breast cancer, status post a lump/mastectomy in ***.     The patient understands all the plans discussed today and is in agreement with them.  I do appreciate Lininger, Ozell BIRCH., MD for his new consult. so   Sonjia Wilcoxson DELENA Kerns, MD

## 2023-12-24 ENCOUNTER — Telehealth: Payer: Self-pay | Admitting: Oncology

## 2023-12-24 ENCOUNTER — Inpatient Hospital Stay: Attending: Oncology | Admitting: Oncology

## 2023-12-24 VITALS — BP 150/62 | HR 74 | Temp 99.4°F | Resp 14 | Ht 63.5 in | Wt 187.4 lb

## 2023-12-24 DIAGNOSIS — Z170 Estrogen receptor positive status [ER+]: Secondary | ICD-10-CM | POA: Diagnosis not present

## 2023-12-24 DIAGNOSIS — Z1721 Progesterone receptor positive status: Secondary | ICD-10-CM | POA: Diagnosis not present

## 2023-12-24 DIAGNOSIS — C50512 Malignant neoplasm of lower-outer quadrant of left female breast: Secondary | ICD-10-CM | POA: Diagnosis not present

## 2023-12-24 DIAGNOSIS — Z1732 Human epidermal growth factor receptor 2 negative status: Secondary | ICD-10-CM | POA: Insufficient documentation

## 2023-12-24 NOTE — Telephone Encounter (Signed)
 Patient has been scheduled for follow-up visit per 12/24/2023 LOS.  Pt given an appt calendar with date and time.

## 2023-12-24 NOTE — Progress Notes (Incomplete)
 [facility] at [facility] [address] [place],  [place]  [address] [phone]  Clinic Day:  12/24/2023  Referring physician: Bert Ozell BIRCH., MD   HISTORY OF PRESENT ILLNESS:  Whitney [name] is a 78 y.o. female who I was asked to consult upon for newly diagnosed breast cancer.  Her history dates back to *** when ***.  A screening/diagnostic mammogram and ultrasound showed a ***.  A biopsy of this lesion was done, which came back positive for ***.  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  Receptor testing showed her tumor to be estrogen receptor positive, progesterone receptor positive, but Her2/neu receptor negative.  One sentinel lymph node was removed, which came back negative for nodal involvement.  The patient comes in today to go over her breast cancer surgical pathology and its implications.   Of note, her last mammogram was ***.  Her family history is pertinent for ***breast cancer.  There *** ovarian cancer PAST MEDICAL HISTORY:   Past Medical History:  Diagnosis Date  . Anxiety   . Arthritis   . Asthma   . Cancer (HCC)    skin cancer- face-   . Chronic kidney disease    CKD  . Complication of anesthesia   . Concussion   . Depression   . Diabetes mellitus without complication (HCC)   . GERD (gastroesophageal reflux disease)   . Hypertension   . IBS (irritable bowel syndrome)    diarrhea  . MRSA (methicillin resistant Staphylococcus aureus)    after skin cancer removed from face.  . Neuromuscular disorder (HCC)    Neuropathy both feet  . Neuropathy   . Pneumonia   . PONV (postoperative nausea and vomiting)    years ago  . PTSD (post-traumatic stress disorder)     PAST SURGICAL HISTORY:   Past Surgical History:  Procedure Laterality Date  . ABDOMINAL HYSTERECTOMY    . CARPAL TUNNEL RELEASE Bilateral   . CATARACT EXTRACTION Bilateral    . CHOLECYSTECTOMY    . DILATION AND CURETTAGE OF UTERUS    . LOWER EXTREMITY VENOGRAPHY N/A 01/10/2017   Procedure: LOWER EXTREMITY ASCENDING VENOGRAPHY;  Surgeon: Sheree Penne Bruckner, MD;  Location: [facility] INVASIVE CV LAB;  Service: Cardiovascular;  Laterality: N/A;  . OVARIAN CYST SURGERY    . PERIPHERAL VASCULAR BALLOON ANGIOPLASTY Right 01/10/2017   Procedure: PERIPHERAL VASCULAR BALLOON ANGIOPLASTY;  Surgeon: Sheree Penne Bruckner, MD;  Location: [facility] INVASIVE CV LAB;  Service: Cardiovascular;  Laterality: Right;  . ROTATOR CUFF REPAIR Bilateral   . SALPINGOOPHORECTOMY Bilateral   . SHOULDER ARTHROSCOPY WITH SUBACROMIAL DECOMPRESSION AND OPEN ROTATOR C Right 01/31/2013   Procedure: RIGHT SHOULDER ARTHROSCOPY WITH SUBACROMIAL DECOMPRESSION AND MINI OPEN ROTATOR CUFF REPAIR, OPEN BICEP TENDODESIS AND OPEN DISTAL CLAVICLE RESECTION;  Surgeon: Elspeth JONELLE Her, MD;  Location: [facility] OR;  Service: Orthopedics;  Laterality: Right;  . SHOULDER ARTHROSCOPY WITH SUBACROMIAL DECOMPRESSION AND OPEN ROTATOR C Left 11/28/2013   Procedure: LEFT SHOULDER ARTHROSCOPY WITH SUBACROMIAL DECOMPRESSION  BICEP TENODESIS MINI OPEN ROTATOR CUFF REPAIR, OPEN DISTAL CLAVICLE RESECTION ;  Surgeon: Elspeth JONELLE Her, MD;  Location: [facility] OR;  Service: Orthopedics;  Laterality: Left;  . TOTAL KNEE ARTHROPLASTY Left 10/21/2021   Procedure: TOTAL KNEE ARTHROPLASTY;  Surgeon: Her Kemps, MD;  Location: [facility] ORS;  Service: Orthopedics;  Laterality: Left;  . TOTAL KNEE ARTHROPLASTY Right 10/06/2022   Procedure: TOTAL KNEE  ARTHROPLASTY;  Surgeon: Kay Kemps, MD;  Location: [facility] ORS;  Service: Orthopedics;  Laterality: Right;  120  . TRIGGER FINGER RELEASE Right    4   . TRIGGER FINGER RELEASE Bilateral    thumbs  . TUBAL LIGATION      CURRENT MEDICATIONS:   Current Outpatient Medications  Medication Sig Dispense Refill  . Biotin 2.5 MG CAPS Take 2,500 mcg by mouth.    . Continuous Glucose Sensor (FREESTYLE LIBRE 14 DAY  SENSOR) MISC SMARTSIG:1 Each Topical Every 2 Weeks    . FLUAD 0.5 ML injection     . ondansetron  (ZOFRAN -ODT) 4 MG disintegrating tablet Take 4 mg by mouth every 6 (six) hours as needed.    SABRA SPIKEVAX syringe     . acetaminophen  (TYLENOL ) 650 MG CR tablet Take 650 mg by mouth every 8 (eight) hours as needed for pain.    . albuterol  (PROVENTIL  HFA;VENTOLIN  HFA) 108 (90 BASE) MCG/ACT inhaler Inhale 2 puffs into the lungs every 6 (six) hours as needed for wheezing or shortness of breath.     . atorvastatin  (LIPITOR) 40 MG tablet Take 40 mg by mouth at bedtime.    . Biotin w/ Vitamins C & E (HAIR/SKIN/NAILS PO) Take 1 tablet by mouth daily.    . buPROPion  (WELLBUTRIN  XL) 150 MG 24 hr tablet Take 150 mg by mouth daily.    . cetirizine (ZYRTEC) 10 MG tablet Take 10 mg by mouth daily.    . cholecalciferol  (VITAMIN D3) 25 MCG (1000 UNIT) tablet Take 1,000 Units by mouth daily.    . citalopram  (CELEXA ) 40 MG tablet Take 40 mg by mouth daily.    . clonazePAM  (KLONOPIN ) 1 MG tablet Take 1 mg by mouth 2 (two) times daily.    . Coenzyme Q10 (CO Q-10) 200 MG CAPS Take 200 mg by mouth daily.    . cyanocobalamin  (VITAMIN B12) 1000 MCG tablet Take 1,000 mcg by mouth daily.    . donepezil  (ARICEPT ) 5 MG tablet Take 5 mg by mouth at bedtime.    SABRA doxycycline (VIBRAMYCIN) 100 MG capsule Take 1 capsule by mouth 2 (two) times daily.    . Dulaglutide  (TRULICITY ) 3 MG/0.5ML SOPN Inject 3 mg into the skin every Wednesday.    . furosemide  (LASIX ) 20 MG tablet Take 20 mg by mouth daily.    . gabapentin  (NEURONTIN ) 300 MG capsule Take 300 mg by mouth in the morning and at bedtime.    . HYDROcodone -acetaminophen  (NORCO) 5-325 MG tablet Take 1-2 tablets by mouth every 4 (four) hours as needed for moderate pain or severe pain. 40 tablet 0  . ibandronate (BONIVA) 150 MG tablet     . icosapent Ethyl (VASCEPA) 1 g capsule     . Melatonin 10 MG TABS Take 10 mg by mouth at bedtime.    . methocarbamol  (ROBAXIN ) 500 MG tablet  Take 1 tablet (500 mg total) by mouth every 8 (eight) hours as needed for muscle spasms. 60 tablet 1  . nitroGLYCERIN  (NITROSTAT ) 0.4 MG SL tablet Place 0.4 mg under the tongue every 5 (five) minutes as needed for chest pain.    SABRA OIL BASE EX Apply 1 application topically 4 (four) times daily as needed (for pain.). CBD OIL    . olmesartan (BENICAR) 40 MG tablet Take 40 mg by mouth daily.    SABRA omeprazole (PRILOSEC) 40 MG capsule Take 40 mg by mouth daily.    . pioglitazone  (ACTOS ) 15 MG tablet Take 15 mg by  mouth daily.     No current facility-administered medications for this visit.    ALLERGIES:   Allergies  Allergen Reactions  . Talwin [Pentazocine] Nausea Only and Other (See Comments)    Confusion  . Buprenorphine Dermatitis  . Other     Blood Product Refusal   . Oxycodone    . Codeine Nausea And Vomiting and Other (See Comments)    disorientation  . Erythromycin Hives  . Morphine  And Codeine Rash    Blisters, pt states she can take this med in small doses   . Percocet [Oxycodone -Acetaminophen ] Itching    FAMILY HISTORY:   Family History  Problem Relation Age of Onset  . Heart attack Father   . Diabetes Other   . Cancer Other   . Hypertension Other     SOCIAL HISTORY:   reports that she has never smoked. She has been exposed to tobacco smoke. She has never used smokeless tobacco. She reports that she does not drink alcohol and does not use drugs.  REVIEW OF SYSTEMS:  Review of Systems - Oncology   PHYSICAL EXAM:  Blood pressure (!) 150/62, pulse 74, temperature 99.4 F (37.4 C), temperature source Oral, resp. rate 14, height 5' 3.5 (1.613 m), weight 187 lb 6.4 oz (85 kg), SpO2 99%. Wt Readings from Last 3 Encounters:  12/24/23 187 lb 6.4 oz (85 kg)  10/06/22 184 lb (83.5 kg)  09/26/22 189 lb (85.7 kg)   Body mass index is 32.68 kg/m. Performance status (ECOG): {CHL ONC [id number] Physical Exam  LABS:      Latest Ref Rng & Units 09/26/2022    3:14 PM  10/23/2021    4:10 AM 10/22/2021    3:55 AM  CBC  WBC 4.0 - 10.5 K/uL 6.4  8.1  8.8   Hemoglobin 12.0 - 15.0 g/dL 86.4  9.6  9.9   Hematocrit 36.0 - 46.0 % 42.6  31.0  33.0   Platelets 150 - 400 K/uL 206  148  144       Latest Ref Rng & Units 09/26/2022    3:14 PM 10/22/2021    3:55 AM 10/14/2021    3:06 PM  CMP  Glucose 70 - 99 mg/dL 819  686  875   BUN 8 - 23 mg/dL 15  33  23   Creatinine 0.44 - 1.00 mg/dL 8.77  8.23  8.44   Sodium 135 - 145 mmol/L 137  140  138   Potassium 3.5 - 5.1 mmol/L 4.2  4.0  4.5   Chloride 98 - 111 mmol/L 103  109  105   CO2 22 - 32 mmol/L 23  21  23    Calcium  8.9 - 10.3 mg/dL 9.2  8.6  9.2      No results found for: CEA1, CEA / No results found for: CEA1, CEA No results found for: PSA1 No results found for: CAN199 No results found for: CAN125  No results found for: TOTALPROTELP, ALBUMINELP, A1GS, A2GS, BETS, BETA2SER, GAMS, MSPIKE, SPEI No results found for: TIBC, FERRITIN, IRONPCTSAT No results found for: LDH  Review Flowsheet        No data to display           STUDIES:  No results found.   ASSESSMENT & PLAN:   Whitney [name] is a 78 y.o. female who I was asked to consult upon for newly diagnosed stage *** breast cancer, status post a lump/mastectomy in ***.     The patient understands all  the plans discussed today and is in agreement with them.  I do appreciate Lininger, Ozell BIRCH., MD for his new consult. so   Tayquan Gassman DELENA Kerns, MD

## 2023-12-25 ENCOUNTER — Telehealth: Payer: Self-pay

## 2023-12-25 ENCOUNTER — Telehealth: Payer: Self-pay | Admitting: Licensed Clinical Social Worker

## 2023-12-25 DIAGNOSIS — C50912 Malignant neoplasm of unspecified site of left female breast: Secondary | ICD-10-CM | POA: Insufficient documentation

## 2023-12-25 NOTE — Telephone Encounter (Signed)
 PAP: Patient assistance application for Trulicity  through [employer] has been mailed to pt's home address on file. Provider portion of application will be faxed to provider's office.  Provider portion will be faxed to Dr. Marcellus Baptist at [facility]

## 2023-12-25 NOTE — Telephone Encounter (Signed)
 Tumor board review -  Rock Gilbert meets National Comprehensive Cancer Network (NCCN) criteria for genetic testing for Hereditary Breast and Ovarian Cancer Syndrome based on her personal and family history of breast cancer. Referral to genetic counseling recommended.    Dena Cary, MS, [facility] Genetic Counselor [email or web address] Phone: [phone]

## 2023-12-27 ENCOUNTER — Other Ambulatory Visit: Payer: Self-pay

## 2023-12-27 NOTE — Progress Notes (Signed)
 The proposed treatment discussed in conference is for discussion purpose only and is not a binding recommendation.  The patients have not been physically examined, or presented with their treatment options.  Therefore, final treatment plans cannot be decided.

## 2024-01-03 NOTE — Telephone Encounter (Signed)
 Received provider portion of patient assistance application

## 2024-01-14 ENCOUNTER — Inpatient Hospital Stay: Admitting: Oncology

## 2024-01-14 NOTE — Progress Notes (Deleted)
 " [facility] [address] [place],  [place]  [address] [phone]  Clinic Day:  01/14/2024  Referring physician: Ina Marcellus RAMAN, MD   HISTORY OF PRESENT ILLNESS:  Samyukta Cura is a 78 y.o. female who I recently began seeing for stage IA (T1c N0 M0) hormone positive breast cancer, status post a left breast lumpectomy in October 2025.  She comes in today to go over her Oncotype test results and their implications.    was asked to consult upon for newly diagnosed breast cancer.  Her history dates back to September 2025 when she surprisingly felt a mass in her lower left breast.  She brought this to the attention of her primary care provider, who also performed a breast exam in the office.  As her exam was also abnormal, the patient was sent for a diagnostic mammogram and ultrasound in late September 2025.  The studies revealed a 1.8 cm lesion in the inferior aspect of her left breast.  A biopsy of this lesion was done, which came back positive for invasive ductal carcinoma.  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  Receptor testing showed her tumor to be estrogen receptor positive, progesterone receptor positive, but Her2/neu receptor negative.  One sentinel lymph node was removed, which came back negative for nodal involvement.  The Ki-67 score on her tissue was low at only 5%.  The patient comes in today to go over her breast cancer surgical pathology and its implications.   Of note, her last mammogram was in February 2023.  Her family history is pertinent for a maternal aunt and a 75 year old daughter who both died from breast cancer.  There is no family history of ovarian cancer.  Neither the patient nor anyone in her family has undergone previous genetic testing.  PAST MEDICAL HISTORY:   Past Medical History:  Diagnosis Date   Anxiety    Arthritis     Asthma    Cancer (HCC)    skin cancer- face-    Chronic kidney disease    CKD   Complication of anesthesia    Concussion    Depression    Diabetes mellitus without complication (HCC)    GERD (gastroesophageal reflux disease)    Hypertension    IBS (irritable bowel syndrome)    diarrhea   MRSA (methicillin resistant Staphylococcus aureus)    after skin cancer removed from face.   Neuromuscular disorder (HCC)    Neuropathy both feet   Neuropathy    Pneumonia    PONV (postoperative nausea and vomiting)    years ago   PTSD (post-traumatic stress disorder)     PAST SURGICAL HISTORY:   Past Surgical History:  Procedure Laterality Date   ABDOMINAL HYSTERECTOMY     CARPAL TUNNEL RELEASE Bilateral    CATARACT EXTRACTION Bilateral    CHOLECYSTECTOMY     DILATION AND CURETTAGE OF UTERUS     LOWER EXTREMITY VENOGRAPHY N/A 01/10/2017   Procedure: LOWER EXTREMITY ASCENDING VENOGRAPHY;  Surgeon: Sheree Penne Bruckner, MD;  Location: [facility] INVASIVE CV LAB;  Service: Cardiovascular;  Laterality: N/A;   OVARIAN CYST SURGERY     PERIPHERAL VASCULAR BALLOON ANGIOPLASTY Right 01/10/2017   Procedure: PERIPHERAL VASCULAR BALLOON ANGIOPLASTY;  Surgeon: Sheree Penne Bruckner, MD;  Location: [facility] INVASIVE CV LAB;  Service: Cardiovascular;  Laterality: Right;   ROTATOR CUFF REPAIR Bilateral  SALPINGOOPHORECTOMY Bilateral    SHOULDER ARTHROSCOPY WITH SUBACROMIAL DECOMPRESSION AND OPEN ROTATOR C Right 01/31/2013   Procedure: RIGHT SHOULDER ARTHROSCOPY WITH SUBACROMIAL DECOMPRESSION AND MINI OPEN ROTATOR CUFF REPAIR, OPEN BICEP TENDODESIS AND OPEN DISTAL CLAVICLE RESECTION;  Surgeon: Elspeth JONELLE Her, MD;  Location: [facility] OR;  Service: Orthopedics;  Laterality: Right;   SHOULDER ARTHROSCOPY WITH SUBACROMIAL DECOMPRESSION AND OPEN ROTATOR C Left 11/28/2013   Procedure: LEFT SHOULDER ARTHROSCOPY WITH SUBACROMIAL DECOMPRESSION  BICEP TENODESIS MINI OPEN ROTATOR CUFF REPAIR, OPEN DISTAL CLAVICLE RESECTION ;   Surgeon: Elspeth JONELLE Her, MD;  Location: [facility] OR;  Service: Orthopedics;  Laterality: Left;   TOTAL KNEE ARTHROPLASTY Left 10/21/2021   Procedure: TOTAL KNEE ARTHROPLASTY;  Surgeon: Her Kemps, MD;  Location: [facility] ORS;  Service: Orthopedics;  Laterality: Left;   TOTAL KNEE ARTHROPLASTY Right 10/06/2022   Procedure: TOTAL KNEE ARTHROPLASTY;  Surgeon: Her Kemps, MD;  Location: [facility] ORS;  Service: Orthopedics;  Laterality: Right;  120   TRIGGER FINGER RELEASE Right    4    TRIGGER FINGER RELEASE Bilateral    thumbs   TUBAL LIGATION      CURRENT MEDICATIONS:   Current Outpatient Medications  Medication Sig Dispense Refill   acetaminophen  (TYLENOL ) 650 MG CR tablet Take 650 mg by mouth every 8 (eight) hours as needed for pain.     albuterol  (PROVENTIL  HFA;VENTOLIN  HFA) 108 (90 BASE) MCG/ACT inhaler Inhale 2 puffs into the lungs every 6 (six) hours as needed for wheezing or shortness of breath.      atorvastatin  (LIPITOR) 40 MG tablet Take 40 mg by mouth at bedtime.     Biotin 2.5 MG CAPS Take 2,500 mcg by mouth.     Biotin w/ Vitamins C & E (HAIR/SKIN/NAILS PO) Take 1 tablet by mouth daily.     buPROPion  (WELLBUTRIN  XL) 150 MG 24 hr tablet Take 150 mg by mouth daily.     cetirizine (ZYRTEC) 10 MG tablet Take 10 mg by mouth daily.     cholecalciferol  (VITAMIN D3) 25 MCG (1000 UNIT) tablet Take 1,000 Units by mouth daily.     citalopram  (CELEXA ) 40 MG tablet Take 40 mg by mouth daily.     clonazePAM  (KLONOPIN ) 1 MG tablet Take 1 mg by mouth 2 (two) times daily.     Coenzyme Q10 (CO Q-10) 200 MG CAPS Take 200 mg by mouth daily.     Continuous Glucose Sensor (FREESTYLE LIBRE 14 DAY SENSOR) MISC SMARTSIG:1 Each Topical Every 2 Weeks     cyanocobalamin  (VITAMIN B12) 1000 MCG tablet Take 1,000 mcg by mouth daily.     donepezil  (ARICEPT ) 5 MG tablet Take 5 mg by mouth at bedtime.     doxycycline (VIBRAMYCIN) 100 MG capsule Take 1 capsule by mouth 2 (two) times daily.     Dulaglutide  (TRULICITY ) 3  MG/0.5ML SOPN Inject 3 mg into the skin every Wednesday.     FLUAD 0.5 ML injection      furosemide  (LASIX ) 20 MG tablet Take 20 mg by mouth daily.     gabapentin  (NEURONTIN ) 300 MG capsule Take 300 mg by mouth in the morning and at bedtime.     HYDROcodone -acetaminophen  (NORCO) 5-325 MG tablet Take 1-2 tablets by mouth every 4 (four) hours as needed for moderate pain or severe pain. 40 tablet 0   ibandronate (BONIVA) 150 MG tablet      icosapent Ethyl (VASCEPA) 1 g capsule      Melatonin 10 MG TABS Take 10 mg by mouth  at bedtime.     methocarbamol  (ROBAXIN ) 500 MG tablet Take 1 tablet (500 mg total) by mouth every 8 (eight) hours as needed for muscle spasms. 60 tablet 1   nitroGLYCERIN  (NITROSTAT ) 0.4 MG SL tablet Place 0.4 mg under the tongue every 5 (five) minutes as needed for chest pain.     OIL BASE EX Apply 1 application topically 4 (four) times daily as needed (for pain.). CBD OIL     olmesartan (BENICAR) 40 MG tablet Take 40 mg by mouth daily.     omeprazole (PRILOSEC) 40 MG capsule Take 40 mg by mouth daily.     ondansetron  (ZOFRAN -ODT) 4 MG disintegrating tablet Take 4 mg by mouth every 6 (six) hours as needed.     pioglitazone  (ACTOS ) 15 MG tablet Take 15 mg by mouth daily.     SPIKEVAX syringe      No current facility-administered medications for this visit.    ALLERGIES:   Allergies  Allergen Reactions   Talwin [Pentazocine] Nausea Only and Other (See Comments)    Confusion   Buprenorphine Dermatitis   Other     Blood Product Refusal    Oxycodone     Codeine Nausea And Vomiting and Other (See Comments)    disorientation   Erythromycin Hives   Morphine  And Codeine Rash    Blisters, pt states she can take this med in small doses    Percocet [Oxycodone -Acetaminophen ] Itching    FAMILY HISTORY:   Family History  Problem Relation Age of Onset   Heart attack Father    Diabetes Other    Cancer Other    Hypertension Other     SOCIAL HISTORY:  The patient was born  in [place], but was raised in [place] .  She currently lives in town with her husband of 62 years.  She has 3 children, 2 of whom are deceased.  She also has 3 grandchildren and 5 great-grandchildren.  She was a musician for over 8 years.  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  There is no history of alcoholism or tobacco abuse.  REVIEW OF SYSTEMS:  Review of Systems  Constitutional:  Positive for fatigue. Negative for fever.  HENT:   Negative for hearing loss and sore throat.   Eyes:  Negative for eye problems.  Respiratory:  Positive for cough. Negative for chest tightness and hemoptysis.   Cardiovascular:  Positive for palpitations. Negative for chest pain.  Gastrointestinal:  Negative for abdominal distention, abdominal pain, blood in stool, constipation, diarrhea, nausea and vomiting.  Endocrine: Negative for hot flashes.  Genitourinary:  Negative for difficulty urinating, dysuria, frequency, hematuria and nocturia.   Musculoskeletal:  Positive for gait problem. Negative for arthralgias, back pain and myalgias.  Skin: Negative.  Negative for itching and rash.  Neurological:  Positive for gait problem. Negative for dizziness, extremity weakness, headaches, light-headedness and numbness.  Hematological: Negative.   Psychiatric/Behavioral:  Positive for depression. Negative for suicidal ideas. The patient is nervous/anxious.   :  PHYSICAL EXAM:  There were no vitals taken for this visit. Wt Readings from Last 3 Encounters:  12/24/23 187 lb 6.4 oz (85 kg)  10/06/22 184 lb (83.5 kg)  09/26/22 189 lb (85.7 kg)   There is no height or weight on file to calculate BMI. Performance status (ECOG): 1 - Symptomatic but completely ambulatory Physical Exam Constitutional:      Appearance: Normal appearance.  HENT:  Mouth/Throat:     Pharynx: Oropharynx is clear. No oropharyngeal exudate.  Cardiovascular:     Rate and  Rhythm: Normal rate and regular rhythm.     Heart sounds: No murmur heard.    No friction rub. No gallop.  Pulmonary:     Breath sounds: Normal breath sounds.  Chest:  Breasts:    Right: No swelling, bleeding, inverted nipple, mass, nipple discharge or skin change.     Left: No swelling, bleeding, inverted nipple, mass, nipple discharge or skin change.     Comments: Wound dehiscence is seen in the lower/inferior aspects of her left breast from her recent lumpectomy.  It appears to be healing fairly well; no purulent discharge is seen. Abdominal:     General: Bowel sounds are normal. There is no distension.     Palpations: Abdomen is soft. There is no mass.     Tenderness: There is no abdominal tenderness.  Musculoskeletal:        General: No tenderness.     Cervical back: Normal range of motion and neck supple.     Right lower leg: No edema.     Left lower leg: No edema.  Lymphadenopathy:     Cervical: No cervical adenopathy.     Right cervical: No superficial, deep or posterior cervical adenopathy.    Left cervical: No superficial, deep or posterior cervical adenopathy.     Upper Body:     Right upper body: No supraclavicular or axillary adenopathy.     Left upper body: No supraclavicular or axillary adenopathy.     Lower Body: No right inguinal adenopathy. No left inguinal adenopathy.  Skin:    Coloration: Skin is not jaundiced.     Findings: No lesion or rash.  Neurological:     General: No focal deficit present.     Mental Status: She is alert and oriented to person, place, and time. Mental status is at baseline.  Psychiatric:        Mood and Affect: Mood normal.        Behavior: Behavior normal.        Thought Content: Thought content normal.        Judgment: Judgment normal.    LABS:      Latest Ref Rng & Units 09/26/2022    3:14 PM 10/23/2021    4:10 AM 10/22/2021    3:55 AM  CBC  WBC 4.0 - 10.5 K/uL 6.4  8.1  8.8   Hemoglobin 12.0 - 15.0 g/dL 86.4  9.6  9.9    Hematocrit 36.0 - 46.0 % 42.6  31.0  33.0   Platelets 150 - 400 K/uL 206  148  144       Latest Ref Rng & Units 09/26/2022    3:14 PM 10/22/2021    3:55 AM 10/14/2021    3:06 PM  CMP  Glucose 70 - 99 mg/dL 819  686  875   BUN 8 - 23 mg/dL 15  33  23   Creatinine 0.44 - 1.00 mg/dL 8.77  8.23  8.44   Sodium 135 - 145 mmol/L 137  140  138   Potassium 3.5 - 5.1 mmol/L 4.2  4.0  4.5   Chloride 98 - 111 mmol/L 103  109  105   CO2 22 - 32 mmol/L 23  21  23    Calcium  8.9 - 10.3 mg/dL 9.2  8.6  9.2    ASSESSMENT & PLAN:   Tiffanie Blassingame is a  78 y.o. female who I was asked to consult upon for newly diagnosed stage IA (T1c N0 M0) hormone positive breast cancer, status post a left breast lumpectomy in October 2025.  In clinic today, I went over all of her breast cancer pathology with her.  As her disease is hormone receptor positive, sentinel lymph node negative, and greater than 1 cm in size, I will have her breast cancer tissue sent for Oncotype testing to determine how high her risk is of developing distant breast cancer recurrence within the next decade.  The patient understands that if Oncotype testing shows her to have a high risk of distant disease recurrence over the next 10 years, I would recommend adjuvant chemotherapy.  Otherwise, she would just be treated with 5 years of endocrine therapy only.  I will see this patient back in 3 weeks to go over her Oncotype test results and their implications.  The patient understands all the plans discussed today and is in agreement with them.  I do appreciate Ina Marcellus RAMAN, MD for his new consult. so   Tsutomu Barfoot DELENA Kerns, MD       "

## 2024-01-25 NOTE — Telephone Encounter (Signed)
 PAP: Application for Trulicity has been submitted to [employer], via fax

## 2024-01-31 ENCOUNTER — Inpatient Hospital Stay: Admitting: Oncology

## 2024-01-31 NOTE — Telephone Encounter (Signed)
 PAP: Patient assistance application for Trulicity  has been approved by PAP Companies: [employer] from 01/29/2024 to 01/22/2025. Medication should be delivered to PAP Delivery: Home. For further shipping updates, please contact [employer] at [phone]. Patient ID is: Waiting on approval letter

## 2024-02-01 ENCOUNTER — Inpatient Hospital Stay: Attending: Oncology | Admitting: Oncology

## 2024-02-01 VITALS — BP 123/53 | HR 71 | Temp 97.8°F | Resp 14 | Ht 63.5 in | Wt 189.0 lb

## 2024-02-01 DIAGNOSIS — C50512 Malignant neoplasm of lower-outer quadrant of left female breast: Secondary | ICD-10-CM | POA: Diagnosis not present

## 2024-02-01 DIAGNOSIS — Z170 Estrogen receptor positive status [ER+]: Secondary | ICD-10-CM | POA: Diagnosis not present

## 2024-02-01 DIAGNOSIS — Z79811 Long term (current) use of aromatase inhibitors: Secondary | ICD-10-CM | POA: Diagnosis not present

## 2024-02-01 DIAGNOSIS — C50912 Malignant neoplasm of unspecified site of left female breast: Secondary | ICD-10-CM | POA: Insufficient documentation

## 2024-02-01 NOTE — Progress Notes (Unsigned)
 " [facility] at [facility] [address] [place],  [place]  [address] [phone]  Clinic Day:  02/01/2024  Referring physician: Ina Marcellus RAMAN, MD   HISTORY OF PRESENT ILLNESS:  Whitney [name] is a 79 y.o. female who I recently began seeing for stage IA (T1c N0 M0) hormone positive breast cancer, status post a left breast lumpectomy in October 2025.  She comes in today to go over her Oncotype test results and their implications.    was asked to consult upon for newly diagnosed breast cancer.  Her history dates back to September 2025 when she surprisingly felt a mass in her lower left breast.  She brought this to the attention of her primary care provider, who also performed a breast exam in the office.  As her exam was also abnormal, the patient was sent for a diagnostic mammogram and ultrasound in late September 2025.  The studies revealed a 1.8 cm lesion in the inferior aspect of her left breast.  A biopsy of this lesion was done, which came back positive for invasive ductal carcinoma.  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  Receptor testing showed her tumor to be estrogen receptor positive, progesterone receptor positive, but Her2/neu receptor negative.  One sentinel lymph node was removed, which came back negative for nodal involvement.  The Ki-67 score on her tissue was low at only 5%.  The patient comes in today to go over her breast cancer surgical pathology and its implications.   Of note, her last mammogram was in February 2023.  Her family history is pertinent for a maternal aunt and a 10 year old daughter who both died from breast cancer.  There is no family history of ovarian cancer.  Neither the patient nor anyone in her family has undergone previous genetic testing.  PAST MEDICAL HISTORY:   Past Medical History:  Diagnosis Date   Anxiety    Arthritis     Asthma    Cancer (HCC)    skin cancer- face-    Chronic kidney disease    CKD   Complication of anesthesia    Concussion    Depression    Diabetes mellitus without complication (HCC)    GERD (gastroesophageal reflux disease)    Hypertension    IBS (irritable bowel syndrome)    diarrhea   MRSA (methicillin resistant Staphylococcus aureus)    after skin cancer removed from face.   Neuromuscular disorder (HCC)    Neuropathy both feet   Neuropathy    Pneumonia    PONV (postoperative nausea and vomiting)    years ago   PTSD (post-traumatic stress disorder)     PAST SURGICAL HISTORY:   Past Surgical History:  Procedure Laterality Date   ABDOMINAL HYSTERECTOMY     CARPAL TUNNEL RELEASE Bilateral    CATARACT EXTRACTION Bilateral    CHOLECYSTECTOMY     DILATION AND CURETTAGE OF UTERUS     LOWER EXTREMITY VENOGRAPHY N/A 01/10/2017   Procedure: LOWER EXTREMITY ASCENDING VENOGRAPHY;  Surgeon: Sheree Penne Bruckner, MD;  Location: [facility] INVASIVE CV LAB;  Service: Cardiovascular;  Laterality: N/A;   OVARIAN CYST SURGERY     PERIPHERAL VASCULAR BALLOON ANGIOPLASTY Right 01/10/2017   Procedure: PERIPHERAL VASCULAR BALLOON ANGIOPLASTY;  Surgeon: Sheree Penne Bruckner, MD;  Location: [facility]VASIVE CV LAB;  Service: Cardiovascular;  Laterality: Right;   ROTATOR CUFF REPAIR Bilateral  SALPINGOOPHORECTOMY Bilateral    SHOULDER ARTHROSCOPY WITH SUBACROMIAL DECOMPRESSION AND OPEN ROTATOR C Right 01/31/2013   Procedure: RIGHT SHOULDER ARTHROSCOPY WITH SUBACROMIAL DECOMPRESSION AND MINI OPEN ROTATOR CUFF REPAIR, OPEN BICEP TENDODESIS AND OPEN DISTAL CLAVICLE RESECTION;  Surgeon: Elspeth JONELLE Her, MD;  Location: [facility] OR;  Service: Orthopedics;  Laterality: Right;   SHOULDER ARTHROSCOPY WITH SUBACROMIAL DECOMPRESSION AND OPEN ROTATOR C Left 11/28/2013   Procedure: LEFT SHOULDER ARTHROSCOPY WITH SUBACROMIAL DECOMPRESSION  BICEP TENODESIS MINI OPEN ROTATOR CUFF REPAIR, OPEN DISTAL CLAVICLE RESECTION ;   Surgeon: Elspeth JONELLE Her, MD;  Location: [facility] OR;  Service: Orthopedics;  Laterality: Left;   TOTAL KNEE ARTHROPLASTY Left 10/21/2021   Procedure: TOTAL KNEE ARTHROPLASTY;  Surgeon: Her Kemps, MD;  Location: [facility] ORS;  Service: Orthopedics;  Laterality: Left;   TOTAL KNEE ARTHROPLASTY Right 10/06/2022   Procedure: TOTAL KNEE ARTHROPLASTY;  Surgeon: Her Kemps, MD;  Location: [facility] ORS;  Service: Orthopedics;  Laterality: Right;  120   TRIGGER FINGER RELEASE Right    4    TRIGGER FINGER RELEASE Bilateral    thumbs   TUBAL LIGATION      CURRENT MEDICATIONS:   Current Outpatient Medications  Medication Sig Dispense Refill   acetaminophen  (TYLENOL ) 650 MG CR tablet Take 650 mg by mouth every 8 (eight) hours as needed for pain.     albuterol  (PROVENTIL  HFA;VENTOLIN  HFA) 108 (90 BASE) MCG/ACT inhaler Inhale 2 puffs into the lungs every 6 (six) hours as needed for wheezing or shortness of breath.      atorvastatin  (LIPITOR) 40 MG tablet Take 40 mg by mouth at bedtime.     Biotin 2.5 MG CAPS Take 2,500 mcg by mouth.     Biotin w/ Vitamins C & E (HAIR/SKIN/NAILS PO) Take 1 tablet by mouth daily.     buPROPion  (WELLBUTRIN  XL) 150 MG 24 hr tablet Take 150 mg by mouth daily.     cetirizine (ZYRTEC) 10 MG tablet Take 10 mg by mouth daily.     cholecalciferol  (VITAMIN D3) 25 MCG (1000 UNIT) tablet Take 1,000 Units by mouth daily.     citalopram  (CELEXA ) 40 MG tablet Take 40 mg by mouth daily.     clonazePAM  (KLONOPIN ) 1 MG tablet Take 1 mg by mouth 2 (two) times daily.     Coenzyme Q10 (CO Q-10) 200 MG CAPS Take 200 mg by mouth daily.     Continuous Glucose Sensor (FREESTYLE LIBRE 14 DAY SENSOR) MISC SMARTSIG:1 Each Topical Every 2 Weeks     cyanocobalamin  (VITAMIN B12) 1000 MCG tablet Take 1,000 mcg by mouth daily.     donepezil  (ARICEPT ) 5 MG tablet Take 5 mg by mouth at bedtime.     doxycycline (VIBRAMYCIN) 100 MG capsule Take 1 capsule by mouth 2 (two) times daily.     Dulaglutide  (TRULICITY ) 3  MG/0.5ML SOPN Inject 3 mg into the skin every Wednesday.     FLUAD 0.5 ML injection      furosemide  (LASIX ) 20 MG tablet Take 20 mg by mouth daily.     gabapentin  (NEURONTIN ) 300 MG capsule Take 300 mg by mouth in the morning and at bedtime.     HYDROcodone -acetaminophen  (NORCO) 5-325 MG tablet Take 1-2 tablets by mouth every 4 (four) hours as needed for moderate pain or severe pain. 40 tablet 0   ibandronate (BONIVA) 150 MG tablet      icosapent Ethyl (VASCEPA) 1 g capsule      Melatonin 10 MG TABS Take 10 mg by mouth  at bedtime.     methocarbamol  (ROBAXIN ) 500 MG tablet Take 1 tablet (500 mg total) by mouth every 8 (eight) hours as needed for muscle spasms. 60 tablet 1   nitroGLYCERIN  (NITROSTAT ) 0.4 MG SL tablet Place 0.4 mg under the tongue every 5 (five) minutes as needed for chest pain.     OIL BASE EX Apply 1 application topically 4 (four) times daily as needed (for pain.). CBD OIL     olmesartan (BENICAR) 40 MG tablet Take 40 mg by mouth daily.     omeprazole (PRILOSEC) 40 MG capsule Take 40 mg by mouth daily.     ondansetron  (ZOFRAN -ODT) 4 MG disintegrating tablet Take 4 mg by mouth every 6 (six) hours as needed.     pioglitazone  (ACTOS ) 15 MG tablet Take 15 mg by mouth daily.     SPIKEVAX syringe      No current facility-administered medications for this visit.    ALLERGIES:   Allergies  Allergen Reactions   Talwin [Pentazocine] Nausea Only and Other (See Comments)    Confusion   Buprenorphine Dermatitis   Other     Blood Product Refusal    Oxycodone     Codeine Nausea And Vomiting and Other (See Comments)    disorientation   Erythromycin Hives   Morphine  And Codeine Rash    Blisters, pt states she can take this med in small doses    Percocet [Oxycodone -Acetaminophen ] Itching    FAMILY HISTORY:   Family History  Problem Relation Age of Onset   Heart attack Father    Diabetes Other    Cancer Other    Hypertension Other     SOCIAL HISTORY:  The patient was born  in [place], but was raised in [place] .  She currently lives in town with her husband of 62 years.  She has 3 children, 2 of whom are deceased.  She also has 3 grandchildren and 5 great-grandchildren.  She was a musician for over 8 years.  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  There is no history of alcoholism or tobacco abuse.  REVIEW OF SYSTEMS:  Review of Systems  Constitutional:  Positive for fatigue. Negative for fever.  HENT:   Negative for hearing loss and sore throat.   Eyes:  Negative for eye problems.  Respiratory:  Positive for cough. Negative for chest tightness and hemoptysis.   Cardiovascular:  Positive for palpitations. Negative for chest pain.  Gastrointestinal:  Negative for abdominal distention, abdominal pain, blood in stool, constipation, diarrhea, nausea and vomiting.  Endocrine: Negative for hot flashes.  Genitourinary:  Negative for difficulty urinating, dysuria, frequency, hematuria and nocturia.   Musculoskeletal:  Positive for gait problem. Negative for arthralgias, back pain and myalgias.  Skin: Negative.  Negative for itching and rash.  Neurological:  Positive for gait problem. Negative for dizziness, extremity weakness, headaches, light-headedness and numbness.  Hematological: Negative.   Psychiatric/Behavioral:  Positive for depression. Negative for suicidal ideas. The patient is nervous/anxious.   :  PHYSICAL EXAM:  There were no vitals taken for this visit. Wt Readings from Last 3 Encounters:  12/24/23 187 lb 6.4 oz (85 kg)  10/06/22 184 lb (83.5 kg)  09/26/22 189 lb (85.7 kg)   There is no height or weight on file to calculate BMI. Performance status (ECOG): 1 - Symptomatic but completely ambulatory Physical Exam Constitutional:      Appearance: Normal appearance.  HENT:  Mouth/Throat:     Pharynx: Oropharynx is clear. No oropharyngeal exudate.  Cardiovascular:     Rate and  Rhythm: Normal rate and regular rhythm.     Heart sounds: No murmur heard.    No friction rub. No gallop.  Pulmonary:     Breath sounds: Normal breath sounds.  Chest:  Breasts:    Right: No swelling, bleeding, inverted nipple, mass, nipple discharge or skin change.     Left: No swelling, bleeding, inverted nipple, mass, nipple discharge or skin change.     Comments: Wound dehiscence is seen in the lower/inferior aspects of her left breast from her recent lumpectomy.  It appears to be healing fairly well; no purulent discharge is seen. Abdominal:     General: Bowel sounds are normal. There is no distension.     Palpations: Abdomen is soft. There is no mass.     Tenderness: There is no abdominal tenderness.  Musculoskeletal:        General: No tenderness.     Cervical back: Normal range of motion and neck supple.     Right lower leg: No edema.     Left lower leg: No edema.  Lymphadenopathy:     Cervical: No cervical adenopathy.     Right cervical: No superficial, deep or posterior cervical adenopathy.    Left cervical: No superficial, deep or posterior cervical adenopathy.     Upper Body:     Right upper body: No supraclavicular or axillary adenopathy.     Left upper body: No supraclavicular or axillary adenopathy.     Lower Body: No right inguinal adenopathy. No left inguinal adenopathy.  Skin:    Coloration: Skin is not jaundiced.     Findings: No lesion or rash.  Neurological:     General: No focal deficit present.     Mental Status: She is alert and oriented to person, place, and time. Mental status is at baseline.  Psychiatric:        Mood and Affect: Mood normal.        Behavior: Behavior normal.        Thought Content: Thought content normal.        Judgment: Judgment normal.    LABS:      Latest Ref Rng & Units 09/26/2022    3:14 PM 10/23/2021    4:10 AM 10/22/2021    3:55 AM  CBC  WBC 4.0 - 10.5 K/uL 6.4  8.1  8.8   Hemoglobin 12.0 - 15.0 g/dL 86.4  9.6  9.9    Hematocrit 36.0 - 46.0 % 42.6  31.0  33.0   Platelets 150 - 400 K/uL 206  148  144       Latest Ref Rng & Units 09/26/2022    3:14 PM 10/22/2021    3:55 AM 10/14/2021    3:06 PM  CMP  Glucose 70 - 99 mg/dL 819  686  875   BUN 8 - 23 mg/dL 15  33  23   Creatinine 0.44 - 1.00 mg/dL 8.77  8.23  8.44   Sodium 135 - 145 mmol/L 137  140  138   Potassium 3.5 - 5.1 mmol/L 4.2  4.0  4.5   Chloride 98 - 111 mmol/L 103  109  105   CO2 22 - 32 mmol/L 23  21  23    Calcium  8.9 - 10.3 mg/dL 9.2  8.6  9.2    ASSESSMENT & PLAN:   Ly Wass is a  79 y.o. female who I was asked to consult upon for newly diagnosed stage IA (T1c N0 M0) hormone positive breast cancer, status post a left breast lumpectomy in October 2025.  In clinic today, I went over all of her breast cancer pathology with her.  As her disease is hormone receptor positive, sentinel lymph node negative, and greater than 1 cm in size, I will have her breast cancer tissue sent for Oncotype testing to determine how high her risk is of developing distant breast cancer recurrence within the next decade.  The patient understands that if Oncotype testing shows her to have a high risk of distant disease recurrence over the next 10 years, I would recommend adjuvant chemotherapy.  Otherwise, she would just be treated with 5 years of endocrine therapy only.  I will see this patient back in 3 weeks to go over her Oncotype test results and their implications.  The patient understands all the plans discussed today and is in agreement with them.   I do appreciate Ina Marcellus RAMAN, MD for his new consult. so   Adalid Beckmann DELENA Kerns, MD       "

## 2024-05-30 ENCOUNTER — Inpatient Hospital Stay: Admitting: Oncology
